# Patient Record
Sex: Female | Born: 1970 | Race: White | Hispanic: No | Marital: Married | Smoking: Never smoker
Health system: Southern US, Community
[De-identification: ages and names within clinical notes are randomized; demographics above are authoritative.]

## PROBLEM LIST (undated history)

## (undated) DIAGNOSIS — I471 Supraventricular tachycardia: Secondary | ICD-10-CM

## (undated) DIAGNOSIS — G4730 Sleep apnea, unspecified: Secondary | ICD-10-CM

## (undated) DIAGNOSIS — M1990 Unspecified osteoarthritis, unspecified site: Secondary | ICD-10-CM

## (undated) DIAGNOSIS — I10 Essential (primary) hypertension: Secondary | ICD-10-CM

## (undated) DIAGNOSIS — I4719 Other supraventricular tachycardia: Secondary | ICD-10-CM

## (undated) DIAGNOSIS — I447 Left bundle-branch block, unspecified: Secondary | ICD-10-CM

## (undated) DIAGNOSIS — E669 Obesity, unspecified: Secondary | ICD-10-CM

## (undated) DIAGNOSIS — I5032 Chronic diastolic (congestive) heart failure: Secondary | ICD-10-CM

## (undated) DIAGNOSIS — K219 Gastro-esophageal reflux disease without esophagitis: Secondary | ICD-10-CM

## (undated) DIAGNOSIS — K37 Unspecified appendicitis: Secondary | ICD-10-CM

## (undated) HISTORY — PX: ANTERIOR CRUCIATE LIGAMENT REPAIR: SHX115

## (undated) HISTORY — DX: Other supraventricular tachycardia: I47.19

## (undated) HISTORY — DX: Chronic diastolic (congestive) heart failure: I50.32

## (undated) HISTORY — DX: Sleep apnea, unspecified: G47.30

## (undated) HISTORY — PX: TONSILLECTOMY: SUR1361

## (undated) HISTORY — DX: Essential (primary) hypertension: I10

## (undated) HISTORY — DX: Left bundle-branch block, unspecified: I44.7

## (undated) HISTORY — DX: Supraventricular tachycardia: I47.1

## (undated) HISTORY — DX: Unspecified appendicitis: K37

## (undated) HISTORY — DX: Obesity, unspecified: E66.9

---

## 2000-06-12 ENCOUNTER — Encounter (INDEPENDENT_AMBULATORY_CARE_PROVIDER_SITE_OTHER): Payer: Self-pay | Admitting: Specialist

## 2000-06-12 ENCOUNTER — Ambulatory Visit (HOSPITAL_BASED_OUTPATIENT_CLINIC_OR_DEPARTMENT_OTHER): Admission: RE | Admit: 2000-06-12 | Discharge: 2000-06-12 | Payer: Self-pay | Admitting: *Deleted

## 2007-02-16 ENCOUNTER — Other Ambulatory Visit: Payer: Self-pay

## 2007-02-16 ENCOUNTER — Emergency Department: Payer: Self-pay | Admitting: Emergency Medicine

## 2007-02-18 ENCOUNTER — Ambulatory Visit: Payer: Self-pay | Admitting: Internal Medicine

## 2007-04-24 HISTORY — PX: ABLATION OF DYSRHYTHMIC FOCUS: SHX254

## 2010-09-05 NOTE — Progress Notes (Signed)
HEALTHCARE                  [facility]' OFFICE NOTE   NAME:[name] [name]                         MRN:          [id number]  DATE:02/18/2007                            DOB:          1970/07/20    [name] is referred today by Dr. Orson Gear from the [facility] office and Dr. Angelina Sheriff.  The patient is referred  today for evaluation of SVT.   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  Typically, these episodes last as long as 6-8  hours in duration, although sometimes they last only for a couple of  hours.  The patient was awakened by heart racing several days ago and  after the spells lasted for over five hours, she presented to the  emergency department, where she was found to be in SVT at a rate of 132  beats per minute.  She was initially treated with Adenosine, and  although I do not have all of the reports, it sounded like it was not  effective in terminating her tachycardia.  Ultimately, her heart racing  resolved, and she was discharged home.  The patient, because of high  blood pressure, in the past, and because of palpitations, has been on  Corazide.  She has not noted any particular improvement but has had  increased cough on this medication.  She also notes a remote history of  cough on ACE inhibitors, which were given to her secondary to  hypertension in the past.  She has never had syncope with this.  When  she goes into SVT, she feels some chest pressure and shortness of  breath, otherwise she has been stable.   The patient's past medical history is notable for remote tonsillectomy  and anterior cruciate ligament replacement on the  right back in 2005.   MEDICATIONS:  Corazide 20 mg daily.   FAMILY HISTORY:  Notable for a mother with hypertension, diabetes,  dyslipidemia, and now has multiple myeloma.  She has a father with an  aneurysm, which is being observed and followed.   SOCIAL HISTORY:  The patient is divorced.  She has one child.  She works  as a Radiation protection practitioner in [employer]  She denies tobacco use, recreational  drug use, or alcoholic beverages.   REVIEW OF SYSTEMS:  Really negative except for this chronic cough.  She  has longstanding obesity and has been overweight for many, many years.  Specifically, she denies loud snoring or difficulty with sleeping or  early morning fatigue.  The rest of her review of systems were negative.   PHYSICAL EXAMINATION:  She is a pleasant, obese 40 year old woman in no  acute distress.  Blood pressure today was 132/86.  The pulse was 72 and regular.  Respirations were 16.  The weight was 301 pounds.  HEENT:  Normocephalic and atraumatic.  Pupils are  equal and round.  Oropharynx is moist.  Sclerae are anicteric.  NECK:  No jugular venous distention.  There is no thyromegaly.  Trachea  is midline.  Carotids are 2+ and symmetric.  LUNGS:  Clear bilaterally to auscultation.  There are no wheezes, rales  or rhonchi present.  There is no increased work of breathing.  CARDIOVASCULAR:  Regular rate and rhythm with a normal S1 and S2.  The  PMI was not enlarged, nor was it laterally displaced.  ABDOMEN:  Soft, nontender, nondistended.  It was obese.  There was no  obvious organomegaly.  EXTREMITIES:  No clubbing, cyanosis or edema.  Pulses were 2+ and  symmetric.  NEUROLOGIC:  Alert and oriented x3.  Cranial nerves are intact.  Strength is 5/5 and symmetric.   EKG demonstrates sinus rhythm with normal axis and intervals.  Prior ECG  demonstrates atrial tachycardia with positive P wave morphology in lead  I, lead V1, and lead II.  Lead F was iso-electric.  Lead aVL was   positive.   IMPRESSION:  1. Recurrent episodes of supraventricular tachycardia.  2. Hypertension.  3. Obesity.   DISCUSSION:  I suspect that the patient's tachycardia is coming from the  high atrium or maybe the mid lateral right atrium.  She has not been on  much in the way of treatment for tachycardic palpitations today.  I have  asked that she switch her medications from the Corzide to Toprol XL 50  mg 1/2 tablet for one week, then a whole tablet daily.  Additional anti-  arrhythmic drugs would certainly be a consideration to this patient, as  would be catheter ablation.  I spent a fair amount of time today talking  about the risks, benefits, goals, and expectations of EP study and  catheter ablation.  For now, would recommend medical therapy alone to  see how she does.  I have also asked that she maintain a low sodium  diet.  I will plan to see the patient back in the office in three  months, sooner should she have worsening symptoms.     Doylene Canning. Ladona Ridgel, MD  Electronically Signed    GWT/MedQ  DD: 02/18/2007  DT: 02/18/2007  Job #: [id number]   cc:   Elease Hashimoto A. Benedetto Goad, M.D.  Rollene Rotunda, MD, [facility]

## 2010-09-05 NOTE — Letter (Signed)
February 18, 2007    Elease Hashimoto A. Benedetto Goad, M.D.  [address]  [place]  [place] [address]   RE:  [name], [name]  MRN:  [id number]  /  DOB:  07-25-1970   Dr. 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  The  patient has had palpitations off and on very infrequently and remotely  for the last several years but in the last several months has had  increasingly frequent episodes of tachy palpitations and was recently  documented to have SVT (atrial tachycardia) at rates of 130-140 beats  per minute.  Today we saw her in the office.  She has been on low-dose  beta blocker in conjunction with diuretics for this.  She continues to  have occasions symptoms.  Her exam was otherwise unremarkable except for  obesity and her EKG demonstrates intermittent episodes of atrial  tachycardia.   I discussed treatment options with the patient.  I have recommended for  now that we continue her on her beta blocker though we also discussed  the possibility of cath ablation if her  symptoms worsen of if she does not respond to medical therapy alone.  I  plan to see her back in several months, sooner should her symptoms  worsen.  I will keep you apprised as to how we deal with the patient.    Sincerely,      Doylene Canning. Ladona Ridgel, MD  Electronically Signed    GWT/MedQ  DD: 02/18/2007  DT: 02/18/2007  Job #: [id number]   CC:    Rollene Rotunda, MD, [facility]

## 2010-09-08 NOTE — Op Note (Signed)
[facility]  Patient:    [name] [name], [name] [name]                         MRN: [id number] Proc. Date: 06/12/00 Adm. Date:  [id number] Attending:  Aundria Mems                           Operative Report  PREOPERATIVE DIAGNOSIS:  Chronic obstructive hyperplastic adenotonsillitis.  POSTOPERATIVE DIAGNOSIS:  Chronic obstructive hyperplastic adenotonsillitis.  PROCEDURE:  Adenotonsillectomy.  DESCRIPTION OF PROCEDURE:  With the patient under general orotracheal anesthesia, the Crowe-Davis mouth gag was inserted and the patient put in the Haines position.  Inspection of the oral cavity revealed 4+ enlarged tonsils, nearly kissing in the midline with the mouth gag in place.  The soft palate was normal configuration.  The hard palate was intact to palpation.  Tonsils were nonpulsatile to palpation.  Mirror visualization of the nasopharynx revealed moderate adenoid tissue present, but decision was made to ablate the adenoid tissue with electrocautery rather than removal by curettage.  The left tonsil was grasped at the superior pole and removed from the fossa by electrical dissection, obtaining complete hemostasis with electrocautery.  The right tonsil was removed in similar fashion.  Red rubber catheter was passed through the left nasal chamber and used to elevate the soft palate.  Under mirror visualization with suction cautery, moderate adenoid tissue was completely ablated with suction cauterization and maintaining naturally complete hemostasis.  At completion of the case, the patient had prominent posterior pillars attaching to the base of the uvula bilaterally.  Given her body habitus and snoring and probable strong tendency toward sleep apnea, the triangular section of the upper posterior pillar muscle was excised, widening the free margin of the soft palate and widening the pharyngeal inlet between the palatopharyngeal pillars.  Blood loss for the procedure is  less than 10 cc.  The patient tolerated the procedure well, was taken to the recovery room in stable general condition. DD:  06/12/00 TD:  06/12/00 Job: [id number]

## 2011-12-17 ENCOUNTER — Encounter: Payer: Self-pay | Admitting: Cardiovascular Disease

## 2011-12-17 ENCOUNTER — Ambulatory Visit (INDEPENDENT_AMBULATORY_CARE_PROVIDER_SITE_OTHER): Payer: PRIVATE HEALTH INSURANCE | Admitting: Cardiovascular Disease

## 2011-12-17 VITALS — BP 160/108 | HR 87 | Ht 63.0 in | Wt 344.5 lb

## 2011-12-17 DIAGNOSIS — I471 Supraventricular tachycardia: Secondary | ICD-10-CM

## 2011-12-17 DIAGNOSIS — R002 Palpitations: Secondary | ICD-10-CM

## 2011-12-17 DIAGNOSIS — I4719 Other supraventricular tachycardia: Secondary | ICD-10-CM

## 2011-12-17 DIAGNOSIS — R0602 Shortness of breath: Secondary | ICD-10-CM

## 2011-12-17 DIAGNOSIS — I447 Left bundle-branch block, unspecified: Secondary | ICD-10-CM

## 2011-12-17 NOTE — Patient Instructions (Addendum)
Your physician has requested that you have an echocardiogram. Echocardiography is a painless test that uses sound waves to create images of your heart. It provides your doctor with information about the size and shape of your heart and how well your heart's chambers and valves are working. This procedure takes approximately one hour. There are no restrictions for this procedure.  Your physician has recommended that you wear an event monitor. Event monitors are medical devices that record the heart's electrical activity. Doctors most often us these monitors to diagnose arrhythmias. Arrhythmias are problems with the speed or rhythm of the heartbeat. The monitor is a small, portable device. You can wear one while you do your normal daily activities. This is usually used to diagnose what is causing palpitations/syncope (passing out).  Follow up after tests.  

## 2011-12-18 ENCOUNTER — Other Ambulatory Visit: Payer: Self-pay

## 2011-12-18 ENCOUNTER — Other Ambulatory Visit (INDEPENDENT_AMBULATORY_CARE_PROVIDER_SITE_OTHER): Payer: PRIVATE HEALTH INSURANCE

## 2011-12-18 DIAGNOSIS — R609 Edema, unspecified: Secondary | ICD-10-CM

## 2011-12-18 DIAGNOSIS — R0602 Shortness of breath: Secondary | ICD-10-CM

## 2011-12-19 ENCOUNTER — Encounter: Payer: Self-pay | Admitting: Cardiovascular Disease

## 2011-12-19 DIAGNOSIS — I471 Supraventricular tachycardia: Secondary | ICD-10-CM | POA: Insufficient documentation

## 2011-12-19 NOTE — Assessment & Plan Note (Signed)
The patient has a left bundle branch block which is concerning at such a young age. She had significant dyspnea and thus I will request an echocardiogram for evaluation.

## 2011-12-19 NOTE — Progress Notes (Signed)
HPI  This is a 41 year old female who is here today for evaluation of palpitations and an abnormal ECG. She lives at [facility] but works here with [employer]. She reports no history of tachycardia. In 2009 she was diagnosed with atrial tachycardia and underwent ablation by Dr. Chales Abrahams in [place]. According to the patient, the procedure failed and she was placed on flecainide, lisinopril and Toprol. 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 At that time, all her medications were stopped. She was told at that time that her echo and nuclear stress test were okay. She now reports frequent palpitations and fast heartbeats associated with significant dyspnea but no chest pain. One time while she was working, she corrected herself to a monitor while she was having palpitations. This was reviewed today and it appears that she was in sinus rhythm with left bundle branch block. She is morbidly obese with sleep apnea. She had recent labs performed which showed normal basic metabolic profile and electrolytes. Lipid profile showed a total cholesterol of 157 with an LDL of 86. Full thyroid panel was normal. Troponin was normal.  Allergies  Allergen Reactions  . Aspirin     swelling     Current Outpatient Prescriptions on File Prior to Visit  Medication Sig Dispense Refill  . omeprazole (PRILOSEC) 20 MG capsule Take 20 mg by mouth daily.         Past Medical History  Diagnosis Date  . Atrial tachycardia, paroxysmal     s/p ablation in 2009 by Dr. Chales Abrahams in [facility] (falied)  . LBBB (left bundle branch block)   . Obesity   . Sleep apnea      Past Surgical History  Procedure Date  . Tonsillectomy   . Anterior cruciate ligament repair   . Ablation of dysrhythmic focus 2009     Family History  Problem Relation Age of Onset  . Hyperlipidemia Mother   .  Hypertension Mother   . Hypertension Father      History   Social History  . Marital Status: Married    Spouse Name: N/A    Number of Children: N/A  . Years of Education: N/A   Occupational History  . Not on file.   Social History Main Topics  . Smoking status: Never Smoker   . Smokeless tobacco: Not on file  . Alcohol Use: No  . Drug Use: No  . Sexually Active:    Other Topics Concern  . Not on file   Social History Narrative  . No narrative on file     ROS Constitutional: Negative for fever, chills, diaphoresis, activity change, appetite change .  HENT: Negative for hearing loss, nosebleeds, congestion, sore throat, facial swelling, drooling, trouble swallowing, neck pain, voice change, sinus pressure and tinnitus.  Eyes: Negative for photophobia, pain, discharge and visual disturbance.  Respiratory: Negative for apnea, cough, chest tightness and wheezing.  Cardiovascular: Negative for chest pain  and leg swelling.  Gastrointestinal: Negative for nausea, vomiting, abdominal pain, diarrhea, constipation, blood in stool and abdominal distention.  Genitourinary: Negative for dysuria, urgency, frequency, hematuria and decreased urine volume.  Musculoskeletal: Negative for myalgias, back pain, joint swelling, arthralgias and gait problem.  Skin: Negative for color change, pallor, rash and wound.  Neurological: Negative for dizziness, tremors, seizures, syncope, speech difficulty, weakness, light-headedness, numbness and headaches.  Psychiatric/Behavioral: Negative  for suicidal ideas, hallucinations, behavioral problems and agitation. The patient is not nervous/anxious.     PHYSICAL EXAM   BP 160/108  Pulse 87  Ht 5\' 3"  (1.6 m)  Wt 344 lb 8 oz (156.264 kg)  BMI 61.03 kg/m2 Constitutional: She is oriented to person, place, and time. She appears well-developed and well-nourished. No distress.  HENT: No nasal discharge.  Head: Normocephalic and atraumatic.  Eyes:  Pupils are equal and round. Right eye exhibits no discharge. Left eye exhibits no discharge.  Neck: Normal range of motion. Neck supple. No JVD present. No thyromegaly present.  Cardiovascular: Normal rate, regular rhythm, normal heart sounds. Exam reveals no gallop and no friction rub. No murmur heard.  Pulmonary/Chest: Effort normal and breath sounds normal. No stridor. No respiratory distress. She has no wheezes. She has no rales. She exhibits no tenderness.  Abdominal: Soft. Bowel sounds are normal. She exhibits no distension. There is no tenderness. There is no rebound and no guarding.  Musculoskeletal: Normal range of motion. She exhibits no edema and no tenderness.  Neurological: She is alert and oriented to person, place, and time. Coordination normal.  Skin: Skin is warm and dry. No rash noted. She is not diaphoretic. No erythema. No pallor.  Psychiatric: She has a normal mood and affect. Her behavior is normal. Judgment and thought content normal.     EKG: Normal sinus rhythm with left bundle branch block.   ASSESSMENT AND PLAN

## 2011-12-19 NOTE — Assessment & Plan Note (Signed)
The patient has known history of atrial tachycardia with failed ablation in the past. She is currently having recurrent palpitations and tachycardia. These episodes are not happening on a daily basis. Thus, a Holter monitor most likely will be insufficient. Thus, I recommend a 14 day outpatient telemetry. The patient will likely require treatment with a beta blocker.

## 2011-12-21 ENCOUNTER — Telehealth: Payer: Self-pay

## 2011-12-21 NOTE — Telephone Encounter (Signed)
I rec'd t/c from [facility] re: abnormal tracing. They say pt shows SR with bigeminy x 34 beats. They have not contacted pt. [facility] will fax me tracings and I will call pt.

## 2011-12-21 NOTE — Telephone Encounter (Signed)
[facility] strips rev'd with Dr. Kirke Corin. No new orders rec'd. Bigeminy noted. No need to contact pt

## 2012-01-07 ENCOUNTER — Encounter: Payer: Self-pay | Admitting: Cardiovascular Disease

## 2012-01-07 ENCOUNTER — Ambulatory Visit (INDEPENDENT_AMBULATORY_CARE_PROVIDER_SITE_OTHER): Payer: PRIVATE HEALTH INSURANCE | Admitting: Cardiovascular Disease

## 2012-01-07 VITALS — BP 158/98 | HR 72 | Ht 63.0 in | Wt 342.8 lb

## 2012-01-07 DIAGNOSIS — I471 Supraventricular tachycardia, unspecified: Secondary | ICD-10-CM

## 2012-01-07 DIAGNOSIS — I10 Essential (primary) hypertension: Secondary | ICD-10-CM

## 2012-01-07 DIAGNOSIS — R002 Palpitations: Secondary | ICD-10-CM

## 2012-01-07 MED ORDER — CARVEDILOL 6.25 MG PO TABS: 6.2500 mg | ORAL_TABLET | Freq: Two times a day (BID) | ORAL | Status: DC

## 2012-01-07 NOTE — Progress Notes (Signed)
HPI  This is a 41 year old female who is here today for a followup visit regarding palpitations and an abnormal ECG. She lives at [facility] but works here with [employer]. She reports known history of tachycardia. In 2009 she was diagnosed with atrial tachycardia and underwent ablation by Dr. Chales Abrahams in [place]. According to the patient, the procedure failed and she was placed on flecainide, lisinopril and Toprol. 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 At that time, all her medications were stopped. She was told at that time that her echo and nuclear stress test were okay. She reported recent frequent palpitations and fast heartbeats associated with significant dyspnea but no chest pain. She had recent labs performed which showed normal basic metabolic profile and electrolytes. Lipid profile showed a total cholesterol of 157 with an LDL of 86. Full thyroid panel was normal. Troponin was normal. She had an echocardiogram done which showed normal LV systolic function with mild left ventricular hypertrophy. She had a 2 week outpatient telemetry which showed episodes of sinus tachycardia with occasional PACs and PVCs. No other significant arrhythmia was noted.  Allergies  Allergen Reactions  . Aspirin     swelling     Current Outpatient Prescriptions on File Prior to Visit  Medication Sig Dispense Refill  . omeprazole (PRILOSEC) 20 MG capsule Take 20 mg by mouth daily.      . carvedilol (COREG) 6.25 MG tablet Take 1 tablet (6.25 mg total) by mouth 2 (two) times daily.  60 tablet  6     Past Medical History  Diagnosis Date  . Atrial tachycardia, paroxysmal     s/p ablation in 2009 by Dr. Chales Abrahams in [facility] ([place])  . LBBB (left bundle branch block)   . Obesity   . Sleep apnea      Past Surgical History  Procedure Date  . Tonsillectomy   .  Anterior cruciate ligament repair   . Ablation of dysrhythmic focus 2009     Family History  Problem Relation Age of Onset  . Hyperlipidemia Mother   . Hypertension Mother   . Hypertension Father      History   Social History  . Marital Status: Married    Spouse Name: N/A    Number of Children: N/A  . Years of Education: N/A   Occupational History  . Not on file.   Social History Main Topics  . Smoking status: Never Smoker   . Smokeless tobacco: Not on file  . Alcohol Use: No  . Drug Use: No  . Sexually Active:    Other Topics Concern  . Not on file   Social History Narrative  . No narrative on file     PHYSICAL EXAM   BP 158/98  Pulse 72  Ht 5\' 3"  (1.6 m)  Wt 342 lb 12 oz (155.47 kg)  BMI 60.72 kg/m2  Constitutional: She is oriented to person, place, and time. She appears well-developed and well-nourished. No distress.  HENT: No nasal discharge.  Head: Normocephalic and atraumatic.  Eyes: Pupils are equal and round. Right eye exhibits no discharge. Left eye exhibits no discharge.  Neck: Normal range of motion. Neck supple. No JVD present. No thyromegaly present.  Cardiovascular: Normal rate, regular rhythm, normal heart sounds. Exam reveals no gallop and no friction rub. No murmur heard.  Pulmonary/Chest: Effort normal and  breath sounds normal. No stridor. No respiratory distress. She has no wheezes. She has no rales. She exhibits no tenderness.  Abdominal: Soft. Bowel sounds are normal. She exhibits no distension. There is no tenderness. There is no rebound and no guarding.  Musculoskeletal: Normal range of motion. She exhibits no edema and no tenderness.  Neurological: She is alert and oriented to person, place, and time. Coordination normal.  Skin: Skin is warm and dry. No rash noted. She is not diaphoretic. No erythema. No pallor.  Psychiatric: She has a normal mood and affect. Her behavior is normal. Judgment and thought content normal.    EKG:  Sinus  Rhythm  -Left bundle branch block.   ABNORMAL    ASSESSMENT AND PLAN

## 2012-01-07 NOTE — Assessment & Plan Note (Signed)
No evidence of atrial tachycardia on outpatient telemetry. She did have mild sinus tachycardia and occasional PACs and PVCs. Given that she appears to have hypertension, I will start treatment with carvedilol.

## 2012-01-07 NOTE — Assessment & Plan Note (Signed)
She appears to have essential hypertension. Her home blood pressure readings have been elevated as well. Echocardiogram showed normal LV systolic function with mild left ventricular hypertrophy. I am starting her on carvedilol 6.25 mg twice daily.

## 2012-01-07 NOTE — Patient Instructions (Addendum)
   Start Carvedilol 6.25 mg twice daily.   Follow up in 6 months.

## 2012-01-08 ENCOUNTER — Other Ambulatory Visit: Payer: Self-pay | Admitting: Cardiovascular Disease

## 2012-01-08 DIAGNOSIS — R002 Palpitations: Secondary | ICD-10-CM

## 2012-01-08 DIAGNOSIS — R0602 Shortness of breath: Secondary | ICD-10-CM

## 2012-01-21 ENCOUNTER — Other Ambulatory Visit: Payer: Self-pay

## 2012-01-21 ENCOUNTER — Telehealth: Payer: Self-pay | Admitting: Cardiovascular Disease

## 2012-01-21 DIAGNOSIS — R609 Edema, unspecified: Secondary | ICD-10-CM

## 2012-01-21 MED ORDER — FUROSEMIDE 20 MG PO TABS: 20.0000 mg | ORAL_TABLET | Freq: Every day | ORAL | Status: DC

## 2012-01-21 NOTE — Telephone Encounter (Signed)
She mentions being on triam/HCTZ in the past for fluid retention.  I told her I would discuss with Dr. Kirke Corin and call her back. Understanding verb.

## 2012-01-21 NOTE — Telephone Encounter (Signed)
Please see last 2 notes below and advise thanks

## 2012-01-21 NOTE — Telephone Encounter (Signed)
Pt calling with edema in her legs from her coreg. Pt also c/o SOB with excertion. BP however is running better.

## 2012-01-21 NOTE — Telephone Encounter (Signed)
Pt c/o increased edema since last week. Worked 24 hours last Thursday, edema got worse.  She stayed off her feet over w/e and edema improved only slightly. She says this is pitting.   She is also c/o DOE, noticed with the edema Only change is addition of coreg x 2 weeks ago Denies chest discomfort, but admits to some palpitations. 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

## 2012-01-21 NOTE — Telephone Encounter (Signed)
Pt informed Understanding verb New RX sent to pharm she asks if ok to have local lab do this at work on Monday 01/28/12. I advised ok. She will bring results to [place] next week. She will call us should her symptoms go unchanged/get worse

## 2012-01-21 NOTE — Telephone Encounter (Signed)
Start Lasix 20 mg once daily. Check BMP in 1 week.

## 2012-01-29 ENCOUNTER — Other Ambulatory Visit: Payer: Self-pay | Admitting: Cardiovascular Disease

## 2012-01-29 ENCOUNTER — Telehealth: Payer: Self-pay | Admitting: Cardiovascular Disease

## 2012-01-29 DIAGNOSIS — R609 Edema, unspecified: Secondary | ICD-10-CM

## 2012-01-29 NOTE — Telephone Encounter (Signed)
Pt worked every 4th day, 24 hour shifts. She asks if she can hold the lasix on those days she works. I advised this would be ok. She tells me lasix has helped edema and DOE. I gave her prelim. Lab results. She dropped these off today and they have been scanned in. Kidney fxn normal. Understanding verb.

## 2012-01-29 NOTE — Telephone Encounter (Signed)
Pt wants to know if she can cut back a [name] [name] on her lasix. 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

## 2012-06-07 ENCOUNTER — Other Ambulatory Visit: Payer: Self-pay

## 2012-07-17 ENCOUNTER — Ambulatory Visit (INDEPENDENT_AMBULATORY_CARE_PROVIDER_SITE_OTHER): Payer: PRIVATE HEALTH INSURANCE | Admitting: Cardiovascular Disease

## 2012-07-17 ENCOUNTER — Encounter: Payer: Self-pay | Admitting: Cardiovascular Disease

## 2012-07-17 VITALS — BP 120/86 | HR 73 | Ht 63.0 in | Wt 353.5 lb

## 2012-07-17 DIAGNOSIS — I471 Supraventricular tachycardia, unspecified: Secondary | ICD-10-CM

## 2012-07-17 DIAGNOSIS — I447 Left bundle-branch block, unspecified: Secondary | ICD-10-CM

## 2012-07-17 DIAGNOSIS — E669 Obesity, unspecified: Secondary | ICD-10-CM

## 2012-07-17 DIAGNOSIS — I10 Essential (primary) hypertension: Secondary | ICD-10-CM

## 2012-07-17 NOTE — Assessment & Plan Note (Signed)
Her blood pressure is now well controlled on current dose of carvedilol.

## 2012-07-17 NOTE — Progress Notes (Signed)
HPI  This is a 42 year old female who is here today for a followup visit regarding paroxysmal atrial tachycardia, hypertension and left bundle branch block. In 2009 she was diagnosed with atrial tachycardia and underwent ablation by Dr. Chales Abrahams in [place]. According to the patient, the procedure failed and she was placed on flecainide, lisinopril and Toprol. 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 At that time, all her medications were stopped. She was told at that time that her echo and nuclear stress test were okay.  She was seen last year for frequent palpitations associated with significant dyspnea but no chest pain. She had an echocardiogram done which showed normal LV systolic function with mild left ventricular hypertrophy. She had a 2 week outpatient telemetry which showed episodes of sinus tachycardia with occasional PACs and PVCs. No other significant arrhythmia was noted. I started her on treatment with carvedilol 6.25 mg twice daily. She had increased lower extremity edema the following weeks which responded to small dose furosemide 20 mg. She uses is now only as needed and has not required it in a while. She has been doing reasonably well. She denies any chest pain or dyspnea. She had one episode of tachycardia which lasted for about 10-15 minutes. She checked her heart rate with a pulse ox machine units was 150 beats per minute. The episode terminated spontaneously. No other episodes of tachycardia.  Allergies  Allergen Reactions  . Aspirin     swelling     Current Outpatient Prescriptions on File Prior to Visit  Medication Sig Dispense Refill  . carvedilol (COREG) 6.25 MG tablet Take 1 tablet (6.25 mg total) by mouth 2 (two) times daily.  60 tablet  6  . omeprazole (PRILOSEC) 20 MG capsule Take 20 mg by mouth daily.       No current facility-administered  medications on file prior to visit.     Past Medical History  Diagnosis Date  . Atrial tachycardia, paroxysmal     s/p ablation in 2009 by Dr. Chales Abrahams in [facility] (falied)  . LBBB (left bundle branch block)   . Obesity   . Sleep apnea   . Hypertension      Past Surgical History  Procedure Laterality Date  . Tonsillectomy    . Anterior cruciate ligament repair    . Ablation of dysrhythmic focus  2009     Family History  Problem Relation Age of Onset  . Hyperlipidemia Mother   . Hypertension Mother   . Hypertension Father      History   Social History  . Marital Status: Married    Spouse Name: N/A    Number of Children: N/A  . Years of Education: N/A   Occupational History  . Not on file.   Social History Main Topics  . Smoking status: Never Smoker   . Smokeless tobacco: Not on file  . Alcohol Use: No  . Drug Use: No  . Sexually Active:    Other Topics Concern  . Not on file   Social History Narrative  . No narrative on file     PHYSICAL EXAM   BP 120/86  Pulse 73  Ht 5\' 3"  (1.6 m)  Wt 353 lb 8 oz (160.347 kg)  BMI 62.64 kg/m2  Constitutional: She is oriented to person, place, and time. She appears well-developed and well-nourished. No distress.  HENT: No nasal discharge.  Head: Normocephalic and atraumatic.  Eyes: Pupils are equal and round. Right eye exhibits no discharge. Left eye exhibits no discharge.  Neck: Normal range of motion. Neck supple. No JVD present. No thyromegaly present.  Cardiovascular: Normal rate, regular rhythm, normal heart sounds. Exam reveals no gallop and no friction rub. No murmur heard.  Pulmonary/Chest: Effort normal and breath sounds normal. No stridor. No respiratory distress. She has no wheezes. She has no rales. She exhibits no tenderness.  Abdominal: Soft. Bowel sounds are normal. She exhibits no distension. There is no tenderness. There is no rebound and no guarding.  Musculoskeletal: Normal range of motion.  She exhibits no edema and no tenderness.  Neurological: She is alert and oriented to person, place, and time. Coordination normal.  Skin: Skin is warm and dry. No rash noted. She is not diaphoretic. No erythema. No pallor.  Psychiatric: She has a normal mood and affect. Her behavior is normal. Judgment and thought content normal.    EKG: Sinus  Rhythm  -Left bundle branch block.   ABNORMAL    ASSESSMENT AND PLAN

## 2012-07-17 NOTE — Assessment & Plan Note (Signed)
She only had one episode of tachycardia since her last visit. Continue treatment with carvedilol. If episodes of tachycardia become more frequent, I will consider an antiarrhythmic medication or referral to electrophysiology for possible ablation.

## 2012-07-17 NOTE — Patient Instructions (Addendum)
Continue same medications  Follow up in 1 year

## 2012-07-17 NOTE — Assessment & Plan Note (Signed)
Echocardiogram showed normal LV systolic function. I will consider repeat echocardiogram in one year.

## 2012-07-17 NOTE — Assessment & Plan Note (Signed)
I discussed with her the importance of weight loss. She is considering new diet. If that doesn't work, she might benefit from surgical weight loss.

## 2012-08-02 ENCOUNTER — Other Ambulatory Visit: Payer: Self-pay | Admitting: Cardiovascular Disease

## 2013-02-26 ENCOUNTER — Other Ambulatory Visit: Payer: Self-pay

## 2013-03-03 ENCOUNTER — Other Ambulatory Visit: Payer: Self-pay | Admitting: Cardiovascular Disease

## 2013-03-03 ENCOUNTER — Other Ambulatory Visit: Payer: Self-pay | Admitting: *Deleted

## 2013-03-03 MED ORDER — CARVEDILOL 6.25 MG PO TABS: ORAL_TABLET | ORAL | Status: DC

## 2013-03-03 NOTE — Telephone Encounter (Signed)
Requested Prescriptions   Signed Prescriptions Disp Refills  . carvedilol (COREG) 6.25 MG tablet 60 tablet 6    Sig: take 1 tablet by mouth twice a day    Authorizing Provider: Lorine Bears A    Ordering User: Kendrick Fries

## 2013-03-10 ENCOUNTER — Other Ambulatory Visit: Payer: Self-pay | Admitting: *Deleted

## 2013-03-10 ENCOUNTER — Other Ambulatory Visit: Payer: Self-pay | Admitting: Cardiovascular Disease

## 2013-03-10 MED ORDER — CARVEDILOL 6.25 MG PO TABS: ORAL_TABLET | ORAL | Status: DC

## 2013-03-10 NOTE — Telephone Encounter (Signed)
Requested Prescriptions   Signed Prescriptions Disp Refills  . carvedilol (COREG) 6.25 MG tablet 60 tablet 3    Sig: take 1 tablet by mouth twice a day    Authorizing Provider: Lorine Bears A    Ordering User: Kendrick Fries

## 2013-08-24 ENCOUNTER — Encounter: Payer: Self-pay | Admitting: Cardiovascular Disease

## 2013-08-24 ENCOUNTER — Ambulatory Visit (INDEPENDENT_AMBULATORY_CARE_PROVIDER_SITE_OTHER): Payer: PRIVATE HEALTH INSURANCE | Admitting: Cardiovascular Disease

## 2013-08-24 VITALS — BP 142/100 | HR 73 | Ht 63.0 in | Wt 360.2 lb

## 2013-08-24 DIAGNOSIS — I10 Essential (primary) hypertension: Secondary | ICD-10-CM

## 2013-08-24 DIAGNOSIS — I4719 Other supraventricular tachycardia: Secondary | ICD-10-CM

## 2013-08-24 DIAGNOSIS — I471 Supraventricular tachycardia: Secondary | ICD-10-CM

## 2013-08-24 DIAGNOSIS — I447 Left bundle-branch block, unspecified: Secondary | ICD-10-CM

## 2013-08-24 NOTE — Progress Notes (Signed)
HPI  This is a 43 year old female who is here today for a followup visit regarding paroxysmal atrial tachycardia, hypertension and left bundle branch block. In 2009 she was diagnosed with atrial tachycardia and underwent ablation by Dr. Chales Abrahamsyson in [place]. According to the patient, the procedure failed and she was placed on flecainide, lisinopril and Toprol. 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 At that time, all her medications were stopped.  She was seen in 2013 for frequent palpitations associated with significant dyspnea but no chest pain. She had an echocardiogram done which showed normal LV systolic function with mild left ventricular hypertrophy. She had a 2 week outpatient telemetry which showed episodes of sinus tachycardia with occasional PACs and PVCs. No other significant arrhythmia was noted. I started her on treatment with carvedilol for both hypertension and palpitations. She had increased lower extremity edema the following weeks which responded to small dose furosemide 20 mg. She uses is now only as needed and has not required it in a while.  She has been doing very well and denies any chest pain, worsening dyspnea or significant palpitations. She continues to struggle with obesity.   Allergies  Allergen Reactions  . Aspirin     swelling     Current Outpatient Prescriptions on File Prior to Visit  Medication Sig Dispense Refill  . carvedilol (COREG) 6.25 MG tablet take 1 tablet by mouth twice a day  60 tablet  3  . furosemide (LASIX) 20 MG tablet Take 20 mg by mouth as needed.      Marland Kitchen. omeprazole (PRILOSEC) 20 MG capsule Take 20 mg by mouth daily.       No current facility-administered medications on file prior to visit.     Past Medical History  Diagnosis Date  . Atrial tachycardia, paroxysmal     s/p ablation in 2009 [facility]  . LBBB (left bundle branch block)   . Obesity   . Sleep apnea   . Hypertension      Past Surgical History  Procedure Laterality Date  . Tonsillectomy    . Anterior cruciate ligament repair    . Ablation of dysrhythmic focus  2009     Family History  Problem Relation Age of Onset  . Hyperlipidemia Mother   . Hypertension Mother   . Hypertension Father      History   Social History  . Marital Status: Married    Spouse Name: N/A    Number of Children: N/A  . Years of Education: N/A   Occupational History  . Not on file.   Social History Main Topics  . Smoking status: Never Smoker   . Smokeless tobacco: Not on file  . Alcohol Use: No  . Drug Use: No  . Sexual Activity:    Other Topics Concern  . Not on file   Social History Narrative  . No narrative on file     PHYSICAL EXAM   BP 142/100  Pulse 73  Ht 5\' 3"  (1.6 m)  Wt 360 lb 4 oz (163.408 kg)  BMI 63.83 kg/m2  Constitutional: She is oriented to person, place, and time. She appears well-developed and well-nourished. No distress.  HENT: No nasal discharge.  Head: Normocephalic and atraumatic.  Eyes: Pupils are equal and round. Right eye exhibits no discharge. Left eye exhibits no discharge.  Neck: Normal range of motion. Neck supple. No JVD present. No thyromegaly present.  Cardiovascular: Normal rate, regular rhythm, normal heart sounds. Exam reveals no gallop and no friction rub. No murmur heard.  Pulmonary/Chest: Effort normal and breath sounds normal. No stridor. No respiratory distress. She has no wheezes. She has no rales. She exhibits no tenderness.  Abdominal: Soft. Bowel sounds are normal. She exhibits no distension. There is no tenderness. There is no rebound and no guarding.  Musculoskeletal: Normal range of motion. She exhibits no edema and no tenderness.  Neurological: She is alert and oriented to person, place, and time. Coordination normal.  Skin: Skin is warm and dry. No rash  noted. She is not diaphoretic. No erythema. No pallor.  Psychiatric: She has a normal mood and affect. Her behavior is normal. Judgment and thought content normal.    EKG: Sinus  Rhythm  -Left bundle branch block.   ABNORMAL    ASSESSMENT AND PLAN

## 2013-08-24 NOTE — Patient Instructions (Signed)
Continue same medications.   Your physician wants you to follow-up in: 12 months.  You will receive a reminder letter in the mail two months in advance. If you don't receive a letter, please call our office to schedule the follow-up appointment.  

## 2013-08-25 NOTE — Assessment & Plan Note (Signed)
She is doing well with no evidence of recurrent arrhythmia. Continue treatment with carvedilol.

## 2013-08-25 NOTE — Assessment & Plan Note (Signed)
This improved after the addition of carvedilol. Blood pressure is mildly elevated today. I asked her to monitor her blood pressure at home and notify us with high readings.

## 2013-08-25 NOTE — Assessment & Plan Note (Signed)
There has been no evidence of significant structural heart disease on echo. She has no symptoms of heart failure. Continue to monitor.

## 2013-11-16 ENCOUNTER — Telehealth: Payer: Self-pay

## 2013-11-16 ENCOUNTER — Emergency Department: Payer: Self-pay | Admitting: Emergency Medicine

## 2013-11-16 NOTE — Telephone Encounter (Signed)
Spoke w/ pt.  [facility] and was seen in Urgent Care there for sun poisoning.  She reports that she noticed a tender spot near her shin this am that is worsening.  Reports heat, pain and redness to almost purple in color.  Spoke w/ Dr. Kirke CorinArida who recommends that pt go to ED for evaluation.  She is agreeable and states that she is on her way to [facility] ED.

## 2013-11-16 NOTE — Telephone Encounter (Signed)
Pt called, states she thinks she has a blood clot in her right leg, states it is swollen, is hot, is turning purple. Please call.

## 2014-01-12 ENCOUNTER — Ambulatory Visit (INDEPENDENT_AMBULATORY_CARE_PROVIDER_SITE_OTHER): Payer: PRIVATE HEALTH INSURANCE | Admitting: Family Medicine

## 2014-01-12 ENCOUNTER — Encounter: Payer: Self-pay | Admitting: Family Medicine

## 2014-01-12 ENCOUNTER — Other Ambulatory Visit (INDEPENDENT_AMBULATORY_CARE_PROVIDER_SITE_OTHER): Payer: PRIVATE HEALTH INSURANCE

## 2014-01-12 VITALS — BP 134/82 | HR 79 | Ht 64.0 in | Wt 372.0 lb

## 2014-01-12 DIAGNOSIS — M79604 Pain in right leg: Secondary | ICD-10-CM

## 2014-01-12 DIAGNOSIS — M79609 Pain in unspecified limb: Secondary | ICD-10-CM

## 2014-01-12 DIAGNOSIS — M25569 Pain in unspecified knee: Secondary | ICD-10-CM | POA: Insufficient documentation

## 2014-01-12 DIAGNOSIS — M25561 Pain in right knee: Secondary | ICD-10-CM

## 2014-01-12 NOTE — Patient Instructions (Signed)
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  Consider stockings and do lasix days you are off.  Continue to montior, probably will not be pain frIee for another 3 months. Eucerin 3 times daily to area See me when you need me or if it gets worse.

## 2014-01-12 NOTE — Assessment & Plan Note (Addendum)
I believe patient's leg pain is secondary to a postinflammatory scar tissue. I do believe that there was a potential bike because patient had the cellulitis of the lower extremity. We discussed lotion and keeping significant friction from her lower [place]. We discussed the importance of taking her Lasix a regular basis as well as discuss compression stockings that will be beneficial. We discussed signs and symptoms of a recurrent infection that may be necessary to monitor. If patient has any worsening symptoms she will come back again. There is a chance that we could do a steroid injection into the scar tissue to help with healing but I do think with time and then continued to improve. No further imaging necessary today.

## 2014-01-12 NOTE — Progress Notes (Signed)
[facility] [address] [place], [place] [address] Phone: [phone] Subjective:    I'm seeing this patient by the request  of:    CC:   [id number] [name] [name] is a 43 y.o. female coming in with complaint of right ankle discomfort. Patient states multiple months ago patient unfortunately had a cellulitis. Patient was put on antibiotics it seemed to clear up after one week. Patient unfortunately and then had recurrent episodes it did not respond a first line treatment of antibiotics and needed to use proper coverage. Patient states that this occurred within a 2 hour period from nothing too severe redness. He did do tests and to rule out any type of deep venous thrombosis. Patient has just finished her antibiotics approximately 3 weeks ago. Patient has been doing relatively well but continues to have anterior ankle pain. Patient states there is an area that is very tender to palpation. Denies any redness or any other signs of infection at this time. Denies any radiation of pain or any numbness. Still able to do daily activities but is wondering what this could be and if there is anything that can be done. Rates the severity of 3/10. Patient is still able to do daily activities and denies any nighttime awakening.   t   Past medical history, social, surgical and family history all reviewed in electronic medical record.   Review of Systems: No headache, visual changes, nausea, vomiting, diarrhea, constipation, dizziness, abdominal pain, skin rash, fevers, chills, night sweats, weight loss, swollen lymph nodes, body aches, joint swelling, muscle aches, chest pain, shortness of breath, mood changes.   Objective Blood pressure 134/82, pulse 79, height  (1.626 m), weight 372 lb (168.738 kg), SpO2 97.00%.  General: No apparent distress alert and oriented x3 mood and affect normal, dressed appropriately. Obese.  HEENT: Pupils equal, extraocular movements intact    Respiratory: Patient's speak in full sentences and does not appear short of breath  Cardiovascular: +2 pitting edema of the lower extremity is up to the knees edema, non tender, no erythema  Skin: Warm dry intact with no signs of infection or rash on extremities or on axial skeleton.  Abdomen: Soft nontender  Neuro: Cranial nerves II through XII are intact, neurovascularly intact in all extremities with 2+ DTRs and 2+ pulses.  Lymph: No lymphadenopathy of posterior or anterior cervical chain or axillae bilaterally.  Gait normal with good balance and coordination.  MSK:  Non tender with full range of motion and good stability and symmetric strength and tone of shoulders, elbows, wrist, hip, kneebilaterally.  Patient's anterior ankle does not have any gross deformity. Patient does have an area that look like there is some mild skin breakdown compared to the rest the patient's skin. There is no signs of infection. In palpating this area and one can feel that there is some mild change in consistency of the subdermal is compared to the surrounding skin tissue. This is about quarter size in diameter. Patient is minimally tender to palpation. Full range of motion of ankle and neurovascularly intact distally.  Limited musculoskeletal ultrasound was performed and interpreted by me. Limited ultrasound of the area in question shows the patient does have some mild increasing Doppler flow. It appears the patient does have scar tissue surrounding a very small circular cystlike formation. No foreign body noted.  Impression: Postinflammatory scar tissue.    Impression and Recommendations:     This case required medical decision making of moderate  complexity.

## 2014-04-03 ENCOUNTER — Other Ambulatory Visit: Payer: Self-pay | Admitting: Cardiovascular Disease

## 2014-07-29 ENCOUNTER — Other Ambulatory Visit: Payer: Self-pay

## 2014-07-29 MED ORDER — CARVEDILOL 6.25 MG PO TABS: 6.2500 mg | ORAL_TABLET | Freq: Two times a day (BID) | ORAL | Status: DC

## 2014-07-29 NOTE — Telephone Encounter (Signed)
Refill sent for carvedilol 6.25 mg one tablet twice a day.

## 2014-08-26 ENCOUNTER — Ambulatory Visit (INDEPENDENT_AMBULATORY_CARE_PROVIDER_SITE_OTHER): Payer: No Typology Code available for payment source | Admitting: Cardiovascular Disease

## 2014-08-26 ENCOUNTER — Encounter: Payer: Self-pay | Admitting: Cardiovascular Disease

## 2014-08-26 VITALS — BP 148/90 | HR 79 | Ht 63.0 in | Wt 359.2 lb

## 2014-08-26 DIAGNOSIS — I4719 Other supraventricular tachycardia: Secondary | ICD-10-CM

## 2014-08-26 DIAGNOSIS — I447 Left bundle-branch block, unspecified: Secondary | ICD-10-CM

## 2014-08-26 DIAGNOSIS — I10 Essential (primary) hypertension: Secondary | ICD-10-CM | POA: Diagnosis not present

## 2014-08-26 DIAGNOSIS — I471 Supraventricular tachycardia: Secondary | ICD-10-CM | POA: Diagnosis not present

## 2014-08-26 DIAGNOSIS — E669 Obesity, unspecified: Secondary | ICD-10-CM | POA: Diagnosis not present

## 2014-08-26 MED ORDER — CARVEDILOL 12.5 MG PO TABS: 12.5000 mg | ORAL_TABLET | Freq: Two times a day (BID) | ORAL | Status: DC

## 2014-08-26 NOTE — Assessment & Plan Note (Signed)
She is going to try a new diet and increase exercise. I discussed with her the option of surgical weight loss. However, she is concerned that her insurance does not cover this.

## 2014-08-26 NOTE — Progress Notes (Signed)
HPI  This is a 44 year old female who is here today for a followup visit regarding paroxysmal atrial tachycardia, hypertension and left bundle branch block. In 2009 she was diagnosed with atrial tachycardia and underwent ablation by Dr. Chales Abrahamsyson in [place]. According to the patient, the procedure failed and she was placed on flecainide, lisinopril and Toprol. 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 At that time, all her medications were stopped.  She was seen in 2013 for frequent palpitations associated with significant dyspnea but no chest pain. She had an echocardiogram done which showed normal LV systolic function with mild left ventricular hypertrophy. She had a 2 week outpatient telemetry which showed episodes of sinus tachycardia with occasional PACs and PVCs. No other significant arrhythmia was noted. I started her on treatment with carvedilol for both hypertension and palpitations. She had increased lower extremity edema the following weeks which responded to small dose furosemide 20 mg. She uses is now only as needed and has not required it in a while.  She has been doing very well and denies any chest pain, worsening dyspnea or significant palpitations. She continues to struggle with obesity. Her weight is 360 pounds. Blood pressure was elevated during last visit and also today.   Allergies  Allergen Reactions  . Aspirin     swelling     Current Outpatient Prescriptions on File Prior to Visit  Medication Sig Dispense Refill  . carvedilol (COREG) 6.25 MG tablet Take 1 tablet (6.25 mg total) by mouth 2 (two) times daily. 60 tablet 3  . furosemide (LASIX) 20 MG tablet Take 20 mg by mouth as needed.    . meloxicam (MOBIC) 15 MG tablet Take 15 mg by mouth daily.    Marland Kitchen. omeprazole (PRILOSEC) 20 MG capsule Take 20 mg by mouth daily.     No current facility-administered  medications on file prior to visit.     Past Medical History  Diagnosis Date  . Atrial tachycardia, paroxysmal     s/p ablation in 2009 [place] [facility] ([place]  . LBBB (left bundle branch block)   . Obesity   . Sleep apnea   . Hypertension      Past Surgical History  Procedure Laterality Date  . Tonsillectomy    . Anterior cruciate ligament repair    . Ablation of dysrhythmic focus  2009     Family History  Problem Relation Age of Onset  . Hyperlipidemia Mother   . Hypertension Mother   . Hypertension Father      History   Social History  . Marital Status: Married    Spouse Name: N/A  . Number of Children: N/A  . Years of Education: N/A   Occupational History  . Not on file.   Social History Main Topics  . Smoking status: Never Smoker   . Smokeless tobacco: Not on file  . Alcohol Use: No  . Drug Use: No  . Sexual Activity: Not on file   Other Topics Concern  . Not on file   Social History Narrative     PHYSICAL EXAM   BP 148/90 mmHg  Pulse 79  Ht 5\' 3"  (1.6 m)  Wt 359 lb 4 oz (162.955 kg)  BMI 63.65 kg/m2  Constitutional: She is oriented to person, place, and time. She appears well-developed and well-nourished. No distress.  HENT: No  nasal discharge.  Head: Normocephalic and atraumatic.  Eyes: Pupils are equal and round. Right eye exhibits no discharge. Left eye exhibits no discharge.  Neck: Normal range of motion. Neck supple. No JVD present. No thyromegaly present.  Cardiovascular: Normal rate, regular rhythm, normal heart sounds. Exam reveals no gallop and no friction rub. No murmur heard.  Pulmonary/Chest: Effort normal and breath sounds normal. No stridor. No respiratory distress. She has no wheezes. She has no rales. She exhibits no tenderness.  Abdominal: Soft. Bowel sounds are normal. She exhibits no distension. There is no tenderness. There is no rebound and no guarding.  Musculoskeletal: Normal range of motion. She  exhibits no edema and no tenderness.  Neurological: She is alert and oriented to person, place, and time. Coordination normal.  Skin: Skin is warm and dry. No rash noted. She is not diaphoretic. No erythema. No pallor.  Psychiatric: She has a normal mood and affect. Her behavior is normal. Judgment and thought content normal.    EKG: Sinus  Rhythm  -Left bundle branch block.   ABNORMAL    ASSESSMENT AND PLAN

## 2014-08-26 NOTE — Patient Instructions (Signed)
Increase Carvedilol to 12.5 mg twice daily.   Your physician wants you to follow-up in: 1 year. You will receive a reminder letter in the mail two months in advance. If you don't receive a letter, please call our office to schedule the follow-up appointment.

## 2014-08-26 NOTE — Assessment & Plan Note (Signed)
She is doing well overall with no symptoms of arrhythmia. Continue treatment with carvedilol.

## 2014-08-26 NOTE — Assessment & Plan Note (Signed)
Blood pressure has been elevated. Thus, I increased the dose of carvedilol to 12.5 mg twice daily.

## 2015-02-07 ENCOUNTER — Ambulatory Visit: Payer: Self-pay | Admitting: Physician Assistant

## 2015-02-07 VITALS — BP 130/90 | HR 79 | Temp 98.8°F

## 2015-02-07 DIAGNOSIS — Z299 Encounter for prophylactic measures, unspecified: Secondary | ICD-10-CM

## 2015-02-07 MED ORDER — MELOXICAM 15 MG PO TABS: 15.0000 mg | ORAL_TABLET | Freq: Every day | ORAL | Status: DC

## 2015-02-07 NOTE — Progress Notes (Deleted)
   Subjective:    Patient ID: [name] PhoenixKristi [name], female    DOB: [birth year], 44 y.o.   MRN: [id number]  HPI Medication refill    Review of Systems     Objective:   Physical Exam        Assessment & Plan:

## 2015-02-07 NOTE — Progress Notes (Signed)
Medication refill  Subjective:    Patient ID: Earmon PhoenixKristi Mitchell, female    DOB: 18-Mar-1971, 44 y.o.   MRN: [id number]  HPI Patient request refill of Mobic for knee pain.    Review of Systems Obesity, Hypertension, and joint pain.     Objective:   Physical Exam Patient in no acute distress.  Morbid obesity. No knee deformity, marked crepitus with palpation.       Assessment & Plan:  Arthralgia bilaterl knee. Lab today. Reill mobic 15 mg Daily.

## 2015-02-08 LAB — CMP12+LP+TP+TSH+6AC+CBC/D/PLT
ALK PHOS: 100 IU/L (ref 39–117)
ALT: 22 IU/L (ref 0–32)
AST: 13 IU/L (ref 0–40)
Albumin/Globulin Ratio: 1.3 (ref 1.1–2.5)
Albumin: 3.8 g/dL (ref 3.5–5.5)
BASOS: 0 %
BUN/Creatinine Ratio: 19 (ref 9–23)
BUN: 13 mg/dL (ref 6–24)
Basophils Absolute: 0 10*3/uL (ref 0.0–0.2)
Bilirubin Total: 0.7 mg/dL (ref 0.0–1.2)
CHLORIDE: 100 mmol/L (ref 97–106)
CHOL/HDL RATIO: 2.7 ratio (ref 0.0–4.4)
Calcium: 8.9 mg/dL (ref 8.7–10.2)
Cholesterol, Total: 165 mg/dL (ref 100–199)
Creatinine, Ser: 0.7 mg/dL (ref 0.57–1.00)
EOS (ABSOLUTE): 0.1 10*3/uL (ref 0.0–0.4)
EOS: 1 %
Estimated CHD Risk: <0.5 times avg. (ref 0.0–1.0)
Free Thyroxine Index: 2.8 (ref 1.2–4.9)
GFR calc Af Amer: 120 mL/min/{1.73_m2} (ref >59)
GFR calc non Af Amer: 104 mL/min/{1.73_m2} (ref >59)
GGT: 20 IU/L (ref 0–60)
GLOBULIN, TOTAL: 3 g/dL (ref 1.5–4.5)
Glucose: 95 mg/dL (ref 65–99)
HDL: 62 mg/dL (ref >39)
HEMATOCRIT: 38.5 % (ref 34.0–46.6)
HEMOGLOBIN: 12.3 g/dL (ref 11.1–15.9)
Immature Grans (Abs): 0 10*3/uL (ref 0.0–0.1)
Immature Granulocytes: 0 %
Iron: 52 ug/dL (ref 27–159)
LDH: 179 IU/L (ref 119–226)
LDL CALC: 84 mg/dL (ref 0–99)
LYMPHS: 28 %
Lymphocytes Absolute: 2 10*3/uL (ref 0.7–3.1)
MCH: 25.5 pg — ABNORMAL LOW (ref 26.6–33.0)
MCHC: 31.9 g/dL (ref 31.5–35.7)
MCV: 80 fL (ref 79–97)
Monocytes Absolute: 0.5 10*3/uL (ref 0.1–0.9)
Monocytes: 7 %
NEUTROS ABS: 4.4 10*3/uL (ref 1.4–7.0)
Neutrophils: 64 %
POTASSIUM: 4.6 mmol/L (ref 3.5–5.2)
Phosphorus: 3.9 mg/dL (ref 2.5–4.5)
Platelets: 307 10*3/uL (ref 150–379)
RBC: 4.82 x10E6/uL (ref 3.77–5.28)
RDW: 15.6 % — ABNORMAL HIGH (ref 12.3–15.4)
SODIUM: 137 mmol/L (ref 136–144)
T3 Uptake Ratio: 29 % (ref 24–39)
T4, Total: 9.8 ug/dL (ref 4.5–12.0)
TRIGLYCERIDES: 96 mg/dL (ref 0–149)
TSH: 1.68 u[IU]/mL (ref 0.450–4.500)
Total Protein: 6.8 g/dL (ref 6.0–8.5)
Uric Acid: 5.1 mg/dL (ref 2.5–7.1)
VLDL CHOLESTEROL CAL: 19 mg/dL (ref 5–40)
WBC: 7 10*3/uL (ref 3.4–10.8)

## 2015-02-08 LAB — VITAMIN D 25 HYDROXY (VIT D DEFICIENCY, FRACTURES): Vit D, 25-Hydroxy: 24.3 ng/mL — ABNORMAL LOW (ref 30.0–100.0)

## 2015-02-10 NOTE — Progress Notes (Signed)
Labs normal, vit d just a little low take otc vit d, return to clinic as needed

## 2015-02-28 ENCOUNTER — Other Ambulatory Visit: Payer: Self-pay | Admitting: Physician Assistant

## 2015-03-07 ENCOUNTER — Other Ambulatory Visit: Payer: Self-pay | Admitting: Physician Assistant

## 2015-03-07 DIAGNOSIS — M2550 Pain in unspecified joint: Secondary | ICD-10-CM

## 2015-03-07 DIAGNOSIS — K219 Gastro-esophageal reflux disease without esophagitis: Secondary | ICD-10-CM

## 2015-03-07 MED ORDER — MELOXICAM 15 MG PO TABS: 15.0000 mg | ORAL_TABLET | Freq: Every day | ORAL | Status: DC

## 2015-03-07 MED ORDER — OMEPRAZOLE 20 MG PO CPDR: 20.0000 mg | DELAYED_RELEASE_CAPSULE | Freq: Every day | ORAL | Status: DC

## 2015-07-13 DIAGNOSIS — I10 Essential (primary) hypertension: Secondary | ICD-10-CM | POA: Insufficient documentation

## 2015-07-13 DIAGNOSIS — I447 Left bundle-branch block, unspecified: Secondary | ICD-10-CM | POA: Insufficient documentation

## 2015-08-02 ENCOUNTER — Telehealth: Payer: Self-pay | Admitting: Cardiovascular Disease

## 2015-08-02 NOTE — Telephone Encounter (Signed)
Patient says Dr. Margo CommonKrisinski started her on Neurontin and wants her to make sure this is ok with Dr. Kirke CorinArida.  Please call.

## 2015-08-03 NOTE — Telephone Encounter (Signed)
That is fine 

## 2015-08-03 NOTE — Telephone Encounter (Signed)
Left message on pt's vm w/ Dr. Jari SportsmanArida's recommendation.  Asked her to call back w/ any questions or concerns.

## 2015-08-19 ENCOUNTER — Ambulatory Visit: Payer: Self-pay | Admitting: Physician Assistant

## 2015-08-19 ENCOUNTER — Encounter: Payer: Self-pay | Admitting: Physician Assistant

## 2015-08-19 VITALS — BP 125/80 | HR 82 | Temp 98.4°F

## 2015-08-19 DIAGNOSIS — J0120 Acute ethmoidal sinusitis, unspecified: Secondary | ICD-10-CM

## 2015-08-19 NOTE — Progress Notes (Signed)
   Subjective: Cough/contestion    Patient ID: [name] [name], female    DOB: 08/13/1970, 45 y.o.   MRN: [id number]  HPI Patient c/o 4 days nasal congestion and greenish productive cough.  Currently using Flonase.Denies fever/chill, or N/V/D.    Review of Systems Obesity, LBBB, HTN Atrial tachycardia.    Objective:   Physical Exam NAD, bilateral nasal turbinates, post nasal drainage, and productive cough.  Neck supple. Lungs with biateral upper lobe Rales. Heart RRR.       Assessment & Plan:Sinusitis/ Bronchitis  Bactrim DS and Phenergan DM.  Follow 3 days if no improvement.

## 2015-08-26 ENCOUNTER — Other Ambulatory Visit: Payer: Self-pay | Admitting: Cardiovascular Disease

## 2015-08-30 ENCOUNTER — Ambulatory Visit (INDEPENDENT_AMBULATORY_CARE_PROVIDER_SITE_OTHER): Payer: Managed Care, Other (non HMO) | Admitting: Cardiovascular Disease

## 2015-08-30 ENCOUNTER — Encounter: Payer: Self-pay | Admitting: Cardiovascular Disease

## 2015-08-30 VITALS — BP 128/82 | HR 86 | Ht 63.0 in | Wt 376.5 lb

## 2015-08-30 DIAGNOSIS — I447 Left bundle-branch block, unspecified: Secondary | ICD-10-CM | POA: Diagnosis not present

## 2015-08-30 DIAGNOSIS — I471 Supraventricular tachycardia: Secondary | ICD-10-CM

## 2015-08-30 DIAGNOSIS — I10 Essential (primary) hypertension: Secondary | ICD-10-CM | POA: Diagnosis not present

## 2015-08-30 NOTE — Patient Instructions (Signed)
Medication Instructions: Continue same medications.   Labwork: None.   Procedures/Testing: None.   Follow-Up: 1 year with Dr. Arida.   Any Additional Special Instructions Will Be Listed Below (If Applicable).     If you need a refill on your cardiac medications before your next appointment, please call your pharmacy.   

## 2015-08-30 NOTE — Progress Notes (Signed)
Cardiology Office Note   Date:  08/30/2015   ID:  [name] [name], DOB 29-Aug-1970, MRN [id number]  PCP:  Dina Rich, MD  Cardiologist:   Lorine Bears, MD   Chief Complaint  Patient presents with  . other    12 month follow up. "doing well."       History of Present Illness: [name] [name] is a 45 y.o. female who presents for a followup visit regarding paroxysmal atrial tachycardia, hypertension and left bundle branch block. In 2009 she was diagnosed with atrial tachycardia and underwent ablation by Dr. Chales Abrahams in [place]. According to the patient, the procedure failed and she was placed on flecainide, lisinopril and Toprol. 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 At that time, all her medications were stopped.  She was seen in 2013 for frequent palpitations associated with significant dyspnea but no chest pain. She had an echocardiogram done which showed normal LV systolic function with mild left ventricular hypertrophy. She had a 2 week outpatient telemetry which showed episodes of sinus tachycardia with occasional PACs and PVCs. No other significant arrhythmia was noted. I started her on treatment with carvedilol for both hypertension and palpitations. She had increased lower extremity edema the following weeks which responded to small dose furosemide 20 mg to be used as needed. She has known history of sleep apnea on CPAP. She continues to have problems with weight gain and morbid obesity. Her weight today is 376 pounds. Unfortunately, her insurance does not cover weight loss surgery.  Past Medical History  Diagnosis Date  . Atrial tachycardia, paroxysmal [facility])     s/p ablation in 2009 by Dr. Chales Abrahams in [facility] (falied)  . LBBB (left bundle branch block)   . Obesity   . Sleep apnea   . Hypertension     Past Surgical History  Procedure Laterality Date  .  Tonsillectomy    . Anterior cruciate ligament repair    . Ablation of dysrhythmic focus  2009     Current Outpatient Prescriptions  Medication Sig Dispense Refill  . carvedilol (COREG) 12.5 MG tablet take 1 tablet by mouth twice a day 60 tablet 3  . furosemide (LASIX) 20 MG tablet Take 20 mg by mouth as needed.    . gabapentin (NEURONTIN) 300 MG capsule Reported on 08/30/2015  0  . meloxicam (MOBIC) 15 MG tablet Take 1 tablet (15 mg total) by mouth daily. 30 tablet 0  . omeprazole (PRILOSEC) 20 MG capsule Take 1 capsule (20 mg total) by mouth daily. 30 capsule 12   No current facility-administered medications for this visit.    Allergies:   Aspirin    Social History:  The patient  reports that she has never smoked. She does not have any smokeless tobacco history on file. She reports that she does not drink alcohol or use illicit drugs.   Family History:  The patient's family history includes Hyperlipidemia in her mother; Hypertension in her father and mother.    ROS:  Please see the history of present illness.   Otherwise, review of systems are positive for none.   All other systems are reviewed and negative.    PHYSICAL EXAM: VS:  BP 128/82 mmHg  Pulse 86  Ht  (1.6 m)  Wt 376 lb 8 oz (170.779 kg)  BMI 66.71 kg/m2  SpO2 98% , BMI Body mass index is  66.71 kg/(m^2). GEN: Well nourished, well developed, in no acute distress HEENT: normal Neck: no JVD, carotid bruits, or masses Cardiac: RRR; no murmurs, rubs, or gallops,no edema  Respiratory:  clear to auscultation bilaterally, normal work of breathing GI: soft, nontender, nondistended, + BS MS: no deformity or atrophy Skin: warm and dry, no rash Neuro:  Strength and sensation are intact Psych: euthymic mood, full affect   EKG:  EKG is ordered today. The ekg ordered today demonstrates normal sinus rhythm with left bundle branch block.   Recent Labs: 02/07/2015: ALT 22; BUN 13; Creatinine, Ser 0.70; Platelets 307;  Potassium 4.6; Sodium 137; TSH 1.680    Lipid Panel    Component Value Date/Time   CHOL 165 02/07/2015 1012   TRIG 96 02/07/2015 1012   HDL 62 02/07/2015 1012   CHOLHDL 2.7 02/07/2015 1012   LDLCALC 84 02/07/2015 1012      Wt Readings from Last 3 Encounters:  08/30/15 376 lb 8 oz (170.779 kg)  08/26/14 359 lb 4 oz (162.955 kg)  01/12/14 372 lb (168.738 kg)       ASSESSMENT AND PLAN:  1.  Paroxysmal atrial tachycardia: No evidence of recurrent arrhythmia. Continue current dose of carvedilol.  2. Essential hypertension: Blood pressure is well controlled since she has been on the current dose of carvedilol.  3. Morbid obesity: We again discussed different means of weight loss.  4. Obstructive sleep apnea: Continue treatment with CPAP.    Disposition:   FU with me in 1 year  Signed,  Lorine BearsMuhammad Avah Bashor, MD  08/30/2015 2:48 PM    [facility]

## 2015-09-14 ENCOUNTER — Ambulatory Visit: Payer: Self-pay | Admitting: Physician Assistant

## 2015-09-14 ENCOUNTER — Encounter: Payer: Self-pay | Admitting: Physician Assistant

## 2015-09-14 VITALS — BP 130/80 | HR 71 | Temp 98.8°F

## 2015-09-14 DIAGNOSIS — J069 Acute upper respiratory infection, unspecified: Secondary | ICD-10-CM

## 2015-09-14 MED ORDER — AMOXICILLIN 875 MG PO TABS: 875.0000 mg | ORAL_TABLET | Freq: Two times a day (BID) | ORAL | Status: DC

## 2015-09-14 MED ORDER — PREDNISONE 10 MG PO TABS: 30.0000 mg | ORAL_TABLET | Freq: Every day | ORAL | Status: DC

## 2015-09-14 NOTE — Progress Notes (Signed)
S: C/o runny nose and congestion for 3 days, cough for 3 weeks, was seen and given bactrim, helped a little but never cleared up completely, no fever, chills, cp/sob, v/d; mucus is green and thick, cough is sporadic, c/o of facial and dental pain. Some wheezing, using inhaler and nasonex  Using otc meds:   O: PE: vitals wnl, nad, perrl eomi, normocephalic, tms dull, nasal mucosa red and swollen, a little boggy anteriorly, throat injected, neck supple no lymph, lungs c t a, cv rrr, neuro intact  A:  Acute uri/sinusitis   P: amoxil 875mg  bid x 10d, prednisone 30mg  qd x 3d, drink fluids, continue regular meds , use otc meds of choice, return if not improving in 5 days, return earlier if worsening

## 2015-12-18 ENCOUNTER — Other Ambulatory Visit: Payer: Self-pay | Admitting: Cardiovascular Disease

## 2016-02-15 ENCOUNTER — Ambulatory Visit: Payer: Self-pay | Admitting: Physician Assistant

## 2016-02-15 ENCOUNTER — Encounter: Payer: Self-pay | Admitting: Physician Assistant

## 2016-02-15 VITALS — BP 148/100 | HR 76 | Temp 98.2°F

## 2016-02-15 DIAGNOSIS — S91301A Unspecified open wound, right foot, initial encounter: Secondary | ICD-10-CM

## 2016-02-15 MED ORDER — TETANUS-DIPHTH-ACELL PERTUSSIS 5-2.5-18.5 LF-MCG/0.5 IM SUSP
0.5000 mL | Freq: Once | INTRAMUSCULAR | Status: AC
  Administered 2016-02-15: 0.5 mL via INTRAMUSCULAR
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
## 2016-02-17 ENCOUNTER — Encounter: Payer: Self-pay | Admitting: Physician Assistant

## 2016-02-17 ENCOUNTER — Ambulatory Visit: Payer: Self-pay | Admitting: Physician Assistant

## 2016-02-17 VITALS — BP 140/79 | HR 70 | Temp 98.4°F

## 2016-02-17 DIAGNOSIS — Z5189 Encounter for other specified aftercare: Secondary | ICD-10-CM

## 2016-02-17 NOTE — Progress Notes (Signed)
S: here for wound recheck, states is feeling a little better, is oozing a little, no fever/chills  O: vitals wnl, nad, afebrile, r foot with improved wound, some skin layer in place at this time where there was none 2 days ago, mild serous driainage, n/v intact, no redness pus or swelling noted  A: wound recheck   P: dressing applied with vaseline gauze and regular gauze, f/u mid week of next week for eval, still should not work as cannot put on a boot or put pressure on foot for extended shift

## 2016-02-22 ENCOUNTER — Ambulatory Visit: Payer: Self-pay | Admitting: Physician Assistant

## 2016-02-22 ENCOUNTER — Encounter: Payer: Self-pay | Admitting: Physician Assistant

## 2016-02-22 VITALS — BP 150/90 | HR 82 | Temp 98.9°F

## 2016-02-22 DIAGNOSIS — S91301D Unspecified open wound, right foot, subsequent encounter: Secondary | ICD-10-CM

## 2016-02-22 NOTE — Progress Notes (Signed)
S: here for recheck of foot, states its better but still sore, was able to stand on it for a few minutes yesterday, painful to walk, no drainage  O: vitals wnl, nad, area on foot improving, no drainage, more skin layer noted, n/v intact  A: wound recheck  P: leave area open to air, will continue to stay out of work this week as unable to wear a boot and work 24 hour shift, will recheck on Monday if not better

## 2016-03-23 ENCOUNTER — Other Ambulatory Visit: Payer: Self-pay | Admitting: Physician Assistant

## 2016-03-23 DIAGNOSIS — K219 Gastro-esophageal reflux disease without esophagitis: Secondary | ICD-10-CM

## 2016-03-23 NOTE — Telephone Encounter (Signed)
Med refill approved, pt needs yearly labs prior to additional refills

## 2016-03-30 ENCOUNTER — Ambulatory Visit: Payer: Self-pay | Admitting: Physician Assistant

## 2016-03-30 ENCOUNTER — Encounter: Payer: Self-pay | Admitting: Physician Assistant

## 2016-03-30 VITALS — BP 150/90 | HR 77 | Temp 98.7°F

## 2016-03-30 DIAGNOSIS — R519 Headache, unspecified: Secondary | ICD-10-CM

## 2016-03-30 DIAGNOSIS — R51 Headache: Secondary | ICD-10-CM

## 2016-03-30 DIAGNOSIS — J0120 Acute ethmoidal sinusitis, unspecified: Secondary | ICD-10-CM

## 2016-03-30 NOTE — Progress Notes (Signed)
   Subjective:sinus congestion    Patient ID: [name] [name], female    DOB: 1970/10/13, 45 y.o.   MRN: [id number]  HPI Patient c/o one wek of sinus congestion and frontal headache. Also c.o sore throat 2nd to post nasal drainage. Denies fever/chill, or N/V/D.   Review of Systems HTN    Objective:   Physical Exam HEENT for bilateral maxillary and frontal sinus guarding. Edematous nasal turbinates. Copious post nasal drainage. Neck supple, Lungs CTA, and Heart RRR.        Assessment & Plan:Sinusitis and Sinus Headace.  Amoxil and Esgic. Follow up PRN

## 2016-04-20 ENCOUNTER — Other Ambulatory Visit: Payer: Self-pay | Admitting: Cardiovascular Disease

## 2016-05-28 ENCOUNTER — Encounter: Payer: Self-pay | Admitting: Physician Assistant

## 2016-05-28 ENCOUNTER — Ambulatory Visit: Payer: Self-pay | Admitting: Physician Assistant

## 2016-05-28 VITALS — BP 139/85 | HR 66 | Temp 98.9°F

## 2016-05-28 DIAGNOSIS — J0100 Acute maxillary sinusitis, unspecified: Secondary | ICD-10-CM

## 2016-05-28 MED ORDER — AMOXICILLIN 875 MG PO TABS: 875.0000 mg | ORAL_TABLET | Freq: Two times a day (BID) | ORAL | 0 refills | Status: DC

## 2016-05-28 MED ORDER — FLUTICASONE PROPIONATE 50 MCG/ACT NA SUSP: 2.0000 | Freq: Every day | NASAL | 6 refills | Status: DC

## 2016-05-28 NOTE — Progress Notes (Signed)
S: C/o runny nose and congestion for 3 days, no fever, chills, cp/sob, v/d; mucus is green and thick, cough is sporadic, c/o of facial and dental pain. Doesn't feel like its the flu, just a sinus infection  Using otc meds: flonase  O: PE: vitals wnl, nad, perrl eomi, normocephalic, tms dull, nasal mucosa red and swollen, throat injected, neck supple no lymph, lungs c t a, cv rrr, neuro intact  A:  Acute sinusitis   P: drink fluids, continue regular meds , use otc meds of choice, return if not improving in 5 days, return earlier if worsening , amoxil 875mg  bid, flonase

## 2016-06-08 ENCOUNTER — Ambulatory Visit: Payer: Self-pay | Admitting: Physician Assistant

## 2016-06-08 VITALS — BP 139/80 | HR 69 | Temp 98.5°F

## 2016-06-08 DIAGNOSIS — H6983 Other specified disorders of Eustachian tube, bilateral: Secondary | ICD-10-CM

## 2016-06-08 NOTE — Progress Notes (Signed)
S:  Just finished 10days of amoxil and felt better until yesterday at work her throat felt "funny".  No problems breathing or talking.  Occasional sneeze, rare cough and no known fever.   O: TM's dull with fluid but no injection.  Ears will not pop.  Nose pale and swollen bilateral.  Throat with post drainage.         Neck supple without aden.  Lungs clear bilat.   A: Eustachian tube dysfunction P:  Pt had dc'd her flonase but has it at home.  Will start back on this and if not improved by Monday will call in Prednisone for her.

## 2016-06-11 NOTE — Progress Notes (Signed)
Patient called and expressed that she wasn't getting any better.  She wanted to know if we could call in a steroid pack. I informed Darl PikesSusan and she authorized me to call in Medrol Dose pack to [facility] [place], [place].  Patient has been notified.

## 2016-08-16 ENCOUNTER — Encounter: Payer: Self-pay | Admitting: Emergency Medicine

## 2016-08-16 ENCOUNTER — Encounter: Payer: Self-pay | Admitting: Physician Assistant

## 2016-08-16 ENCOUNTER — Observation Stay
Admission: EM | Admit: 2016-08-16 | Discharge: 2016-08-17 | Disposition: A | Discharge status: Home / Self Care | Payer: Managed Care, Other (non HMO) | Attending: Surgery | Admitting: Surgery

## 2016-08-16 ENCOUNTER — Ambulatory Visit: Payer: Self-pay | Admitting: Physician Assistant

## 2016-08-16 ENCOUNTER — Observation Stay: Payer: Managed Care, Other (non HMO) | Admitting: Certified Registered Nurse Anesthetist

## 2016-08-16 ENCOUNTER — Encounter
Admission: EM | Disposition: A | Discharge status: Home / Self Care | Payer: Self-pay | Source: Home / Self Care | Attending: Emergency Medicine

## 2016-08-16 ENCOUNTER — Emergency Department: Payer: Managed Care, Other (non HMO)

## 2016-08-16 VITALS — BP 140/80 | HR 83 | Temp 98.7°F

## 2016-08-16 DIAGNOSIS — I471 Supraventricular tachycardia: Secondary | ICD-10-CM | POA: Diagnosis not present

## 2016-08-16 DIAGNOSIS — Z79899 Other long term (current) drug therapy: Secondary | ICD-10-CM | POA: Insufficient documentation

## 2016-08-16 DIAGNOSIS — Z791 Long term (current) use of non-steroidal anti-inflammatories (NSAID): Secondary | ICD-10-CM | POA: Insufficient documentation

## 2016-08-16 DIAGNOSIS — Z7951 Long term (current) use of inhaled steroids: Secondary | ICD-10-CM | POA: Insufficient documentation

## 2016-08-16 DIAGNOSIS — I10 Essential (primary) hypertension: Secondary | ICD-10-CM | POA: Diagnosis not present

## 2016-08-16 DIAGNOSIS — G4730 Sleep apnea, unspecified: Secondary | ICD-10-CM | POA: Insufficient documentation

## 2016-08-16 DIAGNOSIS — K3580 Unspecified acute appendicitis: Secondary | ICD-10-CM | POA: Diagnosis not present

## 2016-08-16 DIAGNOSIS — K37 Unspecified appendicitis: Secondary | ICD-10-CM

## 2016-08-16 DIAGNOSIS — K353 Acute appendicitis with localized peritonitis, without perforation or gangrene: Secondary | ICD-10-CM

## 2016-08-16 DIAGNOSIS — Z6841 Body Mass Index (BMI) 40.0 and over, adult: Secondary | ICD-10-CM | POA: Diagnosis not present

## 2016-08-16 DIAGNOSIS — R1031 Right lower quadrant pain: Secondary | ICD-10-CM

## 2016-08-16 HISTORY — DX: Unspecified appendicitis: K37

## 2016-08-16 HISTORY — PX: LAPAROSCOPIC APPENDECTOMY: SHX408

## 2016-08-16 LAB — URINALYSIS, COMPLETE (UACMP) WITH MICROSCOPIC
BILIRUBIN URINE: NEGATIVE
Bacteria, UA: NONE SEEN
Glucose, UA: NEGATIVE mg/dL
Hgb urine dipstick: NEGATIVE
Ketones, ur: NEGATIVE mg/dL
LEUKOCYTES UA: NEGATIVE
NITRITE: NEGATIVE
PH: 5 (ref 5.0–8.0)
Protein, ur: NEGATIVE mg/dL
RBC / HPF: NONE SEEN RBC/hpf (ref 0–5)
SPECIFIC GRAVITY, URINE: 1.024 (ref 1.005–1.030)

## 2016-08-16 LAB — POCT URINALYSIS DIPSTICK
Bilirubin, UA: NEGATIVE
Blood, UA: NEGATIVE
GLUCOSE UA: NEGATIVE
Ketones, UA: NEGATIVE
LEUKOCYTES UA: NEGATIVE
Nitrite, UA: NEGATIVE
Protein, UA: NEGATIVE
SPEC GRAV UA: 1.01 (ref 1.010–1.025)
UROBILINOGEN UA: 1 U/dL
pH, UA: 5.5 (ref 5.0–8.0)

## 2016-08-16 LAB — PREGNANCY, URINE: Preg Test, Ur: NEGATIVE

## 2016-08-16 LAB — CBC
HCT: 38.7 % (ref 35.0–47.0)
Hemoglobin: 12.8 g/dL (ref 12.0–16.0)
MCH: 27.2 pg (ref 26.0–34.0)
MCHC: 33 g/dL (ref 32.0–36.0)
MCV: 82.3 fL (ref 80.0–100.0)
PLATELETS: 254 10*3/uL (ref 150–440)
RBC: 4.71 MIL/uL (ref 3.80–5.20)
RDW: 15 % — AB (ref 11.5–14.5)
WBC: 13.9 10*3/uL — AB (ref 3.6–11.0)

## 2016-08-16 LAB — COMPREHENSIVE METABOLIC PANEL
ALT: 21 U/L (ref 14–54)
AST: 19 U/L (ref 15–41)
Albumin: 3.6 g/dL (ref 3.5–5.0)
Alkaline Phosphatase: 70 U/L (ref 38–126)
Anion gap: 8 (ref 5–15)
BILIRUBIN TOTAL: 1 mg/dL (ref 0.3–1.2)
BUN: 12 mg/dL (ref 6–20)
CO2: 27 mmol/L (ref 22–32)
CREATININE: 0.54 mg/dL (ref 0.44–1.00)
Calcium: 8.9 mg/dL (ref 8.9–10.3)
Chloride: 101 mmol/L (ref 101–111)
GFR calc Af Amer: >60 mL/min (ref >60)
Glucose, Bld: 110 mg/dL — ABNORMAL HIGH (ref 65–99)
Potassium: 4 mmol/L (ref 3.5–5.1)
Sodium: 136 mmol/L (ref 135–145)
TOTAL PROTEIN: 7.1 g/dL (ref 6.5–8.1)

## 2016-08-16 LAB — SURGICAL PCR SCREEN
MRSA, PCR: NEGATIVE
Staphylococcus aureus: NEGATIVE

## 2016-08-16 LAB — LIPASE, BLOOD: Lipase: 23 U/L (ref 11–51)

## 2016-08-16 SURGERY — APPENDECTOMY, LAPAROSCOPIC
Anesthesia: General

## 2016-08-16 MED ORDER — PIPERACILLIN-TAZOBACTAM 3.375 G IVPB
3.3750 g | Freq: Three times a day (TID) | INTRAVENOUS | Status: DC
Start: 2016-08-16 — End: 2016-08-16

## 2016-08-16 MED ORDER — PIPERACILLIN-TAZOBACTAM 3.375 G IVPB
3.3750 g | Freq: Three times a day (TID) | INTRAVENOUS | Status: DC
  Administered 2016-08-16 – 2016-08-17 (×3): 3.375 g via INTRAVENOUS
  Filled 2016-08-16 (×7): qty 50

## 2016-08-16 MED ORDER — IOPAMIDOL (ISOVUE-300) INJECTION 61%
30.0000 mL | Freq: Once | INTRAVENOUS | Status: AC
  Administered 2016-08-16: 30 mL via ORAL
  Filled 2016-08-16: qty 30

## 2016-08-16 MED ORDER — BUPIVACAINE-EPINEPHRINE (PF) 0.25% -1:200000 IJ SOLN
INTRAMUSCULAR | Status: DC | PRN
  Administered 2016-08-16: 30 mL

## 2016-08-16 MED ORDER — FENTANYL CITRATE (PF) 100 MCG/2ML IJ SOLN
25.0000 ug | INTRAMUSCULAR | Status: DC | PRN
  Administered 2016-08-16 (×2): 25 ug via INTRAVENOUS

## 2016-08-16 MED ORDER — ONDANSETRON HCL 4 MG/2ML IJ SOLN
INTRAMUSCULAR | Status: DC | PRN
  Administered 2016-08-16: 4 mg via INTRAVENOUS

## 2016-08-16 MED ORDER — ROCURONIUM BROMIDE 100 MG/10ML IV SOLN
INTRAVENOUS | Status: DC | PRN
  Administered 2016-08-16: 10 mg via INTRAVENOUS
  Administered 2016-08-16: 40 mg via INTRAVENOUS

## 2016-08-16 MED ORDER — PROPOFOL 10 MG/ML IV BOLUS
INTRAVENOUS | Status: AC
  Filled 2016-08-16: qty 20

## 2016-08-16 MED ORDER — FLUTICASONE PROPIONATE 50 MCG/ACT NA SUSP
2.0000 | Freq: Every day | NASAL | Status: DC
  Administered 2016-08-17: 2 via NASAL
  Filled 2016-08-16: qty 16

## 2016-08-16 MED ORDER — SUGAMMADEX SODIUM 500 MG/5ML IV SOLN
INTRAVENOUS | Status: DC | PRN
  Administered 2016-08-16: 317.6 mg via INTRAVENOUS

## 2016-08-16 MED ORDER — ONDANSETRON HCL 4 MG/2ML IJ SOLN: 4.0000 mg | Freq: Four times a day (QID) | INTRAMUSCULAR | Status: DC | PRN

## 2016-08-16 MED ORDER — LIDOCAINE HCL (CARDIAC) 20 MG/ML IV SOLN
INTRAVENOUS | Status: DC | PRN
  Administered 2016-08-16: 100 mg via INTRAVENOUS

## 2016-08-16 MED ORDER — LACTATED RINGERS IV SOLN
INTRAVENOUS | Status: DC | PRN
  Administered 2016-08-16: 21:00:00 via INTRAVENOUS

## 2016-08-16 MED ORDER — OXYCODONE-ACETAMINOPHEN 5-325 MG PO TABS
1.0000 | ORAL_TABLET | ORAL | Status: DC | PRN
  Administered 2016-08-16 – 2016-08-17 (×4): 1 via ORAL
  Filled 2016-08-16 (×4): qty 1

## 2016-08-16 MED ORDER — PANTOPRAZOLE SODIUM 40 MG PO TBEC
40.0000 mg | DELAYED_RELEASE_TABLET | Freq: Every day | ORAL | Status: DC
  Administered 2016-08-17: 40 mg via ORAL
  Filled 2016-08-16: qty 1

## 2016-08-16 MED ORDER — ONDANSETRON 4 MG PO TBDP: 4.0000 mg | ORAL_TABLET | Freq: Four times a day (QID) | ORAL | Status: DC | PRN

## 2016-08-16 MED ORDER — ONDANSETRON HCL 4 MG/2ML IJ SOLN: 4.0000 mg | Freq: Once | INTRAMUSCULAR | Status: DC | PRN

## 2016-08-16 MED ORDER — FENTANYL CITRATE (PF) 100 MCG/2ML IJ SOLN
INTRAMUSCULAR | Status: AC
  Filled 2016-08-16: qty 2

## 2016-08-16 MED ORDER — PANTOPRAZOLE SODIUM 40 MG IV SOLR
40.0000 mg | Freq: Every day | INTRAVENOUS | Status: DC
  Administered 2016-08-16: 40 mg via INTRAVENOUS
  Filled 2016-08-16: qty 40

## 2016-08-16 MED ORDER — MORPHINE SULFATE (PF) 4 MG/ML IV SOLN: 4.0000 mg | INTRAVENOUS | Status: DC | PRN

## 2016-08-16 MED ORDER — HEPARIN SODIUM (PORCINE) 5000 UNIT/ML IJ SOLN
5000.0000 [IU] | Freq: Three times a day (TID) | INTRAMUSCULAR | Status: DC
  Administered 2016-08-16 – 2016-08-17 (×3): 5000 [IU] via SUBCUTANEOUS
  Filled 2016-08-16: qty 1
  Filled 2016-08-16: qty 2
  Filled 2016-08-16: qty 1

## 2016-08-16 MED ORDER — SODIUM CHLORIDE 0.9 % IV BOLUS (SEPSIS)
1000.0000 mL | Freq: Once | INTRAVENOUS | Status: AC
  Administered 2016-08-16: 1000 mL via INTRAVENOUS

## 2016-08-16 MED ORDER — PHENYLEPHRINE HCL 10 MG/ML IJ SOLN
INTRAMUSCULAR | Status: DC | PRN
  Administered 2016-08-16: 100 ug via INTRAVENOUS

## 2016-08-16 MED ORDER — PROPOFOL 10 MG/ML IV BOLUS
INTRAVENOUS | Status: DC | PRN
  Administered 2016-08-16: 250 mg via INTRAVENOUS

## 2016-08-16 MED ORDER — LACTATED RINGERS IV SOLN
INTRAVENOUS | Status: DC
  Administered 2016-08-16 – 2016-08-17 (×3): via INTRAVENOUS

## 2016-08-16 MED ORDER — SUCCINYLCHOLINE CHLORIDE 20 MG/ML IJ SOLN
INTRAMUSCULAR | Status: DC | PRN
  Administered 2016-08-16: 120 mg via INTRAVENOUS

## 2016-08-16 MED ORDER — CARVEDILOL 12.5 MG PO TABS
12.5000 mg | ORAL_TABLET | Freq: Two times a day (BID) | ORAL | Status: DC
  Administered 2016-08-16 – 2016-08-17 (×2): 12.5 mg via ORAL
  Filled 2016-08-16 (×2): qty 1

## 2016-08-16 MED ORDER — ACETAMINOPHEN 10 MG/ML IV SOLN
INTRAVENOUS | Status: DC | PRN
  Administered 2016-08-16: 1000 mg via INTRAVENOUS

## 2016-08-16 MED ORDER — IOPAMIDOL (ISOVUE-300) INJECTION 61%
125.0000 mL | Freq: Once | INTRAVENOUS | Status: AC | PRN
  Administered 2016-08-16: 125 mL via INTRAVENOUS
  Filled 2016-08-16: qty 150

## 2016-08-16 MED ORDER — DEXAMETHASONE SODIUM PHOSPHATE 10 MG/ML IJ SOLN
INTRAMUSCULAR | Status: DC | PRN
  Administered 2016-08-16: 10 mg via INTRAVENOUS

## 2016-08-16 MED ORDER — FENTANYL CITRATE (PF) 100 MCG/2ML IJ SOLN
INTRAMUSCULAR | Status: DC | PRN
Start: 2016-08-16 — End: 2016-08-16
  Administered 2016-08-16 (×2): 100 ug via INTRAVENOUS

## 2016-08-16 SURGICAL SUPPLY — 41 items
ADHESIVE MASTISOL STRL (MISCELLANEOUS) ×3 IMPLANT
APPLIER CLIP ROT 10 11.4 M/L (STAPLE) ×3
BLADE SURG SZ11 CARB STEEL (BLADE) ×3 IMPLANT
CANISTER SUCT 3000ML PPV (MISCELLANEOUS) ×3 IMPLANT
CATH TRAY 16F METER LATEX (MISCELLANEOUS) ×3 IMPLANT
CHLORAPREP W/TINT 26ML (MISCELLANEOUS) ×3 IMPLANT
CLIP APPLIE ROT 10 11.4 M/L (STAPLE) ×1 IMPLANT
CLOSURE WOUND 1/2 X4 (GAUZE/BANDAGES/DRESSINGS) ×1
CUTTER FLEX LINEAR 45M (STAPLE) ×3 IMPLANT
DEVICE TROCAR PUNCTURE CLOSURE (ENDOMECHANICALS) ×3 IMPLANT
ELECT REM PT RETURN 9FT ADLT (ELECTROSURGICAL) ×3
ELECTRODE REM PT RTRN 9FT ADLT (ELECTROSURGICAL) ×1 IMPLANT
ENDOPOUCH RETRIEVER 10 (MISCELLANEOUS) ×3 IMPLANT
GAUZE SPONGE NON-WVN 2X2 STRL (MISCELLANEOUS) ×3 IMPLANT
GLOVE BIO SURGEON STRL SZ8 (GLOVE) ×3 IMPLANT
GOWN STRL REUS W/ TWL LRG LVL3 (GOWN DISPOSABLE) ×2 IMPLANT
GOWN STRL REUS W/TWL LRG LVL3 (GOWN DISPOSABLE) ×4
IRRIGATION STRYKERFLOW (MISCELLANEOUS) ×1 IMPLANT
IRRIGATOR STRYKERFLOW (MISCELLANEOUS) ×3
KIT RM TURNOVER STRD PROC AR (KITS) ×3 IMPLANT
LABEL OR SOLS (LABEL) ×3 IMPLANT
NDL SAFETY 22GX1.5 (NEEDLE) ×3 IMPLANT
NEEDLE VERESS 14GA 120MM (NEEDLE) ×3 IMPLANT
NS IRRIG 500ML POUR BTL (IV SOLUTION) ×3 IMPLANT
PACK LAP CHOLECYSTECTOMY (MISCELLANEOUS) ×3 IMPLANT
RELOAD 45 VASCULAR/THIN (ENDOMECHANICALS) ×3 IMPLANT
RELOAD STAPLE TA45 3.5 REG BLU (ENDOMECHANICALS) ×3 IMPLANT
SCISSORS METZENBAUM CVD 33 (INSTRUMENTS) IMPLANT
SLEEVE ENDOPATH XCEL 5M (ENDOMECHANICALS) ×3 IMPLANT
SOL .9 NS 3000ML IRR  AL (IV SOLUTION) ×2
SOL .9 NS 3000ML IRR UROMATIC (IV SOLUTION) ×1 IMPLANT
SPONGE LAP 18X18 5 PK (GAUZE/BANDAGES/DRESSINGS) ×3 IMPLANT
SPONGE VERSALON 2X2 STRL (MISCELLANEOUS) ×6
STRIP CLOSURE SKIN 1/2X4 (GAUZE/BANDAGES/DRESSINGS) ×2 IMPLANT
SUT MNCRL 4-0 (SUTURE) ×2
SUT MNCRL 4-0 27XMFL (SUTURE) ×1
SUT VICRYL 0 TIES 12 18 (SUTURE) ×3 IMPLANT
SUTURE MNCRL 4-0 27XMF (SUTURE) ×1 IMPLANT
TROCAR XCEL 12X100 BLDLESS (ENDOMECHANICALS) ×3 IMPLANT
TROCAR XCEL NON-BLD 5MMX100MML (ENDOMECHANICALS) ×3 IMPLANT
TUBING INSUFFLATOR HI FLOW (MISCELLANEOUS) ×3 IMPLANT

## 2016-08-16 NOTE — ED Triage Notes (Signed)
Pt to ED from home c/o right mid abd pain starting today 1.5hr PTA.  Denies n/v/d.  Seen at [facility] concerning for appendicitis.  Pain was sudden and stabbing, worse with movement.

## 2016-08-16 NOTE — H&P (Signed)
Patient ID: [name] [name], female   DOB: [birth year], 46 y.o.   MRN: [id number]  HPI Nanetta Wiegman is a 46 y.o. female with acute onset of right lower quadrant pain that is started this morning. She reports that the pain is sharp and intermittent and mild to moderate in intensity. And she reports that her pain is worsening when she moves and when she bends over. No fevers no chills. Decreased appetite. No emesis. She does have a history of left bundle bladder branch block after she had an ablation for atrial tachyarrhythmia. Last echocardiogram was normal about 3 years ago and she sees cardiology and a regular basis. She is not on any anticoagulation at this time. She is morbidly obese but very functional and she is able to perform more than 4 Mets without shortness of breath or chest pain. She does have sleep apnea. CT scan personal review showing evidence of dilated appendix with appendiceal fat inflammatory response consistent with appendicitis. There is no abscess there is no free air. There is no evidence of perforation. Her white count is elevated and her creatinine is normal. EKG  personally reviewed and shows an unchanged left bundle branch block with no evidence of acute ischemic changes  HPI  Past Medical History:  Diagnosis Date  . Atrial tachycardia, paroxysmal [facility])    s/p ablation in 2009 by Dr. Chales Abrahams in [facility] (falied)  . Hypertension   . LBBB (left bundle branch block)   . Obesity   . Sleep apnea     Past Surgical History:  Procedure Laterality Date  . ABLATION OF DYSRHYTHMIC FOCUS  2009  . ANTERIOR CRUCIATE LIGAMENT REPAIR    . TONSILLECTOMY      Family History  Problem Relation Age of Onset  . Hyperlipidemia Mother   . Hypertension Mother   . Hypertension Father     Social History Social History  Substance Use Topics  . Smoking status: Never Smoker  . Smokeless tobacco: Never Used  . Alcohol use No    Allergies  Allergen Reactions  . Aspirin    swelling    Current Facility-Administered Medications  Medication Dose Route Frequency Provider Last Rate Last Dose  . piperacillin-tazobactam (ZOSYN) IVPB 3.375 g  3.375 g Intravenous Q8H Diego F Pabon, MD 12.5 mL/hr at 08/16/16 1704 3.375 g at 08/16/16 1704  . sodium chloride 0.9 % bolus 1,000 mL  1,000 mL Intravenous Once Leafy Ro, MD 1,000 mL/hr at 08/16/16 1703 1,000 mL at 08/16/16 1703   Current Outpatient Prescriptions  Medication Sig Dispense Refill  . carvedilol (COREG) 12.5 MG tablet take 1 tablet by mouth twice a day 60 tablet 3  . fluticasone (FLONASE) 50 MCG/ACT nasal spray Place 2 sprays into both nostrils daily. 16 g 6  . gabapentin (NEURONTIN) 300 MG capsule Reported on 08/30/2015  0  . meloxicam (MOBIC) 15 MG tablet Take 1 tablet (15 mg total) by mouth daily. 30 tablet 0  . omeprazole (PRILOSEC) 20 MG capsule take 1 capsule by mouth once daily 30 capsule 12     Review of Systems Full ROS  was asked and was negative except for the information on the HPI  Physical Exam Blood pressure (!) 152/94, pulse 82, temperature 98.6 F (37 C), temperature source Oral, resp. rate 20, height  (1.6 m), weight (!) 158.8 kg (350 lb), last menstrual period 08/02/2016, SpO2 96 %. CONSTITUTIONAL: Morbidly obese female in NAD EYES: Pupils are equal, round, and reactive to light, Sclera are  non-icteric. EARS, NOSE, MOUTH AND THROAT: The oropharynx is clear. The oral mucosa is pink and moist. Hearing is intact to voice. LYMPH NODES:  Lymph nodes in the neck are normal. RESPIRATORY:  Lungs are clear. There is normal respiratory effort, with equal breath sounds bilaterally, and without pathologic use of accessory muscles. CARDIOVASCULAR: Heart is regular without murmurs, gallops, or rubs. GI: The abdomen is soft,Large panus. TTP Mcburney's point. No rebound. GU: Rectal deferred.   MUSCULOSKELETAL: Normal muscle strength and tone. No cyanosis or edema.   SKIN: Turgor is good and there  are no pathologic skin lesions or ulcers. NEUROLOGIC: Motor and sensation is grossly normal. Cranial nerves are grossly intact. PSYCH:  Oriented to person, place and time. Affect is normal.  Data Reviewed  I have personally reviewed the patient's imaging, laboratory findings and medical records.    Assessment/Plan 46 morbidly obese female with signs and sxs of appendicitis confirmed by CT scan. Discussed with the patient in detail about her disease process and I do recommend laparoscopic appendectomy. The risks, benefits, complications, treatment options, and expected outcomes were discussed with the patient. The treatment of antibiotics alone was discussed giving a 20% chance that this could fail and surgery would be necessary.  Also discussed continuing to the operating room for Laparoscopic Appendectomy.  The possibilities of  bleeding, recurrent infection, perforation of viscus, finding a normal appendix, the need for additional procedures, failure to diagnose a condition, conversion to open procedure and creating a complication requiring transfusion or further operations were discussed. The patient was given the opportunity to ask questions and have them answered.  Patient would like to proceed with Laparoscopic Appendectomy and consent was obtained. I have talked to the OR and de-aired is available time will be after 7 PM therefore my partner Dr. Excell Seltzer will be performing this. I have also discussed with the anesthesiology team about his morbid obesity and increased risk for airway complications. And they are willing to put her to sleep and an patient is also aware about potential airway complications   Sterling Big, MD FACS General Surgeon 08/16/2016, 5:19 PM

## 2016-08-16 NOTE — Anesthesia Post-op Follow-up Note (Cosign Needed)
Anesthesia QCDR form completed.        

## 2016-08-16 NOTE — ED Provider Notes (Signed)
ED ECG REPORT I, Willy Eddy, the attending physician, personally viewed and interpreted this ECG.   Date: 08/16/2016  EKG Time: opre-op  Rate: 70  Rhythm: sinus  Axis: normal  Intervals:normal intervals,  ST&T Change: LBBB unchanged, no sgarbossa criteria    Willy Eddy, MD 08/16/16 [phone]

## 2016-08-16 NOTE — Anesthesia Procedure Notes (Signed)
Procedure Name: Intubation Performed by: Hasheem Voland Pre-anesthesia Checklist: Patient identified, Patient being monitored, Timeout performed, Emergency Drugs available and Suction available Patient Re-evaluated:Patient Re-evaluated prior to inductionOxygen Delivery Method: Circle system utilized Preoxygenation: Pre-oxygenation with 100% oxygen Intubation Type: IV induction Ventilation: Mask ventilation without difficulty Laryngoscope Size: Mac and 3 Grade View: Grade I Tube type: Oral Tube size: 7.0 mm Number of attempts: 1 Airway Equipment and Method: Stylet Placement Confirmation: ETT inserted through vocal cords under direct vision,  positive ETCO2 and breath sounds checked- equal and bilateral Secured at: 21 cm Tube secured with: Tape Dental Injury: Teeth and Oropharynx as per pre-operative assessment        

## 2016-08-16 NOTE — Addendum Note (Signed)
Addended by: Catha Brow T on: 08/16/2016 12:58 PM   Modules accepted: Orders

## 2016-08-16 NOTE — Transfer of Care (Signed)
Immediate Anesthesia Transfer of Care Note  Patient: [name] [name]  Procedure(s) Performed: Procedure(s): APPENDECTOMY LAPAROSCOPIC (N/A)  Patient Location: PACU  Anesthesia Type:General  Level of Consciousness: awake, alert  and oriented  Airway & Oxygen Therapy: Patient Spontanous Breathing and Patient connected to face mask oxygen  Post-op Assessment: Report given to RN and Post -op Vital signs reviewed and stable  Post vital signs: Reviewed and stable  Last Vitals:  Vitals:   08/16/16 1827 08/16/16 1851  BP: (!) 155/76 138/60  Pulse: 70 72  Resp: 18 18  Temp: 37.2 C 36.7 C    Last Pain:  Vitals:   08/16/16 1851  TempSrc: Oral  PainSc: 1          Complications: No apparent anesthesia complications

## 2016-08-16 NOTE — ED Notes (Signed)
See triage note  States she was getting ready to go shopping with family and developed some pain to right lower abd  ..describes pain as sharp  Non radiating but increases with movement  No fever or n/v/d or urinary sx's

## 2016-08-16 NOTE — Anesthesia Preprocedure Evaluation (Addendum)
Anesthesia Evaluation  Patient identified by MRN, date of birth, ID band Patient awake    Reviewed: Allergy & Precautions, NPO status , Patient's Chart, lab work & pertinent test results  History of Anesthesia Complications (+) PONV  Airway Mallampati: I  TM Distance: >3 FB     Dental   Pulmonary sleep apnea and Continuous Positive Airway Pressure Ventilation ,           Cardiovascular hypertension, Pt. on medications and Pt. on home beta blockers + dysrhythmias (atrial tachycardia)      Neuro/Psych negative neurological ROS     GI/Hepatic Neg liver ROS, GERD  Medicated and Controlled,  Endo/Other  Morbid obesity  Renal/GU negative Renal ROS     Musculoskeletal negative musculoskeletal ROS (+)   Abdominal   Peds  Hematology negative hematology ROS (+)   Anesthesia Other Findings   Reproductive/Obstetrics                            Anesthesia Physical Anesthesia Plan  ASA: III and emergent  Anesthesia Plan: General   Post-op Pain Management:    Induction: Intravenous  Airway Management Planned: Oral ETT  Additional Equipment:   Intra-op Plan:   Post-operative Plan:   Informed Consent: I have reviewed the patients History and Physical, chart, labs and discussed the procedure including the risks, benefits and alternatives for the proposed anesthesia with the patient or authorized representative who has indicated his/her understanding and acceptance.     Plan Discussed with:   Anesthesia Plan Comments:         Anesthesia Quick Evaluation

## 2016-08-16 NOTE — Progress Notes (Signed)
Preoperative Review   Patient is met in the preoperative holding area. The history is reviewed in the chart and with the patient. I personally reviewed the options and rationale as well as the risks of this procedure that have been previously discussed with the patient. All questions asked by the patient and/or family were answered to their satisfaction.  Patient agrees to proceed with this procedure at this time.  Keziah Avis E Bijon Mineer M.D. FACS  

## 2016-08-16 NOTE — Progress Notes (Signed)
S: c/o rlq pain, started this am, no fever chills, normal bm last 2 days, is midcycle with menses, pain was increased by going over railroad tracks, does state that over the last few months she has had intermittent bms that were yellow in color, states she still has her gallbladder and appendix  O: vitals wnl, nad, abd is obese, tender in ruq, rebounds to rlq, rlq tender, llq a little tender but rebounds to rlq, bs normal, n/v intact  A: rlq pain  P: sent pt to ER for eval

## 2016-08-16 NOTE — Op Note (Signed)
laparascopic appendectomy   Buna Cuppett Date of operation:  08/16/2016  Indications: The patient presented with a history of  abdominal pain. Workup has revealed findings consistent with acute appendicitis.  Pre-operative Diagnosis: Acute appendicitis  Post-operative Diagnosis: Acute suppurative appendicitis, nonruptured  Surgeon: Adah Salvage. Excell Seltzer, MD, FACS  Anesthesia: General with endotracheal tube  Procedure Details  The patient was seen again in the preop area. The options of surgery versus observation were reviewed with the patient and/or family. The risks of bleeding, infection, recurrence of symptoms, negative laparoscopy, potential for an open procedure, bowel injury, abscess or infection, were all reviewed as well. The patient was taken to Operating Room, identified as Lakin Rhine and the procedure verified as laparoscopic appendectomy. A Time Out was held and the above information confirmed.  The patient was placed in the supine position and general anesthesia was induced.  Antibiotic prophylaxis was administered and VT E prophylaxis was in place. A Foley catheter was placed by the nursing staff.   The abdomen was prepped and draped in a sterile fashion. An infraumbilical incision was made. An extra long Veress needle was placed and pneumoperitoneum was obtained. A 5 mm Visiport trocar port was placed without difficulty utilizing direct camera vision and the abdominal cavity was explored. A small uterine fibroid was noted measuring sub-centimeter in size. Photos were taken. Under direct vision a 5 mm suprapubic port was placed and a 13 mm left lateral port was placed all under direct vision.  The appendix was identified and found to be acutely inflamed suppurative but nonruptured . The appendix was carefully dissected. The base of the appendix was dissected out and divided with a standard load Endo GIA. The mesoappendix was divided with a vascular load Endo GIA. Clips were utilized  to control minor hemorrhage along the staple line. The appendix was passed out through the left lateral port site with the aid of an Endo Catch bag. The right lower quadrant and pelvis was then irrigated with copious amounts of normal saline which was aspirated. Inspection  failed to identify any additional bleeding and there were no signs of bowel injury. Therefore the left lateral port site was closed under direct vision utilizing an Endo Close technique with 0 Vicryl interrupted sutures, all under direct vision.   Again the right lower quadrant was inspected there was no sign of bleeding or bowel injury therefore pneumoperitoneum was released, all ports were removed and the skin incisions were approximated with subcuticular 4-0 Monocryl. Steri-Strips and Mastisol and sterile dressings were placed.  The patient tolerated the procedure well, there were no complications. The sponge lap and needle count were correct at the end of the procedure.  The patient was taken to the recovery room in stable condition to be admitted for continued care.  Findings: Acute appendicitis suppurative nonruptured; small uterine fibroid  Estimated Blood Loss: Minimal                  Specimens: appendix         Complications:  None                  Nakhia Levitan E. Excell Seltzer MD, FACS

## 2016-08-16 NOTE — ED Provider Notes (Signed)
[facility] Emergency Department Provider Note   ____________________________________________   First MD Initiated Contact with Patient 08/16/16 1415     (approximate)  I have reviewed the triage vital signs and the nursing notes.   HISTORY  Chief Complaint Abdominal Pain    HPI [name] [name] is a 46 y.o. female is brought to the emergency room by her husband with complaint of right lower quadrant pain. Patient states that this is been intermittent for several weeks however this morning it became more severe and sharp. She was seen at the employee health clinic and sent to the emergency room for further evaluation. She states that when she "crossed the railroad track it caused pain". She denies any nausea, vomiting, fever or chills. She last ate this morning when she had one biscuit. Her last bowel movement was last evening and was considered to be normal. Currently she denies pain unless she is moving which increases her pain to a "sharp sensation". She denies any vaginal discharge, urinary symptoms such as frequency or urgency. Currently lying still she rates her pain as a 1 out of 10.   Past Medical History:  Diagnosis Date  . Atrial tachycardia, paroxysmal [facility])    s/p ablation in 2009 by Dr. Chales Abrahams in [facility] (falied)  . Hypertension   . LBBB (left bundle branch block)   . Obesity   . Sleep apnea     Patient Active Problem List   Diagnosis Date Noted  . Pain in joint, lower leg 01/12/2014  . Obesity   . Hypertension   . Atrial tachycardia, paroxysmal (HCC)   . LBBB (left bundle branch block)     Past Surgical History:  Procedure Laterality Date  . ABLATION OF DYSRHYTHMIC FOCUS  2009  . ANTERIOR CRUCIATE LIGAMENT REPAIR    . TONSILLECTOMY      Prior to Admission medications   Medication Sig Start Date End Date Taking? Authorizing Provider  carvedilol (COREG) 12.5 MG tablet take 1 tablet by mouth twice a day 04/20/16   Iran Ouch, MD  fluticasone (FLONASE) 50 MCG/ACT nasal spray Place 2 sprays into both nostrils daily. 05/28/16   Faythe Ghee, PA-C  gabapentin (NEURONTIN) 300 MG capsule Reported on 08/30/2015 08/02/15   Historical Provider, MD  meloxicam (MOBIC) 15 MG tablet Take 1 tablet (15 mg total) by mouth daily. 02/07/15   Joni Reining, PA-C  omeprazole (PRILOSEC) 20 MG capsule take 1 capsule by mouth once daily 03/23/16   Faythe Ghee, PA-C    Allergies Aspirin  Family History  Problem Relation Age of Onset  . Hyperlipidemia Mother   . Hypertension Mother   . Hypertension Father     Social History Social History  Substance Use Topics  . Smoking status: Never Smoker  . Smokeless tobacco: Never Used  . Alcohol use No    Review of Systems Constitutional: No fever/chills Eyes: No visual changes. ENT: No sore throat. Cardiovascular: Denies chest pain. Respiratory: Denies shortness of breath. Gastrointestinal: Positive right lower quadrant pain.  No nausea, no vomiting.  No diarrhea.  No constipation. Genitourinary: Negative for dysuria. Musculoskeletal: Negative for back pain. Skin: Negative for rash. Neurological: Negative for headaches Allergic/Immunilogical: Allergic to aspirin.  ____________________________________________   PHYSICAL EXAM:  VITAL SIGNS: ED Triage Vitals  Enc Vitals Group     BP 08/16/16 1250 (!) 152/94     Pulse Rate 08/16/16 1250 82     Resp 08/16/16 1250 20  Temp 08/16/16 1250 98.6 F (37 C)     Temp Source 08/16/16 1250 Oral     SpO2 08/16/16 1250 96 %     Weight 08/16/16 1249 (!) 350 lb (158.8 kg)     Height 08/16/16 1249  (1.6 m)     Head Circumference --      Peak Flow --      Pain Score 08/16/16 1255 1     Pain Loc --      Pain Edu? --      Excl. in GC? --     Constitutional: Alert and oriented. Well appearing and in no acute distress. Eyes: Conjunctivae are normal. PERRL. EOMI. Head: Atraumatic. Nose: No  congestion/rhinnorhea. Neck: No stridor.   Cardiovascular: Normal rate, regular rhythm. Grossly normal heart sounds.  Good peripheral circulation. Respiratory: Normal respiratory effort.  No retractions. Lungs CTAB. Gastrointestinal: Soft and nontender. Obese. Diffuse right lower quadrant tenderness with no appreciated rebound tenderness. No definite McBurney's point tenderness is appreciated. There is referred pain to the right lower quadrant with palpation. Bowel sounds are hypoactive but present in all 4 quadrants. Musculoskeletal: Moves upper and lower extremities without any difficulty. Normal gait was noted. Neurologic:  Normal speech and language. No gross focal neurologic deficits are appreciated. No gait instability. Skin:  Skin is warm, dry and intact. No rash noted. Psychiatric: Mood and affect are normal. Speech and behavior are normal.  ____________________________________________   LABS (all labs ordered are listed, but only abnormal results are displayed)  Labs Reviewed  COMPREHENSIVE METABOLIC PANEL - Abnormal; Notable for the following:       Result Value   Glucose, Bld 110 (*)    All other components within normal limits  CBC - Abnormal; Notable for the following:    WBC 13.9 (*)    RDW 15.0 (*)    All other components within normal limits  URINALYSIS, COMPLETE (UACMP) WITH MICROSCOPIC - Abnormal; Notable for the following:    Color, Urine YELLOW (*)    APPearance CLEAR (*)    Squamous Epithelial / LPF 0-5 (*)    All other components within normal limits  LIPASE, BLOOD  PREGNANCY, URINE   ____________________________________________  EKG  Read by Dr. Roxan Hockey ____________________________________________  RADIOLOGY CT abdomen and pelvis with contrast per radiologist: IMPRESSION:  Appendicitis - no evidence of complicating features.       ____________________________________________   PROCEDURES  Procedure(s) performed:  None  Procedures  Critical Care performed: No  ____________________________________________   INITIAL IMPRESSION / ASSESSMENT AND PLAN / ED COURSE  Pertinent labs & imaging results that were available during my care of the patient were reviewed by me and considered in my medical decision making (see chart for details).  ----------------------------------------- 4:36 PM on 08/16/2016 ----------------------------------------- Discussed with Dr. Everlene Farrier who is the surgeon on call.  He will see the patient in the ED. At this time plans are being made to admit the patient.     ____________________________________________   FINAL CLINICAL IMPRESSION(S) / ED DIAGNOSES  Final diagnoses:  Acute appendicitis with localized peritonitis      NEW MEDICATIONS STARTED DURING THIS VISIT:  Current Discharge Medication List       Note:  This document was prepared using Dragon voice recognition software and may include unintentional dictation errors.    Tommi Rumps, PA-C 08/16/16 1902    Myrna Blazer, MD 08/19/16 [phone]

## 2016-08-17 ENCOUNTER — Encounter: Payer: Self-pay | Admitting: Surgery

## 2016-08-17 LAB — HIV ANTIBODY (ROUTINE TESTING W REFLEX): HIV Screen 4th Generation wRfx: NONREACTIVE

## 2016-08-17 MED ORDER — AMOXICILLIN-POT CLAVULANATE 875-125 MG PO TABS: 1.0000 | ORAL_TABLET | Freq: Two times a day (BID) | ORAL | 0 refills | Status: AC

## 2016-08-17 MED ORDER — OXYCODONE HCL 5 MG PO TABS: 5.0000 mg | ORAL_TABLET | Freq: Four times a day (QID) | ORAL | 0 refills | Status: DC | PRN

## 2016-08-17 NOTE — Progress Notes (Signed)
Pt has home unit CPAP with nasal mask. O2 SAT 96% Hr 88.No distress noted.

## 2016-08-17 NOTE — Progress Notes (Signed)
Patient discharge teaching given, including activity, diet, follow-up appoints, and medications. Patient verbalized understanding of all discharge instructions. IV access was d/c'd. Vitals are stable. Skin is intact except as charted in most recent assessments. Pt to be escorted out by NT, to be driven home by family.  Perina Salvaggio  

## 2016-08-17 NOTE — Anesthesia Postprocedure Evaluation (Signed)
Anesthesia Post Note  Patient: [name] [name]  Procedure(s) Performed: Procedure(s) (LRB): APPENDECTOMY LAPAROSCOPIC (N/A)  Patient location during evaluation: PACU Anesthesia Type: General Level of consciousness: awake and alert Pain management: pain level controlled Vital Signs Assessment: post-procedure vital signs reviewed and stable Respiratory status: spontaneous breathing and respiratory function stable Cardiovascular status: stable Anesthetic complications: no     Last Vitals:  Vitals:   08/16/16 2235 08/17/16 0016  BP: 125/62 (!) 121/55  Pulse: 63 64  Resp: 18 18  Temp: 36.9 C 36.9 C    Last Pain:  Vitals:   08/17/16 0016  TempSrc: Oral  PainSc:                  Godson Pollan K

## 2016-08-17 NOTE — Discharge Summary (Addendum)
Patient ID: [name] [name] MRN: [id number] DOB/AGE: 48-13-1972 48 y.o.  Admit date: 08/16/2016 Discharge date: 08/17/2016   Discharge Diagnoses:  Active Problems:   Appendicitis, acute   Procedures:  Laparoscopic appendectomy  Hospital Course:  Patient was admitted on 4/26 with acute appendicitis and underwent an uneventful laparoscopic appendectomy.  Her diet was slowly advanced.  Her pain medications were converted to oral.  She was voiding, ambulating, tolerating a regular diet, and her pain was well controlled.  She was deemed ready for discharge.  On exam, she was in no acute distress, with stable normal vital signs.  Her abdomen was soft, nondistended, appropriately tender to palpation.  Incisions were clean with dressing in place without evidence of infection.  Consults: None  Disposition:  Home, self-care  Discharge Instructions    Call MD for:  difficulty breathing, headache or visual disturbances    Complete by:  As directed    Call MD for:  persistant nausea and vomiting    Complete by:  As directed    Call MD for:  redness, tenderness, or signs of infection (pain, swelling, redness, odor or green/yellow discharge around incision site)    Complete by:  As directed    Call MD for:  severe uncontrolled pain    Complete by:  As directed    Call MD for:  temperature >100.4    Complete by:  As directed    Diet - low sodium heart healthy    Complete by:  As directed    Discharge instructions    Complete by:  As directed    Patient may shower but do not scrub wounds heavily and dab dry only.  Do not submerge the wounds in pool/tub.   Driving Restrictions    Complete by:  As directed    Do not drive while taking narcotics for pain control.   Increase activity slowly    Complete by:  As directed    Lifting restrictions    Complete by:  As directed    No heavy lifting or pushing of more than 10-15 lbs for 4 weeks.   Remove dressing in 24 hours    Complete by:  As  directed      Allergies as of 08/17/2016      Reactions   Aspirin    swelling      Medication List    STOP taking these medications   amoxicillin 875 MG tablet Commonly known as:  AMOXIL     TAKE these medications   amoxicillin-clavulanate 875-125 MG tablet Commonly known as:  AUGMENTIN Take 1 tablet by mouth 2 (two) times daily.   carvedilol 12.5 MG tablet Commonly known as:  COREG take 1 tablet by mouth twice a day   fluticasone 50 MCG/ACT nasal spray Commonly known as:  FLONASE Place 2 sprays into both nostrils daily.   meloxicam 15 MG tablet Commonly known as:  MOBIC Take 1 tablet (15 mg total) by mouth daily.   omeprazole 20 MG capsule Commonly known as:  PRILOSEC take 1 capsule by mouth once daily   oxyCODONE 5 MG immediate release tablet Commonly known as:  Oxy IR/ROXICODONE Take 1-2 tablets (5-10 mg total) by mouth every 6 (six) hours as needed for severe pain.      Follow-up Information    Dionne Milo, MD Follow up in 2 week(s).   Specialty:  Surgery Contact information: [address] [place] [address] [phone]

## 2016-08-18 ENCOUNTER — Other Ambulatory Visit: Payer: Self-pay | Admitting: Cardiovascular Disease

## 2016-08-20 LAB — SURGICAL PATHOLOGY

## 2016-08-27 ENCOUNTER — Ambulatory Visit (INDEPENDENT_AMBULATORY_CARE_PROVIDER_SITE_OTHER): Payer: Managed Care, Other (non HMO) | Admitting: Cardiovascular Disease

## 2016-08-27 ENCOUNTER — Encounter: Payer: Self-pay | Admitting: Cardiovascular Disease

## 2016-08-27 VITALS — BP 126/88 | HR 79 | Ht 63.0 in | Wt 373.5 lb

## 2016-08-27 DIAGNOSIS — I10 Essential (primary) hypertension: Secondary | ICD-10-CM

## 2016-08-27 DIAGNOSIS — I447 Left bundle-branch block, unspecified: Secondary | ICD-10-CM

## 2016-08-27 DIAGNOSIS — I471 Supraventricular tachycardia: Secondary | ICD-10-CM | POA: Diagnosis not present

## 2016-08-27 NOTE — Progress Notes (Signed)
Cardiology Office Note   Date:  08/27/2016   ID:  [name] FlakeKristi [name], DOB 03/26/1971, MRN [id number]  PCP:  Olive Bassough, Robert L, MD  Cardiologist:   Lorine BearsMuhammad Trenee Igoe, MD   Chief Complaint  Patient presents with  . other    44mo f/u. Pt states she is doing well- had recent appendectomy.  Reviewed meds with pt verbally.      History of Present Illness: [name] PhoenixKristi [name] is a 46 y.o. female who presents for a followup visit regarding paroxysmal atrial tachycardia, hypertension and left bundle branch block. In 2009 she was diagnosed with atrial tachycardia and underwent unsuccessful ablation by Dr. Chales Abrahamsyson in [place]. She developed left bundle branch block after that. She has been treated with carvedilol for both palpitations and essential hypertension. She has known history of sleep apnea on CPAP.  She had recent appendectomy for appendicitis without complications. She has been doing reasonably well from a cardiac standpoint with no chest pain or worsening dyspnea. She continues to struggle with morbid obesity. She reports rare episodes of brief palpitations.   Past Medical History:  Diagnosis Date  . Atrial tachycardia, paroxysmal [name] [name](HCC)    s/p ablation in 2009 by Dr. [facility]ales [facility]yson [facility] (fal[place])  . Hypertension   . LBBB (left bundle branch block)   . Obesity   . Sleep apnea     Past Surgical History:  Procedure Laterality Date  . ABLATION OF DYSRHYTHMIC FOCUS  2009  . ANTERIOR CRUCIATE LIGAMENT REPAIR    . LAPAROSCOPIC APPENDECTOMY N/A 08/16/2016   Procedure: APPENDECTOMY LAPAROSCOPIC;  Surgeon: Lattie Hawichard E Cooper, MD;  Location: [facility] ORS;  Service: General;  Laterality: N/A;  . TONSILLECTOMY       Current Outpatient Prescriptions  Medication Sig Dispense Refill  . carvedilol (COREG) 12.5 MG tablet take 1 tablet by mouth twice a day 60 tablet 1  . fluticasone (FLONASE) 50 MCG/ACT nasal spray Place 2 sprays into both nostrils daily. 16 g 6  . meloxicam (MOBIC) 15 MG tablet Take 1 tablet  (15 mg total) by mouth daily. 30 tablet 0  . omeprazole (PRILOSEC) 20 MG capsule take 1 capsule by mouth once daily 30 capsule 12  . oxyCODONE (OXY IR/ROXICODONE) 5 MG immediate release tablet Take 1-2 tablets (5-10 mg total) by mouth every 6 (six) hours as needed for severe pain. 30 tablet 0   No current facility-administered medications for this visit.     Allergies:   Aspirin    Social History:  The patient  reports that she has never smoked. She has never used smokeless tobacco. She reports that she does not drink alcohol or use drugs.   Family History:  The patient's family history includes Hyperlipidemia in her mother; Hypertension in her father and mother.    ROS:  Please see the history of present illness.   Otherwise, review of systems are positive for none.   All other systems are reviewed and negative.    PHYSICAL EXAM: VS:  BP 126/88 (BP Location: Left Arm, Patient Position: Sitting, Cuff Size: Normal)   Pulse 79   Ht 5\' 3"  (1.6 m)   Wt (!) 373 lb 8 oz (169.4 kg)   LMP 08/02/2016   BMI 66.16 kg/m  , BMI Body mass index is 66.16 kg/m. GEN: Well nourished, well developed, in no acute distress  HEENT: normal  Neck: no JVD, carotid bruits, or masses Cardiac: RRR; norubs, or gallops, trace edema . One out of 6 systolic ejection murmur at  the base of the heart  Respiratory:  clear to auscultation bilaterally, normal work of breathing GI: soft, nontender, nondistended, + BS MS: no deformity or atrophy  Skin: warm and dry, no rash Neuro:  Strength and sensation are intact Psych: euthymic mood, full affect   EKG:  EKG is ordered today. The ekg ordered today demonstrates normal sinus rhythm with left bundle branch block.   Recent Labs: 08/16/2016: ALT 21; BUN 12; Creatinine, Ser 0.54; Hemoglobin 12.8; Platelets 254; Potassium 4.0; Sodium 136    Lipid Panel    Component Value Date/Time   CHOL 165 02/07/2015 1012   TRIG 96 02/07/2015 1012   HDL 62 02/07/2015 1012    CHOLHDL 2.7 02/07/2015 1012   LDLCALC 84 02/07/2015 1012      Wt Readings from Last 3 Encounters:  08/27/16 (!) 373 lb 8 oz (169.4 kg)  08/16/16 (!) 350 lb (158.8 kg)  08/30/15 (!) 376 lb 8 oz (170.8 kg)       ASSESSMENT AND PLAN:  1.  Paroxysmal atrial tachycardia: No evidence of recurrent arrhythmia. Continue current dose of carvedilol.  2. Essential hypertension: Blood pressure is well controlled  On carvedilol.  3. Morbid obesity:Unfortunately, her insurance doesn't cover weight loss surgery. I discussed the importance of diet and exercise including considering water aerobics.   4. Obstructive sleep apnea: Continue treatment with CPAP.    Disposition:   FU with me in 1 year  Signed,  Lorine Bears, MD  08/27/2016 2:54 PM    [facility]

## 2016-08-27 NOTE — Patient Instructions (Signed)
Medication Instructions: No change    Labwork: None.   Procedures/Testing: None.   Follow-Up: 1 year with Dr. Arida.   Any Additional Special Instructions Will Be Listed Below (If Applicable).     If you need a refill on your cardiac medications before your next appointment, please call your pharmacy.   

## 2016-08-28 ENCOUNTER — Telehealth: Payer: Self-pay

## 2016-08-28 NOTE — Telephone Encounter (Signed)
FMLA documentation have been received. Employer is wanting them completed if possible by 09/05/16.  FMLA Paperwork has been received and a $25.00 collection fee has been obtained.

## 2016-08-30 ENCOUNTER — Encounter: Payer: Self-pay | Admitting: Surgery

## 2016-08-30 ENCOUNTER — Ambulatory Visit (INDEPENDENT_AMBULATORY_CARE_PROVIDER_SITE_OTHER): Payer: Managed Care, Other (non HMO) | Admitting: Surgery

## 2016-08-30 VITALS — BP 179/80 | HR 94 | Temp 98.1°F | Ht 63.0 in | Wt 375.0 lb

## 2016-08-30 DIAGNOSIS — G4730 Sleep apnea, unspecified: Secondary | ICD-10-CM

## 2016-08-30 DIAGNOSIS — K353 Acute appendicitis with localized peritonitis, without perforation or gangrene: Secondary | ICD-10-CM

## 2016-08-30 NOTE — Patient Instructions (Signed)
Please call our office with any questions or concerns.  Please do not submerge in a tub, hot tub, or pool until incisions are completely sealed.  Use sun block to incision area over the next year if this area will be exposed to sun. This helps decrease scarring.  You may resume your normal activities on 09/27/16. At that time- Listen to your body when lifting, if you have pain when lifting, stop and then try again in a few days. Pain after doing exercises or activities of daily living is normal as you get back in to your normal routine.  If you develop redness, drainage, or pain at incision sites- call our office immediately and speak with a nurse.

## 2016-08-30 NOTE — Progress Notes (Signed)
Surgical Clinic Progress/Follow-up Note   HPI:  46 y.o. yo Female presents to clinic for post-op follow-up evaluation 2 weeks s/p uncomplicated laparoscopic appendectomy for acute suppurative non-perforated appendicitis. Patient reports near-complete resolution of minimal LLQ abdominal pain while tolerating regular diet with +flatus and +BM WNL without lifting >20 lbs, denies fever/chills, CP, or SOB.  Review of Systems:  Constitutional: denies any other weight loss, fever, chills, or sweats  Eyes: denies any other vision changes, history of eye injury  ENT: denies sore throat, hearing problems  Respiratory: denies shortness of breath, wheezing  Cardiovascular: denies chest pain, palpitations  Gastrointestinal: abdominal pain, N/V, and bowel function as per HPI Musculoskeletal: denies any other joint pains or cramps  Skin: Denies any other rashes or skin discolorations  Neurological: denies any other headache, dizziness, weakness  Psychiatric: denies any other depression, anxiety  All other review of systems: otherwise negative   Vital Signs:  BP (!) 179/80   Pulse 94   Temp 98.1 F (36.7 C) (Oral)   Ht 5\' 3"  (1.6 m)   Wt (!) 375 lb (170.1 kg)   LMP 08/18/2016   BMI 66.43 kg/m    Physical Exam:  Constitutional:  -- Obese body habitus  -- Awake, alert, and oriented x3  Eyes:  -- Pupils equally round and reactive to light  -- No scleral icterus  Ear, nose, throat:  -- No jugular venous distension  -- No nasal drainage, bleeding Pulmonary:  -- No crackles  -- No dullness to percussion  Cardiovascular:  -- S1, S2 present  -- No pericardial rubs  Gastrointestinal:  -- Soft, obese, nontender, nondistended, no guarding/rebound -- Post-laparoscopy surgical incisions well-approximated without erythema or drainage -- No abdominal masses appreciated, pulsatile or otherwise  Musculoskeletal / Integumentary:  -- Wounds or skin discoloration: None except as described above  (GI)  -- Extremities: B/L UE and LE FROM, hands and feet warm  Neurologic:  -- Motor function: intact and symmetric  -- Sensation: intact and symmetric   Assessment:  46 y.o. yo Female with a problem list including...  Patient Active Problem List   Diagnosis Date Noted  . Sleep apnea   . Appendicitis, acute 08/16/2016  . Pain in joint, lower leg 01/12/2014  . Obesity   . Hypertension   . Atrial tachycardia, paroxysmal (HCC)   . LBBB (left bundle branch block)     presents to clinic for post-op follow-up evaluation, doing well 2 weeks s/p uncomplicated laparoscopic appendectomy for acute suppurative non-perforated appendicitis.  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  -- Scherrie GerlachJason E. Earlene Plateravis, MD, RPVI [place]: [facility] - Partnering for exceptional care. Office: [phone]

## 2016-09-04 NOTE — Telephone Encounter (Signed)
I filled out patient's FMLA Forms and some of her Disability Forms since she needs to finish her part. Then her disability form could be faxed.  Please call patient and let her know that she needs to fill out

## 2016-09-05 NOTE — Telephone Encounter (Signed)
Patient came in and took the documentation's with her. I let her know that we will need a copy of the completed disability forms.

## 2016-09-05 NOTE — Telephone Encounter (Signed)
I have contacted the patient. She will be by the office today to pick up the disability paperwork that needs to be completed by her and her employer. She will also pick up a copy of her FMLA at that time.

## 2016-09-06 ENCOUNTER — Telehealth: Payer: Self-pay | Admitting: Cardiovascular Disease

## 2016-09-06 MED ORDER — FUROSEMIDE 20 MG PO TABS: 20.0000 mg | ORAL_TABLET | Freq: Every day | ORAL | 6 refills | Status: DC | PRN

## 2016-09-06 NOTE — Telephone Encounter (Signed)
Pt c/o swelling: STAT is pt has developed SOB within 24 hours  1. How long have you been experiencing swelling? Last 3-4 days  2. Where is the swelling located? Bilateral knees down into her feet  3.  Are you currently taking a "fluid pill"?no, needs refill on Lasix  4.  Are you currently SOB? No   5.  Have you traveled recently? no  Pt states she had her appendix out about 3 weeks ago, she is not sure if the swelling is related to this.

## 2016-09-06 NOTE — Telephone Encounter (Signed)
Spoke with patient. She started having swelling bilaterally from knees down to feet for the past 3-4 days. Denies chest pain, SOB, or dizziness. She's been elevating her legs and it helps a little but not much. In the past she took furosemide 20 mg daily as needed but hasn't needed it in a while and has run out. Patient last saw Dr Kirke CorinArida on 08/27/16 and recent appendectomy about 3 weeks ago. Patient would like to know if Dr Kirke CorinArida would order her furosemide again for this. Routing to Dr Kirke CorinArida for review.

## 2016-09-06 NOTE — Telephone Encounter (Signed)
Returned call to patient. Verified pharmacy and Rx sent for furosemide. She verbalized understanding.

## 2016-09-06 NOTE — Telephone Encounter (Signed)
This encounter was created in error - please disregard.

## 2016-09-06 NOTE — Telephone Encounter (Signed)
Can resume furosemide 20 mg daily as needed.

## 2016-09-19 DIAGNOSIS — M1712 Unilateral primary osteoarthritis, left knee: Secondary | ICD-10-CM | POA: Insufficient documentation

## 2016-09-19 DIAGNOSIS — M1710 Unilateral primary osteoarthritis, unspecified knee: Secondary | ICD-10-CM

## 2016-10-15 ENCOUNTER — Other Ambulatory Visit: Payer: Self-pay | Admitting: Cardiovascular Disease

## 2016-12-07 ENCOUNTER — Encounter: Payer: Self-pay | Admitting: Physician Assistant

## 2016-12-07 ENCOUNTER — Ambulatory Visit: Payer: Self-pay | Admitting: Physician Assistant

## 2016-12-07 VITALS — BP 139/90 | HR 74 | Temp 98.5°F | Resp 16

## 2016-12-07 DIAGNOSIS — J028 Acute pharyngitis due to other specified organisms: Secondary | ICD-10-CM

## 2016-12-07 DIAGNOSIS — B9789 Other viral agents as the cause of diseases classified elsewhere: Secondary | ICD-10-CM

## 2016-12-07 DIAGNOSIS — Z299 Encounter for prophylactic measures, unspecified: Secondary | ICD-10-CM

## 2016-12-07 NOTE — Progress Notes (Signed)
S: c/o sore throat on left side of throat, no fever/chills, no cough or congestion, no v/d; also concerned she might have diabetes, states she has swelling on the optic nerve and the eye specialist said it could indicate that she has diabetes  O: vitals wnl, nad, tms clear, throat red with ulcer on left, neck supple no lymph, lungs c t a, cv rrr  A: acute pharyngitis, viral  P: dukes, labs

## 2016-12-08 LAB — CMP12+LP+TP+TSH+6AC+CBC/D/PLT
ALK PHOS: 95 IU/L (ref 39–117)
ALT: 16 IU/L (ref 0–32)
AST: 12 IU/L (ref 0–40)
Albumin/Globulin Ratio: 1.4 (ref 1.2–2.2)
Albumin: 3.9 g/dL (ref 3.5–5.5)
BASOS: 0 %
BUN / CREAT RATIO: 11 (ref 9–23)
BUN: 9 mg/dL (ref 6–24)
Basophils Absolute: 0 10*3/uL (ref 0.0–0.2)
Bilirubin Total: 0.7 mg/dL (ref 0.0–1.2)
CHOL/HDL RATIO: 2.8 ratio (ref 0.0–4.4)
CREATININE: 0.81 mg/dL (ref 0.57–1.00)
Calcium: 8.8 mg/dL (ref 8.7–10.2)
Chloride: 99 mmol/L (ref 96–106)
Cholesterol, Total: 167 mg/dL (ref 100–199)
EOS (ABSOLUTE): 0.1 10*3/uL (ref 0.0–0.4)
EOS: 1 %
Estimated CHD Risk: <0.5 times avg. (ref 0.0–1.0)
Free Thyroxine Index: 1.9 (ref 1.2–4.9)
GFR calc non Af Amer: 86 mL/min/{1.73_m2} (ref >59)
GFR, EST AFRICAN AMERICAN: 99 mL/min/{1.73_m2} (ref >59)
GGT: 14 IU/L (ref 0–60)
GLOBULIN, TOTAL: 2.7 g/dL (ref 1.5–4.5)
GLUCOSE: 115 mg/dL — AB (ref 65–99)
HDL: 59 mg/dL (ref >39)
HEMATOCRIT: 38.1 % (ref 34.0–46.6)
HEMOGLOBIN: 12.4 g/dL (ref 11.1–15.9)
IMMATURE GRANS (ABS): 0 10*3/uL (ref 0.0–0.1)
Immature Granulocytes: 0 %
Iron: 62 ug/dL (ref 27–159)
LDH: 190 IU/L (ref 119–226)
LDL CALC: 77 mg/dL (ref 0–99)
LYMPHS: 21 %
Lymphocytes Absolute: 1.6 10*3/uL (ref 0.7–3.1)
MCH: 26.2 pg — ABNORMAL LOW (ref 26.6–33.0)
MCHC: 32.5 g/dL (ref 31.5–35.7)
MCV: 80 fL (ref 79–97)
MONOCYTES: 5 %
Monocytes Absolute: 0.4 10*3/uL (ref 0.1–0.9)
NEUTROS ABS: 5.3 10*3/uL (ref 1.4–7.0)
Neutrophils: 73 %
PHOSPHORUS: 3.2 mg/dL (ref 2.5–4.5)
POTASSIUM: 4.2 mmol/L (ref 3.5–5.2)
Platelets: 280 10*3/uL (ref 150–379)
RBC: 4.74 x10E6/uL (ref 3.77–5.28)
RDW: 15 % (ref 12.3–15.4)
SODIUM: 140 mmol/L (ref 134–144)
T3 Uptake Ratio: 22 % — ABNORMAL LOW (ref 24–39)
T4, Total: 8.5 ug/dL (ref 4.5–12.0)
TRIGLYCERIDES: 157 mg/dL — AB (ref 0–149)
TSH: 1.48 u[IU]/mL (ref 0.450–4.500)
Total Protein: 6.6 g/dL (ref 6.0–8.5)
URIC ACID: 4.5 mg/dL (ref 2.5–7.1)
VLDL Cholesterol Cal: 31 mg/dL (ref 5–40)
WBC: 7.3 10*3/uL (ref 3.4–10.8)

## 2016-12-08 LAB — HGB A1C W/O EAG: Hgb A1c MFr Bld: 5.6 % (ref 4.8–5.6)

## 2016-12-12 ENCOUNTER — Other Ambulatory Visit: Payer: Self-pay | Admitting: Cardiovascular Disease

## 2017-02-09 ENCOUNTER — Other Ambulatory Visit: Payer: Self-pay | Admitting: Cardiovascular Disease

## 2017-02-22 ENCOUNTER — Ambulatory Visit: Payer: Self-pay | Admitting: Physician Assistant

## 2017-02-22 DIAGNOSIS — Z299 Encounter for prophylactic measures, unspecified: Secondary | ICD-10-CM

## 2017-02-22 NOTE — Progress Notes (Signed)
Patient came in to have her flu vaccine.

## 2017-04-22 ENCOUNTER — Encounter: Payer: Self-pay | Admitting: Family

## 2017-04-22 ENCOUNTER — Ambulatory Visit: Payer: Self-pay | Admitting: Family

## 2017-04-22 VITALS — BP 110/80 | HR 82 | Temp 98.4°F | Resp 16

## 2017-04-22 DIAGNOSIS — J069 Acute upper respiratory infection, unspecified: Secondary | ICD-10-CM

## 2017-04-22 MED ORDER — AMOXICILLIN 875 MG PO TABS: 875.0000 mg | ORAL_TABLET | Freq: Two times a day (BID) | ORAL | 0 refills | Status: DC

## 2017-04-22 NOTE — Progress Notes (Signed)
Subjective: 4 day history of upper respiratory symptoms began as a sore throat, scratchy ,with malaise, did not respond to Flonase or Claritin, then chest congestion and productive cough, denies fever, chest pain or shortness of breath, negative history for asthma Objective: Vital signs stable alert pleasant NAD  ENT: TMs dull, nasal turbinates erythematous very swollen, nontender sinuses to palpation, pharynx unremarkable, neck supple without adenopathy, heart RSR, lungs are clear Assessment: Upper respiratory infection  Plan supportive measures discussed, Nasal saline products bid and prn Rx amoxicillin to have on hand to initiate after 7 days. Follow-up when necessary not improving

## 2017-06-15 ENCOUNTER — Other Ambulatory Visit: Payer: Self-pay

## 2017-06-15 ENCOUNTER — Encounter: Payer: Self-pay | Admitting: Emergency Medicine

## 2017-06-15 ENCOUNTER — Emergency Department: Payer: Worker's Compensation

## 2017-06-15 ENCOUNTER — Emergency Department
Admission: EM | Admit: 2017-06-15 | Discharge: 2017-06-15 | Disposition: A | Discharge status: Home / Self Care | Payer: Worker's Compensation | Attending: Emergency Medicine | Admitting: Emergency Medicine

## 2017-06-15 DIAGNOSIS — I10 Essential (primary) hypertension: Secondary | ICD-10-CM | POA: Diagnosis not present

## 2017-06-15 DIAGNOSIS — W500XXA Accidental hit or strike by another person, initial encounter: Secondary | ICD-10-CM | POA: Insufficient documentation

## 2017-06-15 DIAGNOSIS — Y929 Unspecified place or not applicable: Secondary | ICD-10-CM | POA: Diagnosis not present

## 2017-06-15 DIAGNOSIS — M25521 Pain in right elbow: Secondary | ICD-10-CM | POA: Diagnosis not present

## 2017-06-15 DIAGNOSIS — Y990 Civilian activity done for income or pay: Secondary | ICD-10-CM | POA: Diagnosis not present

## 2017-06-15 DIAGNOSIS — W19XXXA Unspecified fall, initial encounter: Secondary | ICD-10-CM

## 2017-06-15 DIAGNOSIS — S99911A Unspecified injury of right ankle, initial encounter: Secondary | ICD-10-CM | POA: Diagnosis present

## 2017-06-15 DIAGNOSIS — Y9301 Activity, walking, marching and hiking: Secondary | ICD-10-CM | POA: Insufficient documentation

## 2017-06-15 DIAGNOSIS — S93401A Sprain of unspecified ligament of right ankle, initial encounter: Secondary | ICD-10-CM | POA: Diagnosis not present
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
## 2017-06-15 NOTE — Discharge Instructions (Signed)
1.  Elevate affected area and apply ice over splint several times daily to reduce swelling. 2.  Use crutches as needed for comfort; you may bear weight as tolerated. 3.  Return to the ER for worsening symptoms, increased swelling, difficulty breathing or other concerns.

## 2017-06-15 NOTE — ED Notes (Signed)
Per supervisor, [place], no UDS required at this time. Supervisor present at this time and verbally answered this question.
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
## 2017-06-15 NOTE — ED Notes (Signed)

## 2017-06-16 ENCOUNTER — Other Ambulatory Visit: Payer: Self-pay | Admitting: Cardiovascular Disease

## 2017-06-20 DIAGNOSIS — G4730 Sleep apnea, unspecified: Secondary | ICD-10-CM | POA: Insufficient documentation

## 2017-06-21 ENCOUNTER — Ambulatory Visit: Payer: Self-pay | Admitting: Medical

## 2017-06-21 VITALS — BP 146/100 | HR 73 | Temp 98.7°F | Resp 16

## 2017-06-21 DIAGNOSIS — R05 Cough: Secondary | ICD-10-CM

## 2017-06-21 DIAGNOSIS — R059 Cough, unspecified: Secondary | ICD-10-CM

## 2017-06-21 DIAGNOSIS — J069 Acute upper respiratory infection, unspecified: Secondary | ICD-10-CM

## 2017-06-21 MED ORDER — DOXYCYCLINE HYCLATE 100 MG PO TABS: 100.0000 mg | ORAL_TABLET | Freq: Two times a day (BID) | ORAL | 0 refills | Status: DC

## 2017-06-21 MED ORDER — BENZONATATE 100 MG PO CAPS: 100.0000 mg | ORAL_CAPSULE | Freq: Three times a day (TID) | ORAL | 0 refills | Status: DC | PRN

## 2017-06-21 NOTE — Patient Instructions (Signed)
Upper Respiratory Infection, Adult Most upper respiratory infections (URIs) are caused by a virus. A URI affects the nose, throat, and upper air passages. The most common type of URI is often called "the common cold." Follow these instructions at home:  Take medicines only as told by your doctor.  Gargle warm saltwater or take cough drops to comfort your throat as told by your doctor.  Use a warm mist humidifier or inhale steam from a shower to increase air moisture. This may make it easier to breathe.  Drink enough fluid to keep your pee (urine) clear or pale yellow.  Eat soups and other clear broths.  Have a healthy diet.  Rest as needed.  Go back to work when your fever is gone or your doctor says it is okay. ? You may need to stay home longer to avoid giving your URI to others. ? You can also wear a face mask and wash your hands often to prevent spread of the virus.  Use your inhaler more if you have asthma.  Do not use any tobacco products, including cigarettes, chewing tobacco, or electronic cigarettes. If you need help quitting, ask your doctor. Contact a doctor if:  You are getting worse, not better.  Your symptoms are not helped by medicine.  You have chills.  You are getting more short of breath.  You have brown or red mucus.  You have yellow or brown discharge from your nose.  You have pain in your face, especially when you bend forward.  You have a fever.  You have puffy (swollen) neck glands.  You have pain while swallowing.  You have white areas in the back of your throat. Get help right away if:  You have very bad or constant: ? Headache. ? Ear pain. ? Pain in your forehead, behind your eyes, and over your cheekbones (sinus pain). ? Chest pain.  You have long-lasting (chronic) lung disease and any of the following: ? Wheezing. ? Long-lasting cough. ? Coughing up blood. ? A change in your usual mucus.  You have a stiff neck.  You have  changes in your: ? Vision. ? Hearing. ? Thinking. ? Mood. This information is not intended to replace advice given to you by your health care provider. Make sure you discuss any questions you have with your health care provider. Document Released: 09/26/2007 Document Revised: 12/11/2015 Document Reviewed: 07/15/2013 Elsevier Interactive Patient Education  2018 Elsevier Inc[employer] Cough, Adult A cough helps to clear your throat and lungs. A cough may last only 2-3 weeks (acute), or it may last longer than 8 weeks (chronic). Many different things can cause a cough. A cough may be a sign of an illness or another medical condition. Follow these instructions at home:  Pay attention to any changes in your cough.  Take medicines only as told by your doctor. ? If you were prescribed an antibiotic medicine, take it as told by your doctor. Do not stop taking it even if you start to feel better. ? Talk with your doctor before you try using a cough medicine.  Drink enough fluid to keep your pee (urine) clear or pale yellow.  If the air is dry, use a cold steam vaporizer or humidifier in your home.  Stay away from things that make you cough at work or at home.  If your cough is worse at night, try using extra pillows to raise your head up higher while you sleep.  Do not smoke, and try not   to be around smoke. If you need help quitting, ask your doctor.  Do not have caffeine.  Do not drink alcohol.  Rest as needed. Contact a doctor if:  You have new problems (symptoms).  You cough up yellow fluid (pus).  Your cough does not get better after 2-3 weeks, or your cough gets worse.  Medicine does not help your cough and you are not sleeping well.  You have pain that gets worse or pain that is not helped with medicine.  You have a fever.  You are losing weight and you do not know why.  You have night sweats. Get help right away if:  You cough up blood.  You have trouble breathing.  Your  heartbeat is very fast. This information is not intended to replace advice given to you by your health care provider. Make sure you discuss any questions you have with your health care provider. Document Released: 12/21/2010 Document Revised: 09/15/2015 Document Reviewed: 06/16/2014 Elsevier Interactive Patient Education  2018 Elsevier Inc.  
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
## 2017-07-03 DIAGNOSIS — N898 Other specified noninflammatory disorders of vagina: Secondary | ICD-10-CM | POA: Insufficient documentation

## 2017-07-03 DIAGNOSIS — N83201 Unspecified ovarian cyst, right side: Secondary | ICD-10-CM | POA: Insufficient documentation

## 2017-07-03 DIAGNOSIS — N938 Other specified abnormal uterine and vaginal bleeding: Secondary | ICD-10-CM | POA: Insufficient documentation

## 2017-07-19 ENCOUNTER — Other Ambulatory Visit: Payer: Self-pay | Admitting: Family Medicine

## 2017-07-19 ENCOUNTER — Telehealth: Payer: Self-pay

## 2017-07-19 DIAGNOSIS — K219 Gastro-esophageal reflux disease without esophagitis: Secondary | ICD-10-CM

## 2017-07-19 MED ORDER — OMEPRAZOLE 20 MG PO CPDR: 20.0000 mg | DELAYED_RELEASE_CAPSULE | Freq: Every day | ORAL | 1 refills | Status: DC

## 2017-07-19 NOTE — Telephone Encounter (Signed)
Pt. Called requesting refill on Omeprazole 20mg . Pt states she cannot come in for an appointment until Friday 07/26/17. Would like 30 day supply sent in until she can make an appointment, pt has no PCP.  Last OV- URI: 06/21/17 Last Lab- 12/07/16 Last dispensed- 03/23/16, #30, 12 refills

## 2017-07-19 NOTE — Progress Notes (Signed)
Patient called requesting a refill for omeprazole.  Provided patient with 2 months worth of this medications so that she has time to establish care with new primary care provider, who will be responsible for refilling this medication in the future.

## 2017-09-19 DIAGNOSIS — Z0189 Encounter for other specified special examinations: Secondary | ICD-10-CM

## 2017-09-19 DIAGNOSIS — K219 Gastro-esophageal reflux disease without esophagitis: Secondary | ICD-10-CM | POA: Insufficient documentation

## 2017-09-19 DIAGNOSIS — M549 Dorsalgia, unspecified: Secondary | ICD-10-CM | POA: Insufficient documentation

## 2017-09-19 DIAGNOSIS — Z008 Encounter for other general examination: Secondary | ICD-10-CM | POA: Insufficient documentation

## 2017-10-22 ENCOUNTER — Encounter: Payer: Self-pay | Admitting: Nurse Practitioner

## 2017-10-22 ENCOUNTER — Ambulatory Visit (INDEPENDENT_AMBULATORY_CARE_PROVIDER_SITE_OTHER): Payer: Managed Care, Other (non HMO) | Admitting: Nurse Practitioner

## 2017-10-22 VITALS — BP 150/80 | HR 66 | Ht 63.0 in | Wt 385.2 lb

## 2017-10-22 DIAGNOSIS — I10 Essential (primary) hypertension: Secondary | ICD-10-CM

## 2017-10-22 DIAGNOSIS — I471 Supraventricular tachycardia: Secondary | ICD-10-CM

## 2017-10-22 MED ORDER — CARVEDILOL 12.5 MG PO TABS: 12.5000 mg | ORAL_TABLET | Freq: Two times a day (BID) | ORAL | 3 refills | Status: DC

## 2017-10-22 NOTE — Progress Notes (Signed)
Office Visit    Patient Name: [name] [name] Date of Encounter: 10/22/2017  Primary Care Provider:  Olive Bass, MD Primary Cardiologist:  Lorine Bears, MD  Chief Complaint    47 year old female with a history of paroxysmal atrial tachycardia, hypertension, left bundle branch block, obesity, and sleep apnea, who presents for follow-up.  Past Medical History    Past Medical History:  Diagnosis Date  . Appendicitis 08/16/2016  . Atrial tachycardia, paroxysmal (HCC)    a. s/p ablation in 2009 by Dr. Chales Abrahams in [facility] (falied); b. Managed w/ beta blocker.  . Hypertension   . LBBB (left bundle branch block)   . Obesity   . Sleep apnea    Past Surgical History:  Procedure Laterality Date  . ABLATION OF DYSRHYTHMIC FOCUS  2009  . ANTERIOR CRUCIATE LIGAMENT REPAIR    . LAPAROSCOPIC APPENDECTOMY N/A 08/16/2016   Procedure: APPENDECTOMY LAPAROSCOPIC;  Surgeon: Lattie Haw, MD;  Location: [facility] ORS;  Service: General;  Laterality: N/A;  . TONSILLECTOMY      Allergies  Allergies  Allergen Reactions  . Aspirin Swelling    History of Present Illness    47 year old female with the above past medical history including hypertension, obesity, and sleep apnea.  In 2009, she was diagnosed with paroxysmal atrial tachycardia and underwent unsuccessful catheter ablation by Dr. Chales Abrahams in [facility].  Following ablation, she developed a left bundle branch block.  Atrial tachycardia and symptoms of palpitations have been successfully treated over the years with carvedilol therapy.  She was last seen in May 2018, and since then is done quite well.  She very rarely has an episode of palpitations lasting just a few seconds and resolving spontaneously.  Since she was last seen, she changed jobs within [employer] and is now working with heart failure patients in the community.  She is working closely with Dr. Mariah Milling on this effort.  She is excited about this change as it allows her have  more steady hours and in that setting, she has been actively trying to lose weight.  She is watching her diet much more closely and eliminating simple carbs, sugary drinks, and significantly cutting down the amount of fast food she consumes.  She denies chest pain, palpitations, dyspnea, PND, orthopnea, dizziness, syncope, or early satiety.  Occasional lower extremity swelling premenses.  Home Medications    Prior to Admission medications   Medication Sig Start Date End Date Taking? Authorizing Provider  carvedilol (COREG) 12.5 MG tablet take 1 tablet by mouth twice a day 06/17/17  Yes Iran Ouch, MD  fluticasone (FLONASE) 50 MCG/ACT nasal spray Place 2 sprays into both nostrils daily. 05/28/16  Yes Faythe Ghee, PA-C  gabapentin (NEURONTIN) 300 MG capsule  03/21/17  Yes [provider]  meloxicam (MOBIC) 15 MG tablet Take 1 tablet (15 mg total) by mouth daily. 02/07/15  Yes Joni Reining, PA-C  pantoprazole (PROTONIX) 40 MG tablet Take by mouth. 09/19/17  Yes [provider]    Review of Systems    Rare, brief palpitations.  Occasional lower extremity swelling, especially when she is premenstrual.  She denies chest pain, dyspnea, pnd, orthopnea, n, v, dizziness, syncope, weight gain, or early satiety.  All other systems reviewed and are otherwise negative except as noted above.  Physical Exam    VS:  BP (!) 150/80 (BP Location: Left Arm, Patient Position: Sitting, Cuff Size: Large)   Pulse 66   Ht 5\' 3"  (1.6  m)   Wt (!) 385 lb 4 oz (174.7 kg)   BMI 68.24 kg/m  , BMI Body mass index is 68.24 kg/m. GEN: Obese, in no acute distress.  HEENT: normal.  Neck: Supple, no carotid bruits, or masses. Cardiac: RRR, soft insignificant murmur @ RUSB, no rubs, or gallops. No clubbing, cyanosis, edema.  Radials/DP/PT 2+ and equal bilaterally.  Respiratory:  Respirations regular and unlabored, clear to auscultation bilaterally. GI: Soft, nontender, nondistended, BS + x  4. MS: no deformity or atrophy. Skin: warm and dry, no rash. Neuro:  Strength and sensation are intact. Psych: Normal affect.  Accessory Clinical Findings    ECG -regular sinus rhythm, 66, left bundle branch block, no acute changes.  Assessment & Plan    1.  Paroxysmal atrial tachycardia: Doing well over the past year on carvedilol therapy.  Relatively rare and very brief episodes of palpitations.  Continue current dose of beta-blocker.  2.  Essential hypertension: Blood pressure elevated today at 150/80.  She says that when she has had her blood pressure checked over the past year, it has been pretty variable anywhere between the 120s 140s.  I suspect she is trending higher than we would prefer.  We discussed possibly titrating her beta-blocker  to 18.75 mg twice daily versus adding a low-dose of a diuretic in the setting of occasional lower extremity swelling.  She is actively trying to lose weight and significantly changing her diet and cutting out salt.   She prefers to hold off on any change to medications to see if she can get her blood pressure under better control weight loss and has also agreed to check her blood pressure regularly and will contact us if she is trending greater than 130.  I think this is a reasonable plan.  3.  Morbid obesity: Patient is actively trying to lose weight as above.  I think she has a good plan and have encouraged her to stick with it.  4.  Obstructive sleep apnea: Uses CPAP.  5.  Disposition: Follow-up in 1 year or sooner if necessary.  Nicolasa Duckinghristopher Reegan Mctighe, NP 10/22/2017, 12:00 PM

## 2017-10-22 NOTE — Patient Instructions (Signed)
Medication Instructions:  Refill sent in for carvedilol   Follow-Up: Your physician wants you to follow-up in: 1 year with Dr. Kirke CorinArida. You will receive a reminder letter in the mail two months in advance. If you don't receive a letter, please call our office to schedule the follow-up appointment.  It was a pleasure seeing you today here in the office. Please do not hesitate to give us a call back if you have any further questions. [phone]  Felida CellarPamela A. RN, BSN

## 2017-11-20 DIAGNOSIS — S93409A Sprain of unspecified ligament of unspecified ankle, initial encounter: Secondary | ICD-10-CM | POA: Insufficient documentation

## 2018-06-16 ENCOUNTER — Ambulatory Visit: Payer: Self-pay | Admitting: Adult Health

## 2018-06-16 VITALS — BP 138/78 | HR 65 | Temp 99.0°F | Resp 16

## 2018-06-16 DIAGNOSIS — J020 Streptococcal pharyngitis: Secondary | ICD-10-CM

## 2018-06-16 DIAGNOSIS — J029 Acute pharyngitis, unspecified: Secondary | ICD-10-CM

## 2018-06-16 LAB — POCT RAPID STREP A (OFFICE): Rapid Strep A Screen: POSITIVE — AB

## 2018-06-16 MED ORDER — AMOXICILLIN 875 MG PO TABS: 875.0000 mg | ORAL_TABLET | Freq: Two times a day (BID) | ORAL | 0 refills | Status: DC

## 2018-06-16 NOTE — Patient Instructions (Signed)
Amoxicillin capsules or tablets What is this medicine? AMOXICILLIN (a mox i SIL in) is a penicillin antibiotic. It is used to treat certain kinds of bacterial infections. It will not work for colds, flu, or other viral infections. This medicine may be used for other purposes; ask your health care provider or pharmacist if you have questions. COMMON BRAND NAME(S): Amoxil, Moxilin, Sumox, Trimox What should I tell my health care provider before I take this medicine? They need to know if you have any of these conditions: -asthma -kidney disease -an unusual or allergic reaction to amoxicillin, other penicillins, cephalosporin antibiotics, other medicines, foods, dyes, or preservatives -pregnant or trying to get pregnant -breast-feeding How should I use this medicine? Take this medicine by mouth with a glass of water. Follow the directions on your prescription label. You may take this medicine with food or on an empty stomach. Take your medicine at regular intervals. Do not take your medicine more often than directed. Take all of your medicine as directed even if you think your are better. Do not skip doses or stop your medicine early. Talk to your pediatrician regarding the use of this medicine in children. While this drug may be prescribed for selected conditions, precautions do apply. Overdosage: If you think you have taken too much of this medicine contact a poison control center or emergency room at once. NOTE: This medicine is only for you. Do not share this medicine with others. What if I miss a dose? If you miss a dose, take it as soon as you can. If it is almost time for your next dose, take only that dose. Do not take double or extra doses. What may interact with this medicine? -amiloride -birth control pills -chloramphenicol -macrolides -probenecid -sulfonamides -tetracyclines This list may not describe all possible interactions. Give your health care provider a list of all the  medicines, herbs, non-prescription drugs, or dietary supplements you use. Also tell them if you smoke, drink alcohol, or use illegal drugs. Some items may interact with your medicine. What should I watch for while using this medicine? Tell your doctor or health care professional if your symptoms do not improve in 2 or 3 days. Take all of the doses of your medicine as directed. Do not skip doses or stop your medicine early. If you are diabetic, you may get a false positive result for sugar in your urine with certain brands of urine tests. Check with your doctor. Do not treat diarrhea with over-the-counter products. Contact your doctor if you have diarrhea that lasts more than 2 days or if the diarrhea is severe and watery. What side effects may I notice from receiving this medicine? Side effects that you should report to your doctor or health care professional as soon as possible: -allergic reactions like skin rash, itching or hives, swelling of the face, lips, or tongue -breathing problems -dark urine -redness, blistering, peeling or loosening of the skin, including inside the mouth -seizures -severe or watery diarrhea -trouble passing urine or change in the amount of urine -unusual bleeding or bruising -unusually weak or tired -yellowing of the eyes or skin Side effects that usually do not require medical attention (report to your doctor or health care professional if they continue or are bothersome): -dizziness -headache -stomach upset -trouble sleeping This list may not describe all possible side effects. Call your doctor for medical advice about side effects. You may report side effects to FDA at 1-800-FDA-1088. Where should I keep my medicine? Keep out   of the reach of children. Store between 68 and 77 degrees F (20 and 25 degrees C). Keep bottle closed tightly. Throw away any unused medicine after the expiration date. NOTE: This sheet is a summary. It may not cover all possible  information. If you have questions about this medicine, talk to your doctor, pharmacist, or health care provider.  2019 Elsevier/Gold Standard (2007-07-01 14:10:59) Pharyngitis  Pharyngitis is a sore throat (pharynx). This is when there is redness, pain, and swelling in your throat. Most of the time, this condition gets better on its own. In some cases, you may need medicine. Follow these instructions at home:  Take over-the-counter and prescription medicines only as told by your doctor. ? If you were prescribed an antibiotic medicine, take it as told by your doctor. Do not stop taking the antibiotic even if you start to feel better. ? Do not give children aspirin. Aspirin has been linked to Reye syndrome.  Drink enough water and fluids to keep your pee (urine) clear or pale yellow.  Get a lot of rest.  Rinse your mouth (gargle) with a salt-water mixture 3-4 times a day or as needed. To make a salt-water mixture, completely dissolve -1 tsp of salt in 1 cup of warm water.  If your doctor approves, you may use throat lozenges or sprays to soothe your throat. Contact a doctor if:  You have large, tender lumps in your neck.  You have a rash.  You cough up green, yellow-brown, or bloody spit. Get help right away if:  You have a stiff neck.  You drool or cannot swallow liquids.  You cannot drink or take medicines without throwing up.  You have very bad pain that does not go away with medicine.  You have problems breathing, and it is not from a stuffy nose.  You have new pain and swelling in your knees, ankles, wrists, or elbows. Summary  Pharyngitis is a sore throat (pharynx). This is when there is redness, pain, and swelling in your throat.  If you were prescribed an antibiotic medicine, take it as told by your doctor. Do not stop taking the antibiotic even if you start to feel better.  Most of the time, pharyngitis gets better on its own. Sometimes, you may need  medicine. This information is not intended to replace advice given to you by your health care provider. Make sure you discuss any questions you have with your health care provider. Document Released: 09/26/2007 Document Revised: 05/15/2016 Document Reviewed: 05/15/2016 Elsevier Interactive Patient Education  2019 [employer]. Otitis Media, Adult  Otitis media means that the middle ear is red and swollen (inflamed) and full of fluid. The condition usually goes away on its own. Follow these instructions at home:  Take over-the-counter and prescription medicines only as told by your doctor.  If you were prescribed an antibiotic medicine, take it as told by your doctor. Do not stop taking the antibiotic even if you start to feel better.  Keep all follow-up visits as told by your doctor. This is important. Contact a doctor if:  You have bleeding from your nose.  There is a lump on your neck.  You are not getting better in 5 days.  You feel worse instead of better. Get help right away if:  You have pain that is not helped with medicine.  You have swelling, redness, or pain around your ear.  You get a stiff neck.  You cannot move part of your face (paralyzed).  You notice that the bone behind your ear hurts when you touch it.  You get a very bad headache. Summary  Otitis media means that the middle ear is red, swollen, and full of fluid.  This condition usually goes away on its own. In some cases, treatment may be needed.  If you were prescribed an antibiotic medicine, take it as told by your doctor. This information is not intended to replace advice given to you by your health care provider. Make sure you discuss any questions you have with your health care provider. Document Released: 09/26/2007 Document Revised: 04/30/2016 Document Reviewed: 04/30/2016 Elsevier Interactive Patient Education  2019 [employer].

## 2018-06-16 NOTE — Progress Notes (Addendum)
[facility]  [facility]     Patient ID: [name] PhoenixKristi [name], female   DOB: 1971/04/23, 48 y.o.   MRN: [id number]  Blood pressure 138/78, pulse 65, temperature 99 F (37.2 C), temperature source Oral, resp. rate 16, last menstrual period 06/01/2018, SpO2 97 %.  Patient is a 48 year old female in no acute distress who comes to the clinic for complaints of sore throat.   LMP - currently.  Denies any chance of pregnancy, she reports this period is lasting 2 weeks and she has called her gynecologist today and is actually speaking to them regarding a plan as provider enters the room.  Olive Bassough, Robert L, MD primary care physician   Sore Throat   This is a new problem. The current episode started in the past 7 days. The problem has been gradually worsening. Neither side of throat is experiencing more pain than the other. The maximum temperature recorded prior to her arrival was 100.4 - 100.9 F. The fever has been present for less than 1 day. The pain is at a severity of 0/10. The patient is experiencing no pain. Associated symptoms include coughing (ocassional ), ear pain (left ) and headaches. Pertinent negatives include no abdominal pain, congestion, diarrhea, drooling, ear discharge, hoarse voice, plugged ear sensation, neck pain, shortness of breath, stridor, swollen glands, trouble swallowing or vomiting. She has had no exposure to strep or mono. Exposure to: sister posiblt strep recent [place]. She has tried oral narcotic analgesics for the symptoms. The treatment provided mild relief.   Exposures possible was [place] and eating buffets/ crowds over 2 weeks ago.  Allergies  Allergen Reactions  . Aspirin Swelling   Patient Active Problem List   Diagnosis Date Noted  . Acute left-sided back pain 09/19/2017  . Encounter for biometric screening 09/19/2017  . Gastroesophageal reflux disease without esophagitis 09/19/2017  . Cyst of right  ovary 07/03/2017  . DUB (dysfunctional uterine bleeding) 07/03/2017  . Vaginal mass 07/03/2017  . Osteoarthritis of knee 09/19/2016  . Sleep apnea   . Appendicitis, acute 08/16/2016  . Pain in joint, lower leg 01/12/2014  . Obesity   . Hypertension   . Atrial tachycardia, paroxysmal (HCC)   . LBBB (left bundle branch block)      Review of Systems  Constitutional: Negative for activity change, appetite change, chills, diaphoresis, fatigue, fever and unexpected weight change.  HENT: Positive for ear pain (left ) and sore throat. Negative for congestion, dental problem, drooling, ear discharge, facial swelling, hearing loss, hoarse voice, mouth sores, nosebleeds, postnasal drip, rhinorrhea, sinus pressure, sinus pain, sneezing, tinnitus, trouble swallowing and voice change.   Eyes: Negative for photophobia, pain, discharge, redness, itching and visual disturbance.  Respiratory: Positive for cough (ocassional ). Negative for apnea, choking, chest tightness, shortness of breath, wheezing and stridor.   Cardiovascular: Negative for chest pain, palpitations and leg swelling.  Gastrointestinal: Negative.  Negative for abdominal distention, abdominal pain, anal bleeding, blood in stool, constipation, diarrhea, nausea, rectal pain and vomiting.  Endocrine: Negative for polydipsia, polyphagia and polyuria.  Genitourinary: Positive for menstrual problem (seeing her gynecologist 06/16/18). Negative for decreased urine volume, difficulty urinating, dyspareunia, dysuria, enuresis, flank pain, frequency, genital sores, hematuria, pelvic pain, urgency, vaginal bleeding, vaginal discharge and vaginal pain.  Musculoskeletal: Negative.  Negative for neck pain.  Skin: Negative.  Negative for color change, pallor, rash and wound.  Allergic/Immunologic:        -- Aspirin -- Swelling  Neurological: Positive for headaches. Negative for dizziness, tremors, seizures, syncope, facial asymmetry, speech difficulty,  weakness, light-headedness and numbness.  Hematological: Negative.   Psychiatric/Behavioral: Negative.        Objective:   Physical Exam Vitals signs reviewed.  Constitutional:      General: She is not in acute distress.    Appearance: She is well-developed. She is obese. She is not ill-appearing, toxic-appearing or diaphoretic.     Comments: Patient is alert and oriented and responsive to questions Engages in eye contact with provider. Speaks in full sentences without any pauses without any shortness of breath or distress.    HENT:     Head: Normocephalic and atraumatic.     Right Ear: Tympanic membrane normal. No drainage, swelling or tenderness. No middle ear effusion. Tympanic membrane is not erythematous.     Left Ear: Ear canal normal. No drainage, swelling or tenderness. A middle ear effusion is present. Tympanic membrane is erythematous.     Nose: No congestion or rhinorrhea.     Mouth/Throat:     Mouth: Mucous membranes are moist. No oral lesions.     Pharynx: Uvula midline. Oropharyngeal exudate and posterior oropharyngeal erythema present. No pharyngeal swelling or uvula swelling.     Tonsils: Swelling: 0 on the right. 0 on the left.  Eyes:     Extraocular Movements:     Right eye: Normal extraocular motion.     Left eye: Normal extraocular motion.     Conjunctiva/sclera: Conjunctivae normal.     Pupils: Pupils are equal, round, and reactive to light.  Neck:     Musculoskeletal: Normal range of motion and neck supple.     Thyroid: No thyromegaly.  Cardiovascular:     Rate and Rhythm: Normal rate and regular rhythm.     Heart sounds: Normal heart sounds. No murmur. No friction rub. No gallop.   Pulmonary:     Effort: Pulmonary effort is normal. No respiratory distress.     Breath sounds: No stridor. No wheezing, rhonchi or rales.  Chest:     Chest wall: No tenderness.  Abdominal:     Palpations: Abdomen is soft.  Genitourinary:    Comments: No gynecology exams  done in this office currently and no equipment available. Patient is aware she will have to see gynecology if needed for any pelvic/vaginal exam and will follow up with gynecology/obgyn as needed. Yearly gynecology pelvic exam recommended. Patient verbalized understanding of instructions and denies any further questions at this time.   Lymphadenopathy:     Cervical: No cervical adenopathy.  Skin:    General: Skin is warm and dry.     Capillary Refill: Capillary refill takes less than 2 seconds.  Neurological:     General: No focal deficit present.     Mental Status: She is alert and oriented to person, place, and time.     Comments: Patient moves on and off of exam table and in room without difficulty. Gait is normal in hall and in room. Patient is oriented to person place time and situation. Patient answers questions appropriately and engages in conversation.   Psychiatric:        Mood and Affect: Mood normal.        Behavior: Behavior normal.        Assessment:    Acute streptococcal pharyngitis  Sore throat - Plan: POCT Rapid Strep A-Office (CPT [id number]), CANCELED: Strep A DNA probe   Results for orders placed or performed in visit  on 06/16/18 (from the past 24 hour(s))  POCT Rapid Strep A-Office (CPT [phone])     Status: Abnormal   Collection Time: 06/16/18 11:45 AM  Result Value Ref Range   Rapid Strep A Screen Positive (A) Negative       Plan:     Meds ordered this encounter  Medications  . amoxicillin (AMOXIL) 875 MG tablet    Sig: Take 1 tablet (875 mg total) by mouth 2 (two) times daily.    Dispense:  20 tablet    Refill:  0     Kameryn was seen today for sinusitis, sore throat and cough.  Diagnoses and all orders for this visit:  Acute streptococcal pharyngitis  Sore throat -     POCT Rapid Strep A-Office (CPT [phone]) -     Cancel: Strep A DNA probe  Other orders -     amoxicillin (AMOXIL) 875 MG tablet; Take 1 tablet (875 mg total) by mouth 2 (two) times  daily.    Advised patient call the office or your primary care doctor for an appointment if no improvement within 72 hours or if any symptoms change or worsen at any time  Advised ER or urgent Care if after hours or on weekend. Call 911 for emergency symptoms at any time.Patinet verbalized understanding of all instructions given/reviewed and treatment plan and has no further questions or concerns at this time.    Patient verbalized understanding of all instructions given and denies any further questions at this time.

## 2018-06-19 ENCOUNTER — Telehealth: Payer: Self-pay | Admitting: Adult Health

## 2018-06-19 ENCOUNTER — Ambulatory Visit
Admission: RE | Admit: 2018-06-19 | Discharge: 2018-06-19 | Disposition: A | Discharge status: Home / Self Care | Payer: Managed Care, Other (non HMO) | Source: Ambulatory Visit | Attending: Adult Health | Admitting: Adult Health

## 2018-06-19 ENCOUNTER — Other Ambulatory Visit: Payer: Self-pay

## 2018-06-19 ENCOUNTER — Ambulatory Visit: Payer: Self-pay | Admitting: Adult Health

## 2018-06-19 ENCOUNTER — Ambulatory Visit
Admission: RE | Admit: 2018-06-19 | Discharge: 2018-06-19 | Disposition: A | Discharge status: Home / Self Care | Payer: Managed Care, Other (non HMO) | Attending: Adult Health | Admitting: Adult Health

## 2018-06-19 VITALS — BP 142/84 | HR 72 | Temp 98.5°F | Resp 16

## 2018-06-19 DIAGNOSIS — J0100 Acute maxillary sinusitis, unspecified: Secondary | ICD-10-CM

## 2018-06-19 DIAGNOSIS — R05 Cough: Secondary | ICD-10-CM | POA: Insufficient documentation

## 2018-06-19 DIAGNOSIS — R059 Cough, unspecified: Secondary | ICD-10-CM

## 2018-06-19 DIAGNOSIS — R062 Wheezing: Secondary | ICD-10-CM

## 2018-06-19 DIAGNOSIS — J020 Streptococcal pharyngitis: Secondary | ICD-10-CM

## 2018-06-19 MED ORDER — ALBUTEROL SULFATE HFA 108 (90 BASE) MCG/ACT IN AERS: 1.0000 | INHALATION_SPRAY | Freq: Four times a day (QID) | RESPIRATORY_TRACT | 0 refills | Status: DC | PRN

## 2018-06-19 MED ORDER — PREDNISONE 10 MG (21) PO TBPK: ORAL_TABLET | ORAL | 0 refills | Status: DC

## 2018-06-19 MED ORDER — AMOXICILLIN-POT CLAVULANATE 875-125 MG PO TABS: 1.0000 | ORAL_TABLET | Freq: Two times a day (BID) | ORAL | 0 refills | Status: DC

## 2018-06-19 NOTE — Progress Notes (Signed)
[facility]  Subjective:     Patient ID: [name] PhoenixKristi [name], female   DOB: 1971/01/13, 48 y.o.   MRN: [id number]  Blood pressure (!) 142/84, pulse 72, temperature 98.5 F (36.9 C), temperature source Oral, resp. rate 16, last menstrual period 06/01/2018, SpO2 98 %.  Cough  This is a new problem. The current episode started 1 to 4 weeks ago. The problem has been gradually worsening. The problem occurs every few minutes. The cough is productive of sputum (yellow at times. ). Associated symptoms include a fever, headaches (mild frontal " feels like sinus drainage" ), rhinorrhea, a sore throat and wheezing. Pertinent negatives include no chest pain, chills, ear congestion, ear pain, heartburn, hemoptysis, myalgias, nasal congestion, postnasal drip, rash, shortness of breath, sweats or weight loss.     Patient is a 48 year old female in no acute distress the clinic for complaint of cough.   She had a history of a sore throat for 7 days when seen on 06/16/2018.   She had a positive strep pharyngitis on 06/16/2018 she was given amoxicillin 875 mg twice daily x10 days.  She reports that her sore throat is improved since starting Amoxicillin. She continues to have chest congestion, nasal congestion, facial pressure and post nasal drip, generalized teeth pain bilaterally.  She does feel that she had a low-grade fever last night.  She reports she took a cough medication that helped her last night - she is unsure of the name.  Blood pressure is elevated at visit, she has a history of atrial tachycardia, she avoids decongestants, she reports tachycardia is under control with medication. She is currently taking Provera x 2 days for her heavy menstrual cycle- prescribed by her OBGYN.   She follows up regularly with her primary care provider.  She denies any edema, leg pain, or recent travel. She is morbidly obese. Patient  denies any fever, body aches,chills, rash,  chest pain, shortness of breath, nausea, vomiting, or diarrhea.   Review of Systems  Constitutional: Positive for fatigue and fever. Negative for activity change, appetite change, chills, diaphoresis, unexpected weight change and weight loss.  HENT: Positive for congestion, dental problem (generalized upper and lower. denies jaw pain or neck pain. ), rhinorrhea, sinus pressure and sore throat. Negative for drooling, ear discharge, ear pain, facial swelling, hearing loss, mouth sores, nosebleeds, postnasal drip, sinus pain, sneezing, tinnitus, trouble swallowing and voice change.   Eyes: Negative.   Respiratory: Positive for cough and wheezing. Negative for apnea, hemoptysis, choking, chest tightness, shortness of breath and stridor.   Cardiovascular: Negative.  Negative for chest pain, palpitations and leg swelling.  Gastrointestinal: Negative.  Negative for heartburn.  Endocrine: Negative for polydipsia, polyphagia and polyuria.  Genitourinary: Positive for menstrual problem (under care of OBGYN - taking provera last two days hads a 10 day course. ). Negative for decreased urine volume, difficulty urinating, dyspareunia, dysuria, enuresis, flank pain, frequency, genital sores, hematuria, pelvic pain, urgency, vaginal bleeding, vaginal discharge and vaginal pain.  Musculoskeletal: Negative.  Negative for arthralgias, back pain, gait problem, joint swelling, myalgias, neck pain and neck stiffness.  Skin: Negative.  Negative for rash.  Neurological: Positive for headaches (mild frontal " feels like sinus drainage" ). Negative for dizziness, tremors, seizures, syncope, facial asymmetry, speech difficulty, weakness, light-headedness and numbness.  Hematological: Negative.   Psychiatric/Behavioral: Negative.       Exposures possible was [place] and eating buffets/ crowds over 2 weeks ago.  Objective:  Physical Exam Vitals signs reviewed.  Constitutional:      General: She is not in  acute distress.    Appearance: Normal appearance. She is well-groomed. She is morbidly obese. She is not ill-appearing, toxic-appearing or diaphoretic.     Comments: Patient is alert and oriented and responsive to questions Engages in eye contact with provider. Speaks in full sentences without any pauses without any shortness of breath or distress.    HENT:     Head: Normocephalic and atraumatic.     Salivary Glands: Right salivary gland is not diffusely enlarged or tender. Left salivary gland is not diffusely enlarged or tender.     Right Ear: Hearing and ear canal normal. A middle ear effusion is present. There is no impacted cerumen. No mastoid tenderness. Tympanic membrane is erythematous. Tympanic membrane is not perforated, retracted or bulging.     Left Ear: Hearing, ear canal and external ear normal. A middle ear effusion is present. There is no impacted cerumen. No mastoid tenderness. Tympanic membrane is erythematous. Tympanic membrane is not perforated, retracted or bulging.     Nose: Mucosal edema and rhinorrhea (yellow ) present. Rhinorrhea is purulent.     Right Sinus: Maxillary sinus tenderness present. No frontal sinus tenderness.     Left Sinus: Maxillary sinus tenderness present.     Mouth/Throat:     Lips: Pink.     Pharynx: Oropharynx is clear. Uvula midline. No posterior oropharyngeal erythema or uvula swelling.     Tonsils: No tonsillar exudate or tonsillar abscesses. Swelling: 0 on the right. 0 on the left.  Eyes:     General: Lids are normal.     Conjunctiva/sclera: Conjunctivae normal.  Neck:     Musculoskeletal: Full passive range of motion without pain, normal range of motion and neck supple. No neck rigidity or muscular tenderness.     Trachea: Trachea and phonation normal.  Cardiovascular:     Rate and Rhythm: Normal rate and regular rhythm.  Pulmonary:     Effort: Pulmonary effort is normal.     Breath sounds: Normal air entry. No stridor, decreased air  movement or transmitted upper airway sounds. Examination of the right-upper field reveals wheezing. Examination of the left-upper field reveals wheezing. Wheezing present. No decreased breath sounds, rhonchi or rales.     Comments: Mild expiratory wheeze bilateral upper lobes. Chest:     Chest wall: No tenderness.  Lymphadenopathy:     Cervical: No cervical adenopathy.     Right cervical: No superficial, deep or posterior cervical adenopathy.    Left cervical: No superficial, deep or posterior cervical adenopathy.  Neurological:     Mental Status: She is alert.     GCS: GCS eye subscore is 4. GCS verbal subscore is 5. GCS motor subscore is 6.     Cranial Nerves: Cranial nerves are intact.     Sensory: Sensation is intact.     Motor: Motor function is intact.     Coordination: Coordination is intact.     Gait: Gait is intact.     Comments: Patient moves on and off of exam table and in room without difficulty. Gait is normal in hall and in room. Patient is oriented to person place time and situation. Patient answers questions appropriately and engages in conversation.   Psychiatric:        Attention and Perception: Attention normal.        Mood and Affect: Mood normal.  Speech: Speech normal.        Behavior: Behavior normal. Behavior is cooperative.        Thought Content: Thought content normal.        Cognition and Memory: Cognition normal.        Judgment: Judgment normal.        Assessment:     Acute non-recurrent maxillary sinusitis  Cough - Plan: DG Chest 2 View  Wheezing  Acute streptococcal pharyngitis- resolving       Plan:     Lorenda was seen today for cough.  Diagnoses and all orders for this visit:  Acute non-recurrent maxillary sinusitis  Cough -     DG Chest 2 View; Future  Wheezing  Acute streptococcal pharyngitis- resolving   Other orders -     predniSONE (STERAPRED UNI-PAK 21 TAB) 10 MG (21) TBPK tablet; PO: Take 6 tablets on day 1:Take  5 tablets day 2:Take 4 tablets day 3: Take 3 tablets day 4:Take 2 tablets day five: 5 Take 1 tablet day 6 -     albuterol (PROVENTIL HFA;VENTOLIN HFA) 108 (90 Base) MCG/ACT inhaler; Inhale 1 puff into the lungs every 6 (six) hours as needed for wheezing or shortness of breath. -     amoxicillin-clavulanate (AUGMENTIN) 875-125 MG tablet; Take 1 tablet by mouth 2 (two) times daily.   Medications Discontinued During This Encounter  Medication Reason  . amoxicillin (AMOXIL) 875 MG tablet Completed Course      Will treat with Augmentin for sinusitis and still cover strep A positive given sinus pressure.  Patient is aware that amoxicillin will be discontinued.  Discussed risks versus benefits with Albuterol and Coreg. She will only use if needed. She reports she has had albuterol inhaler in the past without any issues.  Advised of overuse use and interactions that can occur.  She is aware that her blood pressure is elevated today during this visit, possible causes could be cold medication she was taking last night, Provera her OB/GYN prescribed, she should call the OB/GYN and follow-up given her blood pressure reading today in the office and as well follow-up with her primary care provider for blood pressure check. Red flag symptoms discussed and patient will proceed to the nearest emergency room or call 911 if any occur  Will call with chest x ray results.   She is instructed to follow-up with her primary care provider within 1 to 3 days for her blood pressure, and she will monitor at home keeping a log.  Advised patient call the office or your primary care doctor for an appointment if no improvement within 72 hours or if any symptoms change or worsen at any time  Advised ER or urgent Care if after hours or on weekend. Call 911 for emergency symptoms at any time.Patinet verbalized understanding of all instructions given/reviewed and treatment plan and has no further questions or concerns at this  time.    Patient verbalized understanding of all instructions given and denies any further questions at this time.

## 2018-06-19 NOTE — Telephone Encounter (Signed)
1:14 PM 06/19/2018 patient was called with chest x-ray results which were normal.  Patient will follow-up with her OB/GYN regarding Provera and her high blood pressure.  Patient will also monitor her blood pressure and report any new symptoms to her primary care provider.  Patient has no questions at this time and will follow office visit plan as discussed and seek emergency care for any red flag symptoms as discussed at office visit. CLINICAL DATA:  Cough, shortness of breath.  EXAM: CHEST - 2 VIEW  COMPARISON:  Radiograph of May 30, 2010.  FINDINGS: The heart size and mediastinal contours are within normal limits. Both lungs are clear. No pneumothorax or pleural effusion is noted. The visualized skeletal structures are unremarkable.  IMPRESSION: No active cardiopulmonary disease.   Electronically Signed   By: Lupita Raider, M.D.   On: 06/19/2018 11:54

## 2018-06-19 NOTE — Patient Instructions (Signed)
Sinusitis, Adult Sinusitis is soreness and swelling (inflammation) of your sinuses. Sinuses are hollow spaces in the bones around your face. They are located:  Around your eyes.  In the middle of your forehead.  Behind your nose.  In your cheekbones. Your sinuses and nasal passages are lined with a fluid called mucus. Mucus drains out of your sinuses. Swelling can trap mucus in your sinuses. This lets germs (bacteria, virus, or fungus) grow, which leads to infection. Most of the time, this condition is caused by a virus. What are the causes? This condition is caused by:  Allergies.  Asthma.  Germs.  Things that block your nose or sinuses.  Growths in the nose (nasal polyps).  Chemicals or irritants in the air.  Fungus (rare). What increases the risk? You are more likely to develop this condition if:  You have a weak body defense system (immune system).  You do a lot of swimming or diving.  You use nasal sprays too much.  You smoke. What are the signs or symptoms? The main symptoms of this condition are pain and a feeling of pressure around the sinuses. Other symptoms include:  Stuffy nose (congestion).  Runny nose (drainage).  Swelling and warmth in the sinuses.  Headache.  Toothache.  A cough that may get worse at night.  Mucus that collects in the throat or the back of the nose (postnasal drip).  Being unable to smell and taste.  Being very tired (fatigue).  A fever.  Sore throat.  Bad breath. How is this diagnosed? This condition is diagnosed based on:  Your symptoms.  Your medical history.  A physical exam.  Tests to find out if your condition is short-term (acute) or long-term (chronic). Your doctor may: ? Check your nose for growths (polyps). ? Check your sinuses using a tool that has a light (endoscope). ? Check for allergies or germs. ? Do imaging tests, such as an MRI or CT scan. How is this treated? Treatment for this condition  depends on the cause and whether it is short-term or long-term.  If caused by a virus, your symptoms should go away on their own within 10 days. You may be given medicines to relieve symptoms. They include: ? Medicines that shrink swollen tissue in the nose. ? Medicines that treat allergies (antihistamines). ? A spray that treats swelling of the nostrils. ? Rinses that help get rid of thick mucus in your nose (nasal saline washes).  If caused by bacteria, your doctor may wait to see if you will get better without treatment. You may be given antibiotic medicine if you have: ? A very bad infection. ? A weak body defense system.  If caused by growths in the nose, you may need to have surgery. Follow these instructions at home: Medicines  Take, use, or apply over-the-counter and prescription medicines only as told by your doctor. These may include nasal sprays.  If you were prescribed an antibiotic medicine, take it as told by your doctor. Do not stop taking the antibiotic even if you start to feel better. Hydrate and humidify   Drink enough water to keep your pee (urine) pale yellow.  Use a cool mist humidifier to keep the humidity level in your home above 50%.  Breathe in steam for 10-15 minutes, 3-4 times a day, or as told by your doctor. You can do this in the bathroom while a hot shower is running.  Try not to spend time in cool or dry air.   Rest  Rest as much as you can.  Sleep with your head raised (elevated).  Make sure you get enough sleep each night. General instructions   Put a warm, moist washcloth on your face 3-4 times a day, or as often as told by your doctor. This will help with discomfort.  Wash your hands often with soap and water. If there is no soap and water, use hand sanitizer.  Do not smoke. Avoid being around people who are smoking (secondhand smoke).  Keep all follow-up visits as told by your doctor. This is important. Contact a doctor if:  You  have a fever.  Your symptoms get worse.  Your symptoms do not get better within 10 days. Get help right away if:  You have a very bad headache.  You cannot stop throwing up (vomiting).  You have very bad pain or swelling around your face or eyes.  You have trouble seeing.  You feel confused.  Your neck is stiff.  You have trouble breathing. Summary  Sinusitis is swelling of your sinuses. Sinuses are hollow spaces in the bones around your face.  This condition is caused by tissues in your nose that become inflamed or swollen. This traps germs. These can lead to infection.  If you were prescribed an antibiotic medicine, take it as told by your doctor. Do not stop taking it even if you start to feel better.  Keep all follow-up visits as told by your doctor. This is important. This information is not intended to replace advice given to you by your health care provider. Make sure you discuss any questions you have with your health care provider. Document Released: 09/26/2007 Document Revised: 09/09/2017 Document Reviewed: 09/09/2017 Elsevier Interactive Patient Education  2019 [employer] Prednisolone tablets What is this medicine? PREDNISOLONE (pred NISS oh lone) is a corticosteroid. It is commonly used to treat inflammation of the skin, joints, lungs, and other organs. Common conditions treated include asthma, allergies, and arthritis. It is also used for other conditions, such as blood disorders and diseases of the adrenal glands. This medicine may be used for other purposes; ask your health care provider or pharmacist if you have questions. COMMON BRAND NAME(S): Millipred, Millipred DP, Millipred DP 12-Day, Millipred DP 6 Day, Prednoral What should I tell my health care provider before I take this medicine? They need to know if you have any of these conditions: -Cushing's syndrome -diabetes -glaucoma -heart problems or disease -high blood pressure -infection such as  herpes, measles, tuberculosis, or chickenpox -kidney disease -liver disease -mental problems -myasthenia gravis -osteoporosis -seizures -stomach ulcer or intestine disease including colitis and diverticulitis -thyroid problem -an unusual or allergic reaction to lactose, prednisolone, other medicines, foods, dyes, or preservatives -pregnant or trying to get pregnant -breast-feeding How should I use this medicine? Take this medicine by mouth with a glass of water. Follow the directions on the prescription label. Take it with food or milk to avoid stomach upset. If you are taking this medicine once a day, take it in the morning. Do not take more medicine than you are told to take. Do not suddenly stop taking your medicine because you may develop a severe reaction. Your doctor will tell you how much medicine to take. If your doctor wants you to stop the medicine, the dose may be slowly lowered over time to avoid any side effects. Talk to your pediatrician regarding the use of this medicine in children. Special care may be needed. Overdosage: If you think you  have taken too much of this medicine contact a poison control center or emergency room at once. NOTE: This medicine is only for you. Do not share this medicine with others. What if I miss a dose? If you miss a dose, take it as soon as you can. If it is almost time for your next dose, take only that dose. Do not take double or extra doses. What may interact with this medicine? Do not take this medicine with any of the following medications: -metyrapone -mifepristone This medicine may also interact with the following medications: -aminoglutethimide -amphotericin B -aspirin and aspirin-like medicines -barbiturates -certain medicines for diabetes, like glipizide or glyburide -cholestyramine -cholinesterase inhibitors -cyclosporine -digoxin -diuretics -ephedrine -female hormones, like estrogens and birth control  pills -isoniazid -ketoconazole -NSAIDS, medicines for pain and inflammation, like ibuprofen or naproxen -phenytoin -rifampin -toxoids -vaccines -warfarin This list may not describe all possible interactions. Give your health care provider a list of all the medicines, herbs, non-prescription drugs, or dietary supplements you use. Also tell them if you smoke, drink alcohol, or use illegal drugs. Some items may interact with your medicine. What should I watch for while using this medicine? Visit your doctor or health care professional for regular checks on your progress. If you are taking this medicine over a prolonged period, carry an identification card with your name and address, the type and dose of your medicine, and your doctor's name and address. This medicine may increase your risk of getting an infection. Tell your doctor or health care professional if you are around anyone with measles or chickenpox, or if you develop sores or blisters that do not heal properly. If you are going to have surgery, tell your doctor or health care professional that you have taken this medicine within the last twelve months. Ask your doctor or health care professional about your diet. You may need to lower the amount of salt you eat. This medicine may affect blood sugar levels. If you have diabetes, check with your doctor or health care professional before you change your diet or the dose of your diabetic medicine. What side effects may I notice from receiving this medicine? Side effects that you should report to your doctor or health care professional as soon as possible: -allergic reactions like skin rash, itching or hives, swelling of the face, lips, or tongue -changes in emotions or moods -changes in vision -eye pain -signs and symptoms of high blood sugar such as dizziness; dry mouth; dry skin; fruity breath; nausea; stomach pain; increased hunger or thirst; increased urination -signs and symptoms of  infection like fever or chills; cough; sore throat; pain or trouble passing urine -slow growth in children (if used for longer periods of time) -swelling of ankles, feet -trouble sleeping -unusually weak or tired -weak bones (if used for longer periods of time) Side effects that usually do not require medical attention (report to your doctor or health care professional if they continue or are bothersome): -increased hunger -nausea -skin problems, acne, thin and shiny skin -upset stomach -weight gain This list may not describe all possible side effects. Call your doctor for medical advice about side effects. You may report side effects to FDA at 1-800-FDA-1088. Where should I keep my medicine? Keep out of the reach of children. Store at room temperature between 15 and 30 degrees C (59 and 86 degrees F). Keep container tightly closed. Throw away any unused medicine after the expiration date. NOTE: This sheet is a summary. It may not  cover all possible information. If you have questions about this medicine, talk to your doctor, pharmacist, or health care provider.  2019 Elsevier/Gold Standard (2015-05-12 12:30:30) Albuterol inhalation aerosol What is this medicine? ALBUTEROL (al Gaspar Bidding) is a bronchodilator. It helps open up the airways in your lungs to make it easier to breathe. This medicine is used to treat and to prevent bronchospasm. This medicine may be used for other purposes; ask your health care provider or pharmacist if you have questions. COMMON BRAND NAME(S): Proair HFA, Proventil, Proventil HFA, Respirol, Ventolin, Ventolin HFA What should I tell my health care provider before I take this medicine? They need to know if you have any of the following conditions: -diabetes -heart disease or irregular heartbeat -high blood pressure -pheochromocytoma -seizures -thyroid disease -an unusual or allergic reaction to albuterol, levalbuterol, sulfites, other medicines, foods,  dyes, or preservatives -pregnant or trying to get pregnant -breast-feeding How should I use this medicine? This medicine is for inhalation through the mouth. Follow the directions on your prescription label. Take your medicine at regular intervals. Do not use more often than directed. Make sure that you are using your inhaler correctly. Ask you doctor or health care provider if you have any questions. Talk to your pediatrician regarding the use of this medicine in children. Special care may be needed. Overdosage: If you think you have taken too much of this medicine contact a poison control center or emergency room at once. NOTE: This medicine is only for you. Do not share this medicine with others. What if I miss a dose? If you miss a dose, use it as soon as you can. If it is almost time for your next dose, use only that dose. Do not use double or extra doses. What may interact with this medicine? -anti-infectives like chloroquine and pentamidine -caffeine -cisapride -diuretics -medicines for colds -medicines for depression or for emotional or psychotic conditions -medicines for weight loss including some herbal products -methadone -some antibiotics like clarithromycin, erythromycin, levofloxacin, and linezolid -some heart medicines -steroid hormones like dexamethasone, cortisone, hydrocortisone -theophylline -thyroid hormones This list may not describe all possible interactions. Give your health care provider a list of all the medicines, herbs, non-prescription drugs, or dietary supplements you use. Also tell them if you smoke, drink alcohol, or use illegal drugs. Some items may interact with your medicine. What should I watch for while using this medicine? Tell your doctor or health care professional if your symptoms do not improve. Do not use extra albuterol. If your asthma or bronchitis gets worse while you are using this medicine, call your doctor right away. If your mouth gets dry  try chewing sugarless gum or sucking hard candy. Drink water as directed. What side effects may I notice from receiving this medicine? Side effects that you should report to your doctor or health care professional as soon as possible: -allergic reactions like skin rash, itching or hives, swelling of the face, lips, or tongue -breathing problems -chest pain -feeling faint or lightheaded, falls -high blood pressure -irregular heartbeat -fever -muscle cramps or weakness -pain, tingling, numbness in the hands or feet -vomiting Side effects that usually do not require medical attention (report to your doctor or health care professional if they continue or are bothersome): -cough -difficulty sleeping -headache -nervousness or trembling -stomach upset -stuffy or runny nose -throat irritation -unusual taste This list may not describe all possible side effects. Call your doctor for medical advice about side effects. You may report side  effects to FDA at 1-800-FDA-1088. Where should I keep my medicine? Keep out of the reach of children. Store at room temperature between 15 and 30 degrees C (59 and 86 degrees F). The contents are under pressure and may burst when exposed to heat or flame. Do not freeze. This medicine does not work as well if it is too cold. Throw away any unused medicine after the expiration date. Inhalers need to be thrown away after the labeled number of puffs have been used or by the expiration date; whichever comes first. Ventolin HFA should be thrown away 12 months after removing from foil pouch. Check the instructions that come with your medicine. NOTE: This sheet is a summary. It may not cover all possible information. If you have questions about this medicine, talk to your doctor, pharmacist, or health care provider.  2019 Elsevier/Gold Standard (2012-09-25 10:57:17) Amoxicillin; Clavulanic Acid tablets What is this medicine? AMOXICILLIN; CLAVULANIC ACID (a mox i SIL in;  KLAV yoo lan ic AS id) is a penicillin antibiotic. It is used to treat certain kinds of bacterial infections. It will not work for colds, flu, or other viral infections. This medicine may be used for other purposes; ask your health care provider or pharmacist if you have questions. COMMON BRAND NAME(S): Augmentin What should I tell my health care provider before I take this medicine? They need to know if you have any of these conditions: -bowel disease, like colitis -kidney disease -liver disease -mononucleosis -an unusual or allergic reaction to amoxicillin, penicillin, cephalosporin, other antibiotics, clavulanic acid, other medicines, foods, dyes, or preservatives -pregnant or trying to get pregnant -breast-feeding How should I use this medicine? Take this medicine by mouth with a full glass of water. Follow the directions on the prescription label. Take at the start of a meal. Do not crush or chew. If the tablet has a score line, you may cut it in half at the score line for easier swallowing. Take your medicine at regular intervals. Do not take your medicine more often than directed. Take all of your medicine as directed even if you think you are better. Do not skip doses or stop your medicine early. Talk to your pediatrician regarding the use of this medicine in children. Special care may be needed. Overdosage: If you think you have taken too much of this medicine contact a poison control center or emergency room at once. NOTE: This medicine is only for you. Do not share this medicine with others. What if I miss a dose? If you miss a dose, take it as soon as you can. If it is almost time for your next dose, take only that dose. Do not take double or extra doses. What may interact with this medicine? -allopurinol -anticoagulants -birth control pills -methotrexate -probenecid This list may not describe all possible interactions. Give your health care provider a list of all the medicines,  herbs, non-prescription drugs, or dietary supplements you use. Also tell them if you smoke, drink alcohol, or use illegal drugs. Some items may interact with your medicine. What should I watch for while using this medicine? Tell your doctor or health care professional if your symptoms do not improve. Do not treat diarrhea with over the counter products. Contact your doctor if you have diarrhea that lasts more than 2 days or if it is severe and watery. If you have diabetes, you may get a false-positive result for sugar in your urine. Check with your doctor or health care professional. Birth  control pills may not work properly while you are taking this medicine. Talk to your doctor about using an extra method of birth control. What side effects may I notice from receiving this medicine? Side effects that you should report to your doctor or health care professional as soon as possible: -allergic reactions like skin rash, itching or hives, swelling of the face, lips, or tongue -breathing problems -dark urine -fever or chills, sore throat -redness, blistering, peeling or loosening of the skin, including inside the mouth -seizures -trouble passing urine or change in the amount of urine -unusual bleeding, bruising -unusually weak or tired -white patches or sores in the mouth or throat Side effects that usually do not require medical attention (report to your doctor or health care professional if they continue or are bothersome): -diarrhea -dizziness -headache -nausea, vomiting -stomach upset -vaginal or anal irritation This list may not describe all possible side effects. Call your doctor for medical advice about side effects. You may report side effects to FDA at 1-800-FDA-1088. Where should I keep my medicine? Keep out of the reach of children. Store at room temperature below 25 degrees C (77 degrees F). Keep container tightly closed. Throw away any unused medicine after the expiration  date. NOTE: This sheet is a summary. It may not cover all possible information. If you have questions about this medicine, talk to your doctor, pharmacist, or health care provider.  2019 Elsevier/Gold Standard (2007-07-03 12:04:30)

## 2018-06-19 NOTE — Progress Notes (Signed)
Normal chest xray patient was notified by phone 06/19/18 1:14pm  Patient verbalized understanding of all instructions given and denies any further questions at this time.

## 2018-09-12 DIAGNOSIS — Z6841 Body Mass Index (BMI) 40.0 and over, adult: Secondary | ICD-10-CM | POA: Insufficient documentation

## 2018-09-17 ENCOUNTER — Other Ambulatory Visit (HOSPITAL_COMMUNITY): Payer: Self-pay | Admitting: Orthopedic Surgery

## 2018-09-17 ENCOUNTER — Other Ambulatory Visit: Payer: Self-pay | Admitting: Orthopedic Surgery

## 2018-09-17 DIAGNOSIS — M1712 Unilateral primary osteoarthritis, left knee: Secondary | ICD-10-CM

## 2018-09-17 DIAGNOSIS — G8929 Other chronic pain: Secondary | ICD-10-CM

## 2018-09-29 ENCOUNTER — Other Ambulatory Visit: Payer: Self-pay

## 2018-09-29 ENCOUNTER — Ambulatory Visit
Admission: RE | Admit: 2018-09-29 | Discharge: 2018-09-29 | Disposition: A | Discharge status: Home / Self Care | Payer: Managed Care, Other (non HMO) | Source: Ambulatory Visit | Attending: Orthopedic Surgery | Admitting: Orthopedic Surgery

## 2018-09-29 DIAGNOSIS — M1712 Unilateral primary osteoarthritis, left knee: Secondary | ICD-10-CM | POA: Diagnosis present

## 2018-09-29 DIAGNOSIS — M25561 Pain in right knee: Secondary | ICD-10-CM | POA: Diagnosis present

## 2018-09-29 DIAGNOSIS — G8929 Other chronic pain: Secondary | ICD-10-CM | POA: Insufficient documentation

## 2018-10-30 ENCOUNTER — Encounter
Admission: RE | Admit: 2018-10-30 | Discharge: 2018-10-30 | Disposition: A | Discharge status: Home / Self Care | Payer: Managed Care, Other (non HMO) | Source: Ambulatory Visit | Attending: Orthopedic Surgery | Admitting: Orthopedic Surgery

## 2018-10-30 ENCOUNTER — Other Ambulatory Visit: Payer: Self-pay

## 2018-10-30 DIAGNOSIS — Z01818 Encounter for other preprocedural examination: Secondary | ICD-10-CM | POA: Insufficient documentation

## 2018-10-30 DIAGNOSIS — E669 Obesity, unspecified: Secondary | ICD-10-CM | POA: Insufficient documentation

## 2018-10-30 DIAGNOSIS — I447 Left bundle-branch block, unspecified: Secondary | ICD-10-CM | POA: Insufficient documentation

## 2018-10-30 DIAGNOSIS — R9431 Abnormal electrocardiogram [ECG] [EKG]: Secondary | ICD-10-CM | POA: Insufficient documentation

## 2018-10-30 DIAGNOSIS — G4730 Sleep apnea, unspecified: Secondary | ICD-10-CM | POA: Insufficient documentation

## 2018-10-30 HISTORY — DX: Gastro-esophageal reflux disease without esophagitis: K21.9

## 2018-10-30 HISTORY — DX: Unspecified osteoarthritis, unspecified site: M19.90

## 2018-10-30 LAB — URINALYSIS, ROUTINE W REFLEX MICROSCOPIC
Bilirubin Urine: NEGATIVE
Glucose, UA: NEGATIVE mg/dL
Hgb urine dipstick: NEGATIVE
Ketones, ur: NEGATIVE mg/dL
Leukocytes,Ua: NEGATIVE
Nitrite: NEGATIVE
Protein, ur: NEGATIVE mg/dL
Specific Gravity, Urine: 1.016 (ref 1.005–1.030)
pH: 6 (ref 5.0–8.0)

## 2018-10-30 LAB — CBC
HCT: 37.3 % (ref 36.0–46.0)
Hemoglobin: 11.5 g/dL — ABNORMAL LOW (ref 12.0–15.0)
MCH: 25.3 pg — ABNORMAL LOW (ref 26.0–34.0)
MCHC: 30.8 g/dL (ref 30.0–36.0)
MCV: 82 fL (ref 80.0–100.0)
Platelets: 278 10*3/uL (ref 150–400)
RBC: 4.55 MIL/uL (ref 3.87–5.11)
RDW: 15.1 % (ref 11.5–15.5)
WBC: 7.3 10*3/uL (ref 4.0–10.5)
nRBC: 0 % (ref 0.0–0.2)

## 2018-10-30 LAB — PROTIME-INR
INR: 1.1 (ref 0.8–1.2)
Prothrombin Time: 13.8 seconds (ref 11.4–15.2)

## 2018-10-30 LAB — SEDIMENTATION RATE: Sed Rate: 37 mm/hr — ABNORMAL HIGH (ref 0–30)

## 2018-10-30 LAB — APTT: aPTT: 37 seconds — ABNORMAL HIGH (ref 24–36)

## 2018-10-30 LAB — BASIC METABOLIC PANEL
Anion gap: 8 (ref 5–15)
BUN: 13 mg/dL (ref 6–20)
CO2: 26 mmol/L (ref 22–32)
Calcium: 8.7 mg/dL — ABNORMAL LOW (ref 8.9–10.3)
Chloride: 103 mmol/L (ref 98–111)
Creatinine, Ser: 0.55 mg/dL (ref 0.44–1.00)
GFR calc Af Amer: >60 mL/min (ref >60)
GFR calc non Af Amer: >60 mL/min (ref >60)
Glucose, Bld: 94 mg/dL (ref 70–99)
Potassium: 3.9 mmol/L (ref 3.5–5.1)
Sodium: 137 mmol/L (ref 135–145)

## 2018-10-30 LAB — TYPE AND SCREEN
ABO/RH(D): O POS
Antibody Screen: NEGATIVE

## 2018-10-30 LAB — SURGICAL PCR SCREEN
MRSA, PCR: NEGATIVE
Staphylococcus aureus: NEGATIVE

## 2018-10-30 NOTE — Patient Instructions (Signed)
Your procedure is scheduled on: Thurs 7/16 Report to Day Surgery. To find out your arrival time please call [phone] between [place] on Wed 7/15.  Remember: Instructions that are not followed completely may result in serious medical risk,  up to and including death, or upon the discretion of your surgeon and anesthesiologist your  surgery may need to be rescheduled.     _X__ 1. Do not eat food after midnight the night before your procedure.                 No gum chewing or hard candies. You may drink clear liquids up to 2 hours                 before you are scheduled to arrive for your surgery- DO not drink clear                 liquids within 2 hours of the start of your surgery.                 Clear Liquids include:  water, apple juice without pulp, clear carbohydrate                 drink such as Clearfast of Gatorade, Black Coffee or Tea (Do not add                 anything to coffee or tea).  __X__2.  On the morning of surgery brush your teeth with toothpaste and water, you                may rinse your mouth with mouthwash if you wish.  Do not swallow any toothpaste of mouthwash.     _X__ 3.  No Alcohol for 24 hours before or after surgery.   ___ 4.  Do Not Smoke or use e-cigarettes For 24 Hours Prior to Your Surgery.                 Do not use any chewable tobacco products for at least 6 hours prior to                 surgery.  ____  5.  Bring all medications with you on the day of surgery if instructed.   __x__  6.  Notify your doctor if there is any change in your medical condition      (cold, fever, infections).     Do not wear jewelry, make-up, hairpins, clips or nail polish. Do not wear lotions, powders, or perfumes. You may wear deodorant. Do not shave 48 hours prior to surgery. Men may shave face and neck. Do not bring valuables to the hospital.    [facility] is not responsible for any belongings or valuables.  Contacts, dentures  or bridgework may not be worn into surgery. Leave your suitcase in the car. After surgery it may be brought to your room. For patients admitted to the hospital, discharge time is determined by your treatment team.   Patients discharged the day of surgery will not be allowed to drive home.   Please read over the following fact sheets that you were given:    __x__ Take these medicines the morning of surgery with A SIP OF WATER:    1. acetaminophen (TYLENOL) 500 MG tablet if needed  2. carvedilol (COREG) 12.5 MG tablet  3. pantoprazole (PROTONIX) 40 MG tablet  4.  5.  6.  ____ Fleet Enema (as directed)  _x___ Use CHG Soap as directed  ____ Use inhalers on the day of surgery  ____ Stop metformin 2 days prior to surgery    ____ Take 1/2 of usual insulin dose the night before surgery. No insulin the morning          of surgery.   ____ Stop Coumadin/Plavix/aspirin on  x ____ Stop Anti-inflammatories meloxicam (MOBIC) 15 MG tablet today   ____ Stop supplements until after surgery.    __x__ Bring C-Pap to the hospital.

## 2018-10-31 LAB — URINE CULTURE

## 2018-11-03 ENCOUNTER — Encounter: Payer: Self-pay | Admitting: *Deleted

## 2018-11-03 ENCOUNTER — Other Ambulatory Visit: Payer: Self-pay

## 2018-11-03 ENCOUNTER — Other Ambulatory Visit
Admission: RE | Admit: 2018-11-03 | Discharge: 2018-11-03 | Disposition: A | Discharge status: Home / Self Care | Payer: Managed Care, Other (non HMO) | Source: Ambulatory Visit | Attending: Orthopedic Surgery | Admitting: Orthopedic Surgery

## 2018-11-03 DIAGNOSIS — Z1159 Encounter for screening for other viral diseases: Secondary | ICD-10-CM | POA: Insufficient documentation

## 2018-11-03 NOTE — Pre-Procedure Instructions (Signed)
KNOWN [facility]BBB 

## 2018-11-04 LAB — SARS CORONAVIRUS 2 (TAT 6-24 HRS): SARS Coronavirus 2: NEGATIVE

## 2018-11-06 ENCOUNTER — Inpatient Hospital Stay: Payer: Managed Care, Other (non HMO)

## 2018-11-06 ENCOUNTER — Encounter
Admission: RE | Disposition: A | Discharge status: Home Health | Payer: Self-pay | Source: Home / Self Care | Attending: Orthopedic Surgery

## 2018-11-06 ENCOUNTER — Inpatient Hospital Stay
Admission: RE | Admit: 2018-11-06 | Discharge: 2018-11-08 | DRG: 470 | Disposition: A | Discharge status: Home Health | Payer: Managed Care, Other (non HMO) | Attending: Orthopedic Surgery | Admitting: Orthopedic Surgery

## 2018-11-06 ENCOUNTER — Inpatient Hospital Stay: Payer: Managed Care, Other (non HMO) | Admitting: Certified Registered"

## 2018-11-06 ENCOUNTER — Encounter: Payer: Self-pay | Admitting: *Deleted

## 2018-11-06 ENCOUNTER — Other Ambulatory Visit: Payer: Self-pay

## 2018-11-06 DIAGNOSIS — Z96651 Presence of right artificial knee joint: Secondary | ICD-10-CM

## 2018-11-06 DIAGNOSIS — I10 Essential (primary) hypertension: Secondary | ICD-10-CM | POA: Diagnosis present

## 2018-11-06 DIAGNOSIS — G4730 Sleep apnea, unspecified: Secondary | ICD-10-CM | POA: Diagnosis present

## 2018-11-06 DIAGNOSIS — Z886 Allergy status to analgesic agent status: Secondary | ICD-10-CM

## 2018-11-06 DIAGNOSIS — M170 Bilateral primary osteoarthritis of knee: Secondary | ICD-10-CM | POA: Diagnosis present

## 2018-11-06 DIAGNOSIS — I471 Supraventricular tachycardia: Secondary | ICD-10-CM | POA: Diagnosis present

## 2018-11-06 DIAGNOSIS — Z791 Long term (current) use of non-steroidal anti-inflammatories (NSAID): Secondary | ICD-10-CM

## 2018-11-06 DIAGNOSIS — Z6841 Body Mass Index (BMI) 40.0 and over, adult: Secondary | ICD-10-CM

## 2018-11-06 DIAGNOSIS — K219 Gastro-esophageal reflux disease without esophagitis: Secondary | ICD-10-CM | POA: Diagnosis present

## 2018-11-06 DIAGNOSIS — G8918 Other acute postprocedural pain: Secondary | ICD-10-CM

## 2018-11-06 DIAGNOSIS — D62 Acute posthemorrhagic anemia: Secondary | ICD-10-CM | POA: Diagnosis not present

## 2018-11-06 DIAGNOSIS — I447 Left bundle-branch block, unspecified: Secondary | ICD-10-CM | POA: Diagnosis present

## 2018-11-06 HISTORY — PX: APPLICATION OF WOUND VAC: SHX5189

## 2018-11-06 HISTORY — PX: TOTAL KNEE ARTHROPLASTY: SHX125

## 2018-11-06 LAB — POCT PREGNANCY, URINE: Preg Test, Ur: NEGATIVE

## 2018-11-06 LAB — CBC
HCT: 36.8 % (ref 36.0–46.0)
Hemoglobin: 11.3 g/dL — ABNORMAL LOW (ref 12.0–15.0)
MCH: 25.7 pg — ABNORMAL LOW (ref 26.0–34.0)
MCHC: 30.7 g/dL (ref 30.0–36.0)
MCV: 83.6 fL (ref 80.0–100.0)
Platelets: 265 10*3/uL (ref 150–400)
RBC: 4.4 MIL/uL (ref 3.87–5.11)
RDW: 15 % (ref 11.5–15.5)
WBC: 10.4 10*3/uL (ref 4.0–10.5)
nRBC: 0 % (ref 0.0–0.2)

## 2018-11-06 LAB — ABO/RH: ABO/RH(D): O POS

## 2018-11-06 LAB — CREATININE, SERUM
Creatinine, Ser: 0.62 mg/dL (ref 0.44–1.00)
GFR calc Af Amer: >60 mL/min (ref >60)
GFR calc non Af Amer: >60 mL/min (ref >60)

## 2018-11-06 SURGERY — ARTHROPLASTY, KNEE, TOTAL
Anesthesia: Spinal | Site: Knee | Laterality: Right

## 2018-11-06 MED ORDER — EQ MULTIVITAMINS ADULT GUMMY PO CHEW: 2.0000 | CHEWABLE_TABLET | Freq: Every day | ORAL | Status: DC

## 2018-11-06 MED ORDER — PROPOFOL 500 MG/50ML IV EMUL
INTRAVENOUS | Status: DC | PRN
  Administered 2018-11-06: 30 ug/kg/min via INTRAVENOUS

## 2018-11-06 MED ORDER — KETOROLAC TROMETHAMINE 30 MG/ML IJ SOLN
INTRAMUSCULAR | Status: DC | PRN
  Administered 2018-11-06: 30 mg via INTRAMUSCULAR

## 2018-11-06 MED ORDER — MIDAZOLAM HCL 2 MG/2ML IJ SOLN
INTRAMUSCULAR | Status: AC
  Filled 2018-11-06: qty 2

## 2018-11-06 MED ORDER — BUPIVACAINE-EPINEPHRINE (PF) 0.25% -1:200000 IJ SOLN
INTRAMUSCULAR | Status: DC | PRN
  Administered 2018-11-06: 30 mL

## 2018-11-06 MED ORDER — MAGNESIUM HYDROXIDE 400 MG/5ML PO SUSP: 30.0000 mL | Freq: Every day | ORAL | Status: DC | PRN

## 2018-11-06 MED ORDER — MIDAZOLAM HCL 5 MG/5ML IJ SOLN
INTRAMUSCULAR | Status: DC | PRN
  Administered 2018-11-06: 2 mg via INTRAVENOUS

## 2018-11-06 MED ORDER — ALUM & MAG HYDROXIDE-SIMETH 200-200-20 MG/5ML PO SUSP: 30.0000 mL | ORAL | Status: DC | PRN

## 2018-11-06 MED ORDER — FAMOTIDINE 20 MG PO TABS
ORAL_TABLET | ORAL | Status: AC
  Filled 2018-11-06: qty 1

## 2018-11-06 MED ORDER — PROPOFOL 500 MG/50ML IV EMUL
INTRAVENOUS | Status: AC
  Filled 2018-11-06: qty 50

## 2018-11-06 MED ORDER — ONDANSETRON HCL 4 MG/2ML IJ SOLN
4.0000 mg | Freq: Four times a day (QID) | INTRAMUSCULAR | Status: DC | PRN
  Administered 2018-11-06 – 2018-11-07 (×2): 4 mg via INTRAVENOUS
  Filled 2018-11-06 (×3): qty 2

## 2018-11-06 MED ORDER — BUPIVACAINE LIPOSOME 1.3 % IJ SUSP
INTRAMUSCULAR | Status: AC
  Filled 2018-11-06: qty 20

## 2018-11-06 MED ORDER — OXYCODONE HCL 5 MG PO TABS
10.0000 mg | ORAL_TABLET | ORAL | Status: DC | PRN
  Administered 2018-11-06 – 2018-11-08 (×7): 10 mg via ORAL
  Filled 2018-11-06 (×6): qty 2

## 2018-11-06 MED ORDER — FENTANYL CITRATE (PF) 100 MCG/2ML IJ SOLN
INTRAMUSCULAR | Status: DC | PRN
  Administered 2018-11-06: 100 ug via INTRAVENOUS

## 2018-11-06 MED ORDER — ZOLPIDEM TARTRATE 5 MG PO TABS: 5.0000 mg | ORAL_TABLET | Freq: Every evening | ORAL | Status: DC | PRN

## 2018-11-06 MED ORDER — LACTATED RINGERS IV SOLN
INTRAVENOUS | Status: DC
  Administered 2018-11-06 (×2): via INTRAVENOUS

## 2018-11-06 MED ORDER — PROPOFOL 10 MG/ML IV BOLUS
INTRAVENOUS | Status: DC | PRN
  Administered 2018-11-06: 40 mg via INTRAVENOUS

## 2018-11-06 MED ORDER — MENTHOL 3 MG MT LOZG
1.0000 | LOZENGE | OROMUCOSAL | Status: DC | PRN
  Filled 2018-11-06: qty 9

## 2018-11-06 MED ORDER — ADULT MULTIVITAMIN W/MINERALS CH
1.0000 | ORAL_TABLET | Freq: Every day | ORAL | Status: DC
  Administered 2018-11-07 – 2018-11-08 (×2): 1 via ORAL
  Filled 2018-11-06 (×2): qty 1

## 2018-11-06 MED ORDER — DEXTROSE 5 % IV SOLN
3.0000 g | Freq: Four times a day (QID) | INTRAVENOUS | Status: AC
  Administered 2018-11-06 – 2018-11-07 (×3): 3 g via INTRAVENOUS
  Filled 2018-11-06 (×3): qty 3

## 2018-11-06 MED ORDER — SODIUM CHLORIDE 0.9 % IV SOLN
INTRAVENOUS | Status: DC | PRN
  Administered 2018-11-06: 10:00:00 60 mL

## 2018-11-06 MED ORDER — HYDROMORPHONE HCL 1 MG/ML IJ SOLN
0.5000 mg | INTRAMUSCULAR | Status: DC | PRN
  Administered 2018-11-06: 0.5 mg via INTRAVENOUS
  Filled 2018-11-06: qty 1

## 2018-11-06 MED ORDER — NEOMYCIN-POLYMYXIN B GU 40-200000 IR SOLN
Status: DC | PRN
  Administered 2018-11-06: 16 mL

## 2018-11-06 MED ORDER — SODIUM CHLORIDE 0.9 % IV SOLN
INTRAVENOUS | Status: DC
  Administered 2018-11-06: 15:00:00 via INTRAVENOUS

## 2018-11-06 MED ORDER — PROPOFOL 10 MG/ML IV BOLUS
INTRAVENOUS | Status: AC
  Filled 2018-11-06: qty 20

## 2018-11-06 MED ORDER — MORPHINE SULFATE 10 MG/ML IJ SOLN
INTRAMUSCULAR | Status: DC | PRN
  Administered 2018-11-06: 10 mg via INTRAMUSCULAR

## 2018-11-06 MED ORDER — ACETAMINOPHEN 325 MG PO TABS: 325.0000 mg | ORAL_TABLET | Freq: Four times a day (QID) | ORAL | Status: DC | PRN

## 2018-11-06 MED ORDER — DOCUSATE SODIUM 100 MG PO CAPS
100.0000 mg | ORAL_CAPSULE | Freq: Two times a day (BID) | ORAL | Status: DC
  Administered 2018-11-06 – 2018-11-08 (×5): 100 mg via ORAL
  Filled 2018-11-06 (×5): qty 1

## 2018-11-06 MED ORDER — DIPHENHYDRAMINE HCL 12.5 MG/5ML PO ELIX: 12.5000 mg | ORAL_SOLUTION | ORAL | Status: DC | PRN

## 2018-11-06 MED ORDER — METHOCARBAMOL 500 MG PO TABS
500.0000 mg | ORAL_TABLET | Freq: Four times a day (QID) | ORAL | Status: DC | PRN
  Administered 2018-11-06 – 2018-11-07 (×2): 500 mg via ORAL
  Filled 2018-11-06 (×2): qty 1

## 2018-11-06 MED ORDER — ONDANSETRON HCL 4 MG PO TABS: 4.0000 mg | ORAL_TABLET | Freq: Four times a day (QID) | ORAL | Status: DC | PRN

## 2018-11-06 MED ORDER — PANTOPRAZOLE SODIUM 40 MG PO TBEC
40.0000 mg | DELAYED_RELEASE_TABLET | Freq: Every day | ORAL | Status: DC
  Administered 2018-11-07 – 2018-11-08 (×2): 40 mg via ORAL
  Filled 2018-11-06 (×2): qty 1

## 2018-11-06 MED ORDER — METOCLOPRAMIDE HCL 5 MG/ML IJ SOLN: 5.0000 mg | Freq: Three times a day (TID) | INTRAMUSCULAR | Status: DC | PRN

## 2018-11-06 MED ORDER — METOCLOPRAMIDE HCL 10 MG PO TABS: 5.0000 mg | ORAL_TABLET | Freq: Three times a day (TID) | ORAL | Status: DC | PRN

## 2018-11-06 MED ORDER — PROMETHAZINE HCL 25 MG/ML IJ SOLN: 6.2500 mg | INTRAMUSCULAR | Status: DC | PRN

## 2018-11-06 MED ORDER — VANCOMYCIN HCL 1000 MG IV SOLR
INTRAVENOUS | Status: DC | PRN
  Administered 2018-11-06: 1000 mg

## 2018-11-06 MED ORDER — PHENOL 1.4 % MT LIQD
1.0000 | OROMUCOSAL | Status: DC | PRN
  Filled 2018-11-06: qty 177

## 2018-11-06 MED ORDER — METHOCARBAMOL 1000 MG/10ML IJ SOLN
500.0000 mg | Freq: Four times a day (QID) | INTRAVENOUS | Status: DC | PRN
  Filled 2018-11-06: qty 5

## 2018-11-06 MED ORDER — FENTANYL CITRATE (PF) 100 MCG/2ML IJ SOLN: 25.0000 ug | INTRAMUSCULAR | Status: DC | PRN

## 2018-11-06 MED ORDER — BUPIVACAINE-EPINEPHRINE (PF) 0.25% -1:200000 IJ SOLN
INTRAMUSCULAR | Status: AC
  Filled 2018-11-06: qty 30

## 2018-11-06 MED ORDER — CARVEDILOL 12.5 MG PO TABS
12.5000 mg | ORAL_TABLET | Freq: Two times a day (BID) | ORAL | Status: DC
  Administered 2018-11-06 – 2018-11-08 (×4): 12.5 mg via ORAL
  Filled 2018-11-06: qty 1
  Filled 2018-11-06: qty 4
  Filled 2018-11-06 (×2): qty 1

## 2018-11-06 MED ORDER — OXYCODONE HCL 5 MG PO TABS
5.0000 mg | ORAL_TABLET | ORAL | Status: DC | PRN
  Administered 2018-11-06 – 2018-11-07 (×2): 5 mg via ORAL
  Filled 2018-11-06: qty 1
  Filled 2018-11-06: qty 2
  Filled 2018-11-06 (×2): qty 1

## 2018-11-06 MED ORDER — SODIUM CHLORIDE (PF) 0.9 % IJ SOLN
INTRAMUSCULAR | Status: AC
  Filled 2018-11-06: qty 50

## 2018-11-06 MED ORDER — BUPIVACAINE HCL (PF) 0.5 % IJ SOLN
INTRAMUSCULAR | Status: AC
  Filled 2018-11-06: qty 10

## 2018-11-06 MED ORDER — VANCOMYCIN HCL 1000 MG IV SOLR
INTRAVENOUS | Status: AC
  Filled 2018-11-06: qty 2000

## 2018-11-06 MED ORDER — MORPHINE SULFATE (PF) 10 MG/ML IV SOLN
INTRAVENOUS | Status: AC
  Filled 2018-11-06: qty 1

## 2018-11-06 MED ORDER — PHENYLEPHRINE HCL (PRESSORS) 10 MG/ML IV SOLN
INTRAVENOUS | Status: DC | PRN
  Administered 2018-11-06 (×4): 100 ug via INTRAVENOUS

## 2018-11-06 MED ORDER — DEXTROSE 5 % IV SOLN
3.0000 g | Freq: Once | INTRAVENOUS | Status: AC
  Administered 2018-11-06: 08:00:00 3 g via INTRAVENOUS
  Filled 2018-11-06: qty 3

## 2018-11-06 MED ORDER — ACETAMINOPHEN 500 MG PO TABS
1000.0000 mg | ORAL_TABLET | Freq: Four times a day (QID) | ORAL | Status: AC
  Administered 2018-11-06 – 2018-11-07 (×4): 1000 mg via ORAL
  Filled 2018-11-06 (×4): qty 2

## 2018-11-06 MED ORDER — EPHEDRINE SULFATE 50 MG/ML IJ SOLN
INTRAMUSCULAR | Status: DC | PRN
  Administered 2018-11-06: 10 mg via INTRAVENOUS

## 2018-11-06 MED ORDER — BISACODYL 5 MG PO TBEC
5.0000 mg | DELAYED_RELEASE_TABLET | Freq: Every day | ORAL | Status: DC | PRN
  Administered 2018-11-08: 5 mg via ORAL
  Filled 2018-11-06: qty 1

## 2018-11-06 MED ORDER — ENOXAPARIN SODIUM 30 MG/0.3ML ~~LOC~~ SOLN: 30.0000 mg | Freq: Two times a day (BID) | SUBCUTANEOUS | Status: DC

## 2018-11-06 MED ORDER — TRAMADOL HCL 50 MG PO TABS
50.0000 mg | ORAL_TABLET | Freq: Four times a day (QID) | ORAL | Status: DC
  Administered 2018-11-06 – 2018-11-08 (×9): 50 mg via ORAL
  Filled 2018-11-06 (×10): qty 1

## 2018-11-06 MED ORDER — MAGNESIUM CITRATE PO SOLN
1.0000 | Freq: Once | ORAL | Status: DC | PRN
  Filled 2018-11-06: qty 296

## 2018-11-06 MED ORDER — FENTANYL CITRATE (PF) 100 MCG/2ML IJ SOLN
INTRAMUSCULAR | Status: AC
  Filled 2018-11-06: qty 2

## 2018-11-06 MED ORDER — SODIUM CHLORIDE (PF) 0.9 % IJ SOLN
INTRAMUSCULAR | Status: DC | PRN
  Administered 2018-11-06: 40 mL

## 2018-11-06 SURGICAL SUPPLY — 75 items
BANDAGE ACE 6X5 VEL STRL LF (GAUZE/BANDAGES/DRESSINGS) ×3 IMPLANT
BLADE SAGITTAL 25.0X1.19X90 (BLADE) ×2 IMPLANT
BLADE SAGITTAL 25.0X1.19X90MM (BLADE) ×1
BLADE SAW 90X13X1.19 OSCILLAT (BLADE) ×3 IMPLANT
BLOCK CUTTING FEMUR 3+ RT MED (MISCELLANEOUS) ×3 IMPLANT
BLOCK CUTTING TIBIAL 2 RT (MISCELLANEOUS) ×3 IMPLANT
BLOCK CUTTING TIBIAL 4 RT MIS (MISCELLANEOUS) ×3 IMPLANT
BONE CEMENT GENTAMICIN (Cement) ×6 IMPLANT
CANISTER SUCT 1200ML W/VALVE (MISCELLANEOUS) ×3 IMPLANT
CANISTER SUCT 3000ML PPV (MISCELLANEOUS) ×6 IMPLANT
CANISTER WOUND CARE 500ML ATS (WOUND CARE) ×3 IMPLANT
CEMENT BONE GENTAMICIN 40 (Cement) ×2 IMPLANT
CHLORAPREP W/TINT 26 (MISCELLANEOUS) ×6 IMPLANT
COOLER POLAR GLACIER W/PUMP (MISCELLANEOUS) ×3 IMPLANT
COVER WAND RF STERILE (DRAPES) ×3 IMPLANT
CUFF TOURN SGL QUICK 24 (TOURNIQUET CUFF)
CUFF TOURN SGL QUICK 30 (TOURNIQUET CUFF)
CUFF TOURN SGL QUICK 42 (TOURNIQUET CUFF) ×3 IMPLANT
CUFF TRNQT CYL 24X4X16.5-23 (TOURNIQUET CUFF) IMPLANT
CUFF TRNQT CYL 30X4X21-28X (TOURNIQUET CUFF) IMPLANT
DRAPE 3/4 80X56 (DRAPES) ×6 IMPLANT
ELECT CAUTERY BLADE 6.4 (BLADE) ×3 IMPLANT
ELECT REM PT RETURN 9FT ADLT (ELECTROSURGICAL) ×3
ELECTRODE REM PT RTRN 9FT ADLT (ELECTROSURGICAL) ×1 IMPLANT
FEMORAL COMP SZ3P RT SPHERE (Femur) ×3 IMPLANT
FEMUR BONE MODEL (MISCELLANEOUS) ×3 IMPLANT
GAUZE SPONGE 4X4 12PLY STRL (GAUZE/BANDAGES/DRESSINGS) ×3 IMPLANT
GAUZE XEROFORM 1X8 LF (GAUZE/BANDAGES/DRESSINGS) ×3 IMPLANT
GLOVE BIOGEL PI IND STRL 9 (GLOVE) ×1 IMPLANT
GLOVE BIOGEL PI INDICATOR 9 (GLOVE) ×2
GLOVE INDICATOR 8.0 STRL GRN (GLOVE) ×3 IMPLANT
GLOVE SURG ORTHO 8.0 STRL STRW (GLOVE) ×3 IMPLANT
GLOVE SURG SYN 9.0  PF PI (GLOVE) ×2
GLOVE SURG SYN 9.0 PF PI (GLOVE) ×1 IMPLANT
GOWN SRG 2XL LVL 4 RGLN SLV (GOWNS) ×1 IMPLANT
GOWN STRL NON-REIN 2XL LVL4 (GOWNS) ×2
GOWN STRL REUS W/ TWL LRG LVL3 (GOWN DISPOSABLE) ×1 IMPLANT
GOWN STRL REUS W/ TWL XL LVL3 (GOWN DISPOSABLE) ×1 IMPLANT
GOWN STRL REUS W/TWL LRG LVL3 (GOWN DISPOSABLE) ×2
GOWN STRL REUS W/TWL XL LVL3 (GOWN DISPOSABLE) ×2
HOLDER FOLEY CATH W/STRAP (MISCELLANEOUS) ×3 IMPLANT
HOOD PEEL AWAY FLYTE STAYCOOL (MISCELLANEOUS) ×6 IMPLANT
INSERT TIBIAL SZ2 RT (Insert) ×3 IMPLANT
KIT PREVENA INCISION MGT20CM45 (CANNISTER) ×3 IMPLANT
KIT STIMULAN RAPID CURE 5CC (Orthopedic Implant) ×3 IMPLANT
KIT TURNOVER KIT A (KITS) ×3 IMPLANT
NDL SAFETY ECLIPSE 18X1.5 (NEEDLE) ×1 IMPLANT
NEEDLE HYPO 18GX1.5 SHARP (NEEDLE) ×2
NEEDLE SPNL 18GX3.5 QUINCKE PK (NEEDLE) ×3 IMPLANT
NEEDLE SPNL 20GX3.5 QUINCKE YW (NEEDLE) ×3 IMPLANT
NS IRRIG 1000ML POUR BTL (IV SOLUTION) ×3 IMPLANT
PACK TOTAL KNEE (MISCELLANEOUS) ×3 IMPLANT
PAD WRAPON POLAR KNEE (MISCELLANEOUS) ×1 IMPLANT
PATELLA RESURFACING MEDACTA 02 (Bone Implant) ×3 IMPLANT
PULSAVAC PLUS IRRIG FAN TIP (DISPOSABLE) ×3
SCALPEL PROTECTED #10 DISP (BLADE) ×6 IMPLANT
SOL .9 NS 3000ML IRR  AL (IV SOLUTION) ×2
SOL .9 NS 3000ML IRR UROMATIC (IV SOLUTION) ×1 IMPLANT
STAPLER SKIN PROX 35W (STAPLE) ×3 IMPLANT
STEM EXTENSION 11MMX30MM (Stem) ×3 IMPLANT
STOCKINETTE IMPERV 14X48 (MISCELLANEOUS) ×3 IMPLANT
SUCTION FRAZIER HANDLE 10FR (MISCELLANEOUS) ×2
SUCTION TUBE FRAZIER 10FR DISP (MISCELLANEOUS) ×1 IMPLANT
SUT DVC 2 QUILL PDO  T11 36X36 (SUTURE) ×2
SUT DVC 2 QUILL PDO T11 36X36 (SUTURE) ×1 IMPLANT
SUT ETHIBOND 2 V 37 (SUTURE) ×3 IMPLANT
SUT V-LOC 90 ABS DVC 3-0 CL (SUTURE) ×3 IMPLANT
SYR 20CC LL (SYRINGE) ×3 IMPLANT
SYR 50ML LL SCALE MARK (SYRINGE) ×6 IMPLANT
TIB TRAY SZ 2 R FIXED (Joint) ×3 IMPLANT
TIP FAN IRRIG PULSAVAC PLUS (DISPOSABLE) ×1 IMPLANT
TOWEL OR 17X26 4PK STRL BLUE (TOWEL DISPOSABLE) ×3 IMPLANT
TOWER CARTRIDGE SMART MIX (DISPOSABLE) ×3 IMPLANT
TRAY FOLEY MTR SLVR 16FR STAT (SET/KITS/TRAYS/PACK) ×3 IMPLANT
WRAPON POLAR PAD KNEE (MISCELLANEOUS) ×3

## 2018-11-06 NOTE — Progress Notes (Signed)
Physical Therapy Evaluation Patient Details Name: [name] [name] MRN: [id number] DOB: 1970/08/17 Today's Date: 11/06/2018   History of Present Illness  Patient scheduled for R TKR., now post op TKR .  Clinical Impression  Patient is s/p R TKR.Strength of RLE is 2/5 right hip flex and abd, -3/5 right knee flex/ext.  She needs mod assist for bed mobility supine <>sit , mod assist for transfers sit to stand with B RW. She is able to stand with BRW and side step 4 steps up to the head of the bed. She is able to sit at the EOB x 15 mins and performs AROM and dangling RLE. AROM is -5 to  60 deg right knee. She will benefit from skilled PT to improve mobility and strength.     Follow Up Recommendations Home health PT    Equipment Recommendations  Rolling walker with 5" wheels;Other (comment)    Recommendations for Other Services       Precautions / Restrictions        Mobility  Bed Mobility Overal bed mobility: Needs Assistance Bed Mobility: Supine to Sit;Sit to Supine     Supine to sit: Mod assist Sit to supine: Mod assist   General bed mobility comments: needs vc with sequencing   Transfers Overall transfer level: Needs assistance Equipment used: (Barriatric RW)             General transfer comment: vc for sequencing, raised height of bed   Ambulation/Gait                Stairs            Wheelchair Mobility    Modified Rankin (Stroke Patients Only)       Balance Overall balance assessment: Independent                                           Pertinent Vitals/Pain Pain Assessment: Faces Faces Pain Scale: Hurts little more Pain Location: knee right Pain Descriptors / Indicators: Aching Pain Intervention(s): Limited activity within patient's tolerance;Monitored during session    Home Living Family/patient expects to be discharged to:: Private residence Living Arrangements: Spouse/significant other Available Help at  Discharge: Family Type of Home: House Home Access: Stairs to enter Entrance Stairs-Rails: Doctor, general practiceight;Left Entrance Stairs-Number of Steps: 4 Home Layout: One level        Prior Function Level of Independence: Independent               Hand Dominance        Extremity/Trunk Assessment   Upper Extremity Assessment Upper Extremity Assessment: Overall WFL for tasks assessed    Lower Extremity Assessment Lower Extremity Assessment: RLE deficits/detail RLE Deficits / Details: 2/5 right hip, knee -3/5 flex/ext RLE Sensation: (NT due to leg ace bandages/wrapings)       Communication   Communication: No difficulties  Cognition Arousal/Alertness: Awake/alert Behavior During Therapy: WFL for tasks assessed/performed Overall Cognitive Status: Within Functional Limits for tasks assessed                                        General Comments      Exercises Total Joint Exercises Long Arc Quad: AROM;Right;10 reps Knee Flexion: AROM;10 reps Goniometric ROM: (-5-60 deg  right knee)   Assessment/Plan  PT Assessment Patient needs continued PT services  PT Problem List Decreased strength;Decreased range of motion;Decreased activity tolerance;Obesity;Pain       PT Treatment Interventions Gait training;Therapeutic activities;Therapeutic exercise    PT Goals (Current goals can be found in the Care Plan section)  Acute Rehab PT Goals Patient Stated Goal: (to walk) PT Goal Formulation: With patient Time For Goal Achievement: 11/20/18 Potential to Achieve Goals: Good    Frequency BID   Barriers to discharge        Co-evaluation               AM-PAC PT "6 Clicks" Mobility  Outcome Measure Help needed turning from your back to your side while in a flat bed without using bedrails?: A Lot Help needed moving from lying on your back to sitting on the side of a flat bed without using bedrails?: A Lot Help needed moving to and from a bed to a chair  (including a wheelchair)?: A Lot Help needed standing up from a chair using your arms (e.g., wheelchair or bedside chair)?: A Lot Help needed to walk in hospital room?: A Lot Help needed climbing 3-5 steps with a railing? : A Lot 6 Click Score: 12    End of Session   Activity Tolerance: Patient limited by fatigue;Patient limited by pain     PT Visit Diagnosis: Muscle weakness (generalized) (M62.81);Difficulty in walking, not elsewhere classified (R26.2)    Time: 1730-1810 PT Time Calculation (min) (ACUTE ONLY): 40 min   Charges:   PT Evaluation $PT Eval Low Complexity: 1 Low PT Treatments $Therapeutic Exercise: 8-22 mins $Therapeutic Activity: 8-22 mins          Alanson Puls, PT DPT 11/06/2018, 6:22 PM

## 2018-11-06 NOTE — Anesthesia Preprocedure Evaluation (Signed)
Anesthesia Evaluation  Patient identified by MRN, date of birth, ID band Patient awake    Reviewed: Allergy & Precautions, NPO status , Patient's Chart, lab work & pertinent test results  History of Anesthesia Complications (+) PONVNegative for: history of anesthetic complications  Airway Mallampati: I  TM Distance: >3 FB     Dental  (+) Dental Advidsory Given   Pulmonary neg shortness of breath, sleep apnea and Continuous Positive Airway Pressure Ventilation , neg recent URI,           Cardiovascular Exercise Tolerance: Good hypertension, Pt. on medications and Pt. on home beta blockers (-) angina(-) Past MI + dysrhythmias (atrial tachycardia, LBBB) (-) Valvular Problems/Murmurs     Neuro/Psych negative neurological ROS     GI/Hepatic Neg liver ROS, GERD  Medicated and Controlled,  Endo/Other  Morbid obesity  Renal/GU negative Renal ROS     Musculoskeletal negative musculoskeletal ROS (+)   Abdominal   Peds  Hematology negative hematology ROS (+)   Anesthesia Other Findings Past Medical History: 08/16/2016: Appendicitis No date: Arthritis No date: Atrial tachycardia, paroxysmal ([place])     Comment:  a. s/p ablation in 2009 by Dr. Elonda Husky in [facility]               (falied); b. Managed w/ beta blocker. No date: GERD (gastroesophageal reflux disease) No date: Hypertension No date: LBBB (left bundle branch block) No date: Obesity No date: Sleep apnea     Comment:  cpap   Reproductive/Obstetrics                             Anesthesia Physical  Anesthesia Plan  ASA: III  Anesthesia Plan: Spinal   Post-op Pain Management:    Induction: Intravenous  PONV Risk Score and Plan: 3 and Propofol infusion and TIVA  Airway Management Planned: Natural Airway and Simple Face Mask  Additional Equipment:   Intra-op Plan:   Post-operative Plan:   Informed Consent: I have reviewed the  patients History and Physical, chart, labs and discussed the procedure including the risks, benefits and alternatives for the proposed anesthesia with the patient or authorized representative who has indicated his/her understanding and acceptance.       Plan Discussed with:   Anesthesia Plan Comments:         Anesthesia Quick Evaluation

## 2018-11-06 NOTE — TOC Benefit Eligibility Note (Signed)
Transition of Care [facility]) Benefit Eligibility Note    Patient Details  Name: [name] [name] MRN: [id number] Date of Birth: 1970/06/05   Medication/Dose: Enoxaparin 32m once daily for 14 days.  Covered?: Yes  Prescription [place]age [place]Company/Phone Number:: [place] [employer] at [phone] Co-Pay: $100 estimated copay  Prior Approval: No  Deductible: (No deductible.  $5,000 individual out of pocket, $2,343.48 met as of time of call.)   HDannette BarbaraPhone Number: [phone] [phone]/16/2020, 1:36 PM

## 2018-11-06 NOTE — TOC Initial Note (Signed)
Transition of Care [facility]) - Initial/Assessment Note    Patient Details  Name: [name] [name] MRN: [id number] Date of Birth: 05-29-1970  Transition of [facility]) CM/SW Contact:    Bill Mcvey, Lenice Llamas Phone Number: [phone]  11/06/2018, 4:29 PM  Clinical Narrative: Clinical Social Worker (CSW) met with patient to discuss D/C plan. Patient was alert and oriented X4 and was laying in the bed. CSW introduced self and explained role of CSW department. Per patient she lives in [place] located at [address]. [place], [place] [address]. Per patient she lives with her husband Elberta Fortis and her adult son is home from college and living with her. Per patient she will have her husband and son for support. Patient requested a walker and bedside commode. CSW made [place] [employer] agency representative aware that patient will need a bariatric rolling walker and bedside commode. CSW asked MD to put in order for bariatric DME. CSW explained to patient that her surgeon prefers [facility] for home health. Patient is agreeable to [facility]. CSW notifyed patient of her Lovenox price $100. Patient reported that she can afford her Lovenox. CSW will continue to follow and assist as needed.                 Expected Discharge Plan: [place] Barriers to Discharge: Continued Medical Work up   Patient Goals and CMS Choice Patient states their goals for this hospitalization and ongoing recovery are:: To improve mobility   Choice offered to / list presented to : Patient  Expected Discharge Plan and Services Expected Discharge Plan: [place] In-house Referral: Clinical Social Work Discharge Planning Services: CM Consult Post Acute Care Choice: [place] arrangements for the past 2 months: Single Family Home                 DME Arranged: Bedside commode, Walker rolling DME Agency: [employer] Date DME Agency Contacted: 11/06/18 Time DME Agency Contacted:  [id number] Representative spoke with at DME Agency: [place]: PT [place]: [facility] (formerly [employer]) Date [place]: 11/06/18   Representative spoke with at [place]: Helene Kelp  Prior Living Arrangements/Services Living arrangements for the past 2 months: [place] with:: Adult Children, Spouse Patient language and need for interpreter reviewed:: No Do you feel safe going back to the place where you live?: Yes      Need for Family Participation in Patient Care: No (Comment) Care giver support system in place?: Yes (comment)   Criminal Activity/Legal Involvement Pertinent to Current Situation/Hospitalization: No - Comment as needed  Activities of Daily Living Home Assistive Devices/Equipment: None ADL Screening (condition at time of admission) Patient's cognitive ability adequate to safely complete daily activities?: No Is the patient deaf or have difficulty hearing?: No Does the patient have difficulty seeing, even when wearing glasses/contacts?: No Does the patient have difficulty concentrating, remembering, or making decisions?: No Patient able to express need for assistance with ADLs?: Yes Does the patient have difficulty dressing or bathing?: No Independently performs ADLs?: Yes (appropriate for developmental age) Does the patient have difficulty walking or climbing stairs?: No Weakness of Legs: None Weakness of Arms/Hands: None  Permission Sought/Granted Permission sought to share information with : (Home Health and DME agency) Permission granted to share information with : Yes, Verbal Permission Granted              Emotional Assessment Appearance:: Appears stated age  Affect (typically observed): Pleasant, Calm Orientation: : Oriented to Self, Oriented to Place, Oriented to  Time, Oriented to Situation Alcohol / Substance Use: Not Applicable Psych Involvement: No (comment)  Admission diagnosis:  PRIMARY  OSTEOARTHRITIS OF RIGHT KNEE Patient Active Problem List   Diagnosis Date Noted  . Status post total knee replacement using cement, right 11/06/2018  . Sprain of ankle 11/20/2017  . Acute left-sided back pain 09/19/2017  . Encounter for biometric screening 09/19/2017  . Gastroesophageal reflux disease without esophagitis 09/19/2017  . Cyst of right ovary 07/03/2017  . DUB (dysfunctional uterine bleeding) 07/03/2017  . Vaginal mass 07/03/2017  . Osteoarthritis of knee 09/19/2016  . Sleep apnea   . Appendicitis, acute 08/16/2016  . Pain in joint, lower leg 01/12/2014  . Obesity   . Hypertension   . Atrial tachycardia, paroxysmal ([place])   . LBBB (left bundle branch block)    PCP:  Algis Greenhouse, MD Pharmacy:   [facility] [place], [place] - [address] AT [address] [place] [address] Phone: [phone] Fax: [phone]     Social Determinants of Health (SDOH) Interventions    Readmission Risk Interventions No flowsheet data found.

## 2018-11-06 NOTE — Op Note (Signed)
11/06/2018  10:37 AM  PATIENT:  [name] [name]  48 y.o. female  PRE-OPERATIVE DIAGNOSIS:  PRIMARY OSTEOARTHRITIS OF RIGHT KNEE  POST-OPERATIVE DIAGNOSIS:  osteoarthritis right knee  PROCEDURE:  Procedure(s) with comments: RIGHT TOTAL KNEE ARTHROPLASTY (Right) APPLICATION OF WOUND VAC (Right) - [id number]  SURGEON: Leitha SchullerMichael J Genice Kimberlin, MD  ASSISTANTS: Cranston Neighborhris Gaines, PA-C  ANESTHESIA:   spinal  EBL:  Total I/O In: 1000 [I.V.:1000] Out: 250 [Urine:150; Blood:100]  BLOOD ADMINISTERED:none  DRAINS: none   LOCAL MEDICATIONS USED:  MARCAINE    and OTHER Exparel morphine and Toradol  SPECIMEN:  No Specimen  DISPOSITION OF SPECIMEN:  N/A  COUNTS:  YES  TOURNIQUET:   100 minutes at 300 mmHg  IMPLANTS: Medacta GMK sphere 3+ femur, 2 right tibia with short stem and 10 mm insert and 2 patella all components cemented.  DICTATION: Reubin Milan.Dragon Dictation  patient was brought to the operating room and after adequatespinalanesthesia was obtained theright leg was prepped and draped in the usual sterile fashion. After patient identification and timeout procedures were completed, the tourniquet was raised and a midline skin incision was made followed by medial parapatellar arthrotomy. there is exposed bone throughout the entire medial and lateral femoral and tibial condyles withseverepatellofemoral. The ACL and fat pad were excised and the anterior horns of the meniscus were excised.  In the proximal tibia cutting guide from the [facility] LLCMYKNEEsystem was applied proximal tibia cut carried out.  This was carried out in less flexion than usual secondary to the patient's large size and very difficult exposure secondary to her lack of motion with only approximately 20 to 60 degrees range of motion with anesthesia present. The distal femoral cut was carried out in a similar fashion and the4cutting guide applied with anterior posterior and chamfer cuts made. The posterior horns of the menisci were removed at  this point.  There were very large posterior osteophytes on the tibia and both medial l and lateral femoral condyles.  They were excised with some difficulty secondary difficulty getting the femur elevated but this was subsequently done successfully. The 2tibia baseplate trial was placed pinned into position and proximal tibial preparation carried out with drilling hand reaming and the keel punch followed by placement of the3+femur and sizing the tibial insert size 10gave the best fit with stability and full extension. The distal femoral drill holes were made in the notch cut for the trochlear groove was then carried out with trials were then removed the patella was cut using the patellar cutting guide and it sized to a size2, afterdrill holes have been made.At this point the above local was infiltrated in the periarticular tissues and periosteum. The knee was irrigated with pulsatile lavage and the bony surfaces dried the tibial component was cemented into place first. Excess cement was removed and the polyethylene insert placed with a torque screw placed with a torque screwdriver tightened. The distal femoral component was placed and the knee was held in extension as the patellar button was clamped into place. After the cement was set excess cement was removed and the knee was again irrigated thoroughly thoroughly irrigated. The tourniquet was let down and hemostasis checked electrocautery. Prior to closure Stimulan beads with vancomycin were placed in both gutters to try to aid in avoidance of any postop infection.  A partial lateral release was carried out to get this patella to be more in the midline.  The arthrotomy was repaired with a heavy Quill suture, #2 Ethibond followed by 3-0 V lock subcuticular closure,skin  staples,incisional wound VAC secondary to the size of the leg present  and Ace wrapand Polar Care  PLAN OF CARE: Admit to inpatient   PATIENT DISPOSITION:  PACU -  hemodynamically stable.

## 2018-11-06 NOTE — Transfer of Care (Signed)
Immediate Anesthesia Transfer of Care Note  Patient: [name] [name]  Procedure(s) Performed: RIGHT TOTAL KNEE ARTHROPLASTY (Right Knee) APPLICATION OF WOUND VAC (Right Knee)  Patient Location: PACU  Anesthesia Type:Spinal  Level of Consciousness: awake, alert  and oriented  Airway & Oxygen Therapy: Patient Spontanous Breathing  Post-op Assessment: Report given to RN and Post -op Vital signs reviewed and stable  Post vital signs: Reviewed and stable  Last Vitals:  Vitals Value Taken Time  BP    Temp    Pulse    Resp    SpO2      Last Pain:  Vitals:   11/06/18 0618  TempSrc: Tympanic  PainSc: 7          Complications: No apparent anesthesia complications

## 2018-11-06 NOTE — Anesthesia Procedure Notes (Signed)
Spinal  Patient location during procedure: OR Start time: 11/06/2018 7:47 AM Staffing Anesthesiologist: Martha Clan, MD Resident/CRNA: Kemper Hochman, Einar Grad, CRNA Performed: resident/CRNA  Preanesthetic Checklist Completed: patient identified, site marked, surgical consent, pre-op evaluation, timeout performed, IV checked, risks and benefits discussed and monitors and equipment checked Spinal Block Patient position: sitting Prep: DuraPrep Patient monitoring: heart rate, cardiac monitor, continuous pulse ox and blood pressure Approach: midline Location: L3-4 Injection technique: single-shot Needle Needle type: Whitacre  Needle gauge: 25 G Needle length: 9 cm Assessment Sensory level: T4

## 2018-11-06 NOTE — TOC Progression Note (Signed)
Transition of Care [facility]) - Progression Note    Patient Details  Name: [name] [name] MRN: [id number] Date of Birth: [birth year]  [facility]) CM/SW Contact  Britnie Colville, Lenice Llamas Phone Number: [phone]  11/06/2018, 11:54 AM  Clinical Narrative: Lovenox price requested.           Expected Discharge Plan and Services                                                 Social Determinants of Health (SDOH) Interventions    Readmission Risk Interventions No flowsheet data found.

## 2018-11-06 NOTE — Anesthesia Post-op Follow-up Note (Signed)
Anesthesia QCDR form completed.        

## 2018-11-06 NOTE — H&P (Signed)
Reviewed paper H+P, will be scanned into chart. No changes noted.  

## 2018-11-07 LAB — BASIC METABOLIC PANEL
Anion gap: 9 (ref 5–15)
BUN: 16 mg/dL (ref 6–20)
CO2: 25 mmol/L (ref 22–32)
Calcium: 8.3 mg/dL — ABNORMAL LOW (ref 8.9–10.3)
Chloride: 102 mmol/L (ref 98–111)
Creatinine, Ser: 0.62 mg/dL (ref 0.44–1.00)
GFR calc Af Amer: >60 mL/min (ref >60)
GFR calc non Af Amer: >60 mL/min (ref >60)
Glucose, Bld: 124 mg/dL — ABNORMAL HIGH (ref 70–99)
Potassium: 3.6 mmol/L (ref 3.5–5.1)
Sodium: 136 mmol/L (ref 135–145)

## 2018-11-07 LAB — CBC
HCT: 31.2 % — ABNORMAL LOW (ref 36.0–46.0)
Hemoglobin: 9.7 g/dL — ABNORMAL LOW (ref 12.0–15.0)
MCH: 25.4 pg — ABNORMAL LOW (ref 26.0–34.0)
MCHC: 31.1 g/dL (ref 30.0–36.0)
MCV: 81.7 fL (ref 80.0–100.0)
Platelets: 229 10*3/uL (ref 150–400)
RBC: 3.82 MIL/uL — ABNORMAL LOW (ref 3.87–5.11)
RDW: 15 % (ref 11.5–15.5)
WBC: 8.5 10*3/uL (ref 4.0–10.5)
nRBC: 0 % (ref 0.0–0.2)

## 2018-11-07 MED ORDER — ENOXAPARIN SODIUM 40 MG/0.4ML ~~LOC~~ SOLN: 40.0000 mg | Freq: Two times a day (BID) | SUBCUTANEOUS | 0 refills | Status: DC

## 2018-11-07 MED ORDER — OXYCODONE HCL 5 MG PO TABS: 5.0000 mg | ORAL_TABLET | ORAL | 0 refills | Status: DC | PRN

## 2018-11-07 MED ORDER — DOCUSATE SODIUM 100 MG PO CAPS: 100.0000 mg | ORAL_CAPSULE | Freq: Two times a day (BID) | ORAL | 0 refills | Status: DC

## 2018-11-07 MED ORDER — ENOXAPARIN SODIUM 40 MG/0.4ML ~~LOC~~ SOLN
40.0000 mg | Freq: Two times a day (BID) | SUBCUTANEOUS | Status: DC
  Administered 2018-11-07 – 2018-11-08 (×3): 40 mg via SUBCUTANEOUS
  Filled 2018-11-07 (×3): qty 0.4

## 2018-11-07 MED ORDER — FE FUMARATE-B12-VIT C-FA-IFC PO CAPS
1.0000 | ORAL_CAPSULE | Freq: Two times a day (BID) | ORAL | Status: DC
  Administered 2018-11-07 – 2018-11-08 (×3): 1 via ORAL
  Filled 2018-11-07 (×4): qty 1

## 2018-11-07 NOTE — Progress Notes (Signed)
   Subjective: 1 Day Post-Op Procedure(s) (LRB): RIGHT TOTAL KNEE ARTHROPLASTY (Right) APPLICATION OF WOUND VAC (Right) Patient reports pain as 2 on 0-10 scale.   Patient is well, and has had no acute complaints or problems Denies any CP, SOB, ABD pain. We will continue therapy today.   Objective: Vital signs in last 24 hours: Temp:  [97.5 F (36.4 C)-98.6 F (37 C)] 98.6 F (37 C) (07/17 0733) Pulse Rate:  [57-71] 71 (07/17 0733) Resp:  [18-21] 20 (07/17 0733) BP: (117-153)/(69-82) 152/79 (07/17 0733) SpO2:  [96 %-100 %] 96 % (07/17 0733) Weight:  [176.4 kg] 176.4 kg (07/16 1158)  Intake/Output from previous day: 07/16 0701 - 07/17 0700 In: 2174.9 [P.O.:725; I.V.:1300; IV Piggyback:149.9] Out: 575 [Urine:225; Drains:250; Blood:100] Intake/Output this shift: No intake/output data recorded.  Recent Labs    11/06/18 1229 11/07/18 0355  HGB 11.3* 9.7*   Recent Labs    11/06/18 1229 11/07/18 0355  WBC 10.4 8.5  RBC 4.40 3.82*  HCT 36.8 31.2*  PLT 265 229   Recent Labs    11/06/18 1229 11/07/18 0355  NA  --  136  K  --  3.6  CL  --  102  CO2  --  25  BUN  --  16  CREATININE 0.62 0.62  GLUCOSE  --  124*  CALCIUM  --  8.3*   No results for input(s): LABPT, INR in the last 72 hours.  EXAM General - Patient is Alert, Appropriate and Oriented Extremity - Neurovascular intact Sensation intact distally Intact pulses distally Dorsiflexion/Plantar flexion intact No cellulitis present Compartment soft Dressing - prevena intact with 325 cc drainage Motor Function - intact, moving foot and toes well on exam.   Past Medical History:  Diagnosis Date  . Appendicitis 08/16/2016  . Arthritis   . Atrial tachycardia, paroxysmal ([facility])    a. s/p ablation in 2009 by Dr. Elonda Husky in [facility] (falied); b. Managed w/ beta blocker.  Marland Kitchen GERD (gastroesophageal reflux disease)   . Hypertension   . LBBB (left bundle branch block)   . Obesity   . Sleep apnea    cpap     Assessment/Plan:   1 Day Post-Op Procedure(s) (LRB): RIGHT TOTAL KNEE ARTHROPLASTY (Right) APPLICATION OF WOUND VAC (Right) Active Problems:   Status post total knee replacement using cement, right  Estimated body mass index is 68.87 kg/m as calculated from the following:   Height as of this encounter: 5\' 3"  (1.6 m).   Weight as of this encounter: 176.4 kg. Advance diet Up with therapy  Needs BM Pain well controlled VSS Acute post op blood loss anemia - start Iron supplement Recheck labs in the am CM to assist with discharge   DVT Prophylaxis - Lovenox, Foot Pumps and TED hose Weight-Bearing as tolerated to right leg   T. Rachelle Hora, PA-C [place] 11/07/2018, 7:58 AM

## 2018-11-07 NOTE — Progress Notes (Signed)
Physical Therapy Treatment Patient Details Name: [name] [name] MRN: [id number] DOB: [birth year] Today's Date: 11/07/2018    History of Present Illness Pt admitted s/p r TKA.  PMH includes obesity, LBBB and atrial tachycardia.    PT Comments    Pt able to progress ambulation to 180 ft with good use of RW and needing 5 standing rest breaks.  Pt able to navigate room safely and manage RW with fewer VC's this afternoon.  She completed there ex with manual assistance for higher level exercises and with report of minimal pain increase.  Pt reported 5/10 pain following activity.  She was able to achieve 0-60 degrees of knee extension/flexion during functional activity.  Review provided of WB status due to some hesitancy but pt eventually stated that walking helped her knee to feel better. Pt will continue to benefit from skilled PT with focus on safe functional mobility, pain management, tolerance to activity and knee ROM.  Follow Up Recommendations  Home health PT;Supervision - Intermittent     Equipment Recommendations  Rolling walker with 5" wheels    Recommendations for Other Services       Precautions / Restrictions Precautions Precautions: Fall Restrictions Weight Bearing Restrictions: Yes RLE Weight Bearing: Weight bearing as tolerated    Mobility  Bed Mobility Overal bed mobility: Needs Assistance Bed Mobility: Sit to Supine     Supine to sit: Supervision Sit to supine: Min assist   General bed mobility comments: Pt requested assistance to bring L LE over EOB but very little effort needed from therapist to complete bed mobility.  Transfers Overall transfer level: Needs assistance Equipment used: Rolling walker (2 wheeled) Transfers: Sit to/from Stand Sit to Stand: Supervision         General transfer comment: Pt able to rise without exceptional effort.  Very little use of RW.  Ambulation/Gait   Gait Distance (Feet): 180 Feet Assistive device: Rolling walker (2  wheeled)     Gait velocity interpretation: 1.31 - 2.62 ft/sec, indicative of limited community ambulator General Gait Details: Low foot clearance and step length.  Pt required 4-5 rest breaks lasting 10-20 sec each but was able to maintain a consistent pattern.   Stairs             Wheelchair Mobility    Modified Rankin (Stroke Patients Only)       Balance Overall balance assessment: Modified Independent(Pt able to stand at sink and perform ADL without UE support.)                                          Cognition Arousal/Alertness: Awake/alert Behavior During Therapy: WFL for tasks assessed/performed Overall Cognitive Status: Within Functional Limits for tasks assessed                                 General Comments: Follows commands consistently.  Very motivated to participate with therapy.      Exercises Total Joint Exercises Ankle Circles/Pumps: 10 reps;Supine;Both Quad Sets: Right;10 reps;Supine Towel Squeeze: Both;10 reps;Supine Short Arc Quad: AAROM;Right;10 reps;Supine Heel Slides: AAROM;Right;10 reps;Supine(Partial with report of mild pain increase.) Hip ABduction/ADduction: AAROM;Right;10 reps;Supine Straight Leg Raises: AAROM;Right;10 reps;Supine Other Exercises Other Exercises: Review of HEP and time to answer questions about what to expect with therapy pending discharge. x4 min Other Exercises: Pt educated in falls prevention  strategies, safe use of AE for ADL tasks, pet mgt, compression stocking mgt, and polar care mgt. Handout provided. Other Exercises: Pt assisted to sink for oral care on this date. Completed oral care independently while standing at sink. OT provided supervision for safety.    General Comments General comments (skin integrity, edema, etc.): Wound vac and polar care in place at start/end of session.      Pertinent Vitals/Pain Pain Assessment: 0-10 Pain Score: 5  Pain Location: R knee Pain  Intervention(s): Limited activity within patient's tolerance;Monitored during session;Premedicated before session    Home Living                      Prior Function            PT Goals (current goals can now be found in the care plan section) Acute Rehab PT Goals Patient Stated Goal: To return to general daily activity and to start and exercise program to lose weight.  Pt has a hiking trail near her house which she is eager to start using. PT Goal Formulation: With patient Time For Goal Achievement: 12/05/18 Potential to Achieve Goals: Good Progress towards PT goals: Progressing toward goals    Frequency    BID      PT Plan Current plan remains appropriate    Co-evaluation              AM-PAC PT "6 Clicks" Mobility   Outcome Measure  Help needed turning from your back to your side while in a flat bed without using bedrails?: A Little Help needed moving from lying on your back to sitting on the side of a flat bed without using bedrails?: A Little Help needed moving to and from a bed to a chair (including a wheelchair)?: A Little Help needed standing up from a chair using your arms (e.g., wheelchair or bedside chair)?: A Little Help needed to walk in hospital room?: A Little Help needed climbing 3-5 steps with a railing? : A Lot 6 Click Score: 17    End of Session Equipment Utilized During Treatment: Gait belt Activity Tolerance: Patient tolerated treatment well Patient left: in bed;with call bell/phone within reach;with bed alarm set(OT notified to place alarm in chair immediately following PT treatment.)   PT Visit Diagnosis: Unsteadiness on feet (R26.81);Muscle weakness (generalized) (M62.81);Pain Pain - Right/Left: Right Pain - part of body: Knee     Time: 1352-1415 PT Time Calculation (min) (ACUTE ONLY): 23 min  Charges:  $Therapeutic Exercise: 23-37 mins                     Glenetta HewSarah Kienan Doublin, PT, DPT    Glenetta HewSarah Teighan Aubert 11/07/2018, 3:29 PM

## 2018-11-07 NOTE — Anesthesia Postprocedure Evaluation (Signed)
Anesthesia Post Note  Patient: Teaching laboratory technician  Procedure(s) Performed: RIGHT TOTAL KNEE ARTHROPLASTY (Right Knee) APPLICATION OF WOUND VAC (Right Knee)  Patient location during evaluation: Nursing Unit Anesthesia Type: Spinal Level of consciousness: oriented and awake and alert Pain management: pain level controlled Vital Signs Assessment: post-procedure vital signs reviewed and stable Respiratory status: spontaneous breathing and respiratory function stable Cardiovascular status: blood pressure returned to baseline and stable Postop Assessment: no headache, no backache, no apparent nausea or vomiting and patient able to bend at knees Anesthetic complications: no     Last Vitals:  Vitals:   11/06/18 1540 11/07/18 0733  BP: (!) 153/82 (!) 152/79  Pulse: 67 71  Resp: 20 20  Temp:  37 C  SpO2: 100% 96%    Last Pain:  Vitals:   11/07/18 0733  TempSrc: Oral  PainSc:                  Alison Stalling

## 2018-11-07 NOTE — Discharge Summary (Signed)
Physician Discharge Summary  Patient ID: [name] [name] MRN: [id number] DOB/AGE: May 30, 1970 48 y.o.  Admit date: 11/06/2018 Discharge date: 11/08/2018 Admission Diagnoses:  PRIMARY OSTEOARTHRITIS OF RIGHT KNEE   Discharge Diagnoses: Patient Active Problem List   Diagnosis Date Noted  . Status post total knee replacement using cement, right 11/06/2018  . Sprain of ankle 11/20/2017  . Acute left-sided back pain 09/19/2017  . Encounter for biometric screening 09/19/2017  . Gastroesophageal reflux disease without esophagitis 09/19/2017  . Cyst of right ovary 07/03/2017  . DUB (dysfunctional uterine bleeding) 07/03/2017  . Vaginal mass 07/03/2017  . Osteoarthritis of knee 09/19/2016  . Sleep apnea   . Appendicitis, acute 08/16/2016  . Pain in joint, lower leg 01/12/2014  . Obesity   . Hypertension   . Atrial tachycardia, paroxysmal ([facility])   . LBBB (left bundle branch block)     Past Medical History:  Diagnosis Date  . Appendicitis 08/16/2016  . Arthritis   . Atrial tachycardia, paroxysmal ([facility])    a. s/p ablation in 2009 by Dr. Elonda Husky in [facility] (falied); b. Managed w/ beta blocker.  Marland Kitchen GERD (gastroesophageal reflux disease)   . Hypertension   . LBBB (left bundle branch block)   . Obesity   . Sleep apnea    cpap     Transfusion: none   Consultants (if any):   Discharged Condition: Improved  Hospital Course: [name] [name] is an 48 y.o. female who was admitted 11/06/2018 with a diagnosis of right knee osteoarthritis and went to the operating room on 11/06/2018 and underwent the above named procedures.    Surgeries: Procedure(s): RIGHT TOTAL KNEE ARTHROPLASTY APPLICATION OF WOUND VAC on 11/06/2018 Patient tolerated the surgery well. Taken to PACU where she was stabilized and then transferred to the orthopedic floor.  Started on Lovenox 40 mg  q 12 hrs. Foot pumps applied bilaterally at 80 mm. Heels elevated on bed with rolled towels. No evidence of DVT. Negative  Homan. Physical therapy started on day #1 for gait training and transfer. OT started day #1 for ADL and assisted devices.  Patient's foley was d/c on day #1. Patient's IV  was d/c on day #2.  On post op day #2 patient was stable and ready for discharge to home with HHPT.  Implants: Medacta GMK sphere 3+ femur, 2 right tibia with short stem and 10 mm insert and 2 patella all components cemented  She was given perioperative antibiotics:  Anti-infectives (From admission, onward)   Start     Dose/Rate Route Frequency Ordered Stop   11/06/18 1400  ceFAZolin (ANCEF) 3 g in dextrose 5 % 50 mL IVPB     3 g 100 mL/hr over 30 Minutes Intravenous Every 6 hours 11/06/18 1147 11/07/18 0247   11/06/18 0942  vancomycin (VANCOCIN) powder  Status:  Discontinued       As needed 11/06/18 0942 11/06/18 1035   11/06/18 0600  ceFAZolin (ANCEF) 3 g in dextrose 5 % 50 mL IVPB     3 g 100 mL/hr over 30 Minutes Intravenous  Once 11/06/18 0559 11/06/18 0807    .  She was given sequential compression devices, early ambulation, and Lovenox Ace wrap's for DVT prophylaxis.  She benefited maximally from the hospital stay and there were no complications.    Recent vital signs:  Vitals:   11/07/18 1622 11/07/18 2308  BP: (!) 183/87 (!) 166/81  Pulse: 77 88  Resp: 20   Temp: 97.9 F (36.6 C)   SpO2: 96%  96%    Recent laboratory studies:  Lab Results  Component Value Date   HGB 10.3 (L) 11/08/2018   HGB 9.7 (L) 11/07/2018   HGB 11.3 (L) 11/06/2018   Lab Results  Component Value Date   WBC 9.6 11/08/2018   PLT 249 11/08/2018   Lab Results  Component Value Date   INR 1.1 10/30/2018   Lab Results  Component Value Date   NA 134 (L) 11/08/2018   K 3.5 11/08/2018   CL 100 11/08/2018   CO2 25 11/08/2018   BUN 11 11/08/2018   CREATININE 0.59 11/08/2018   GLUCOSE 134 (H) 11/08/2018    Discharge Medications:   Allergies as of 11/08/2018      Reactions   Aspirin Swelling      Medication List     STOP taking these medications   meloxicam 15 MG tablet Commonly known as: MOBIC     TAKE these medications   acetaminophen 500 MG tablet Commonly known as: TYLENOL Take 1,000 mg by mouth every 6 (six) hours as needed for moderate pain or headache.   carvedilol 12.5 MG tablet Commonly known as: COREG Take 1 tablet (12.5 mg total) by mouth 2 (two) times daily.   docusate sodium 100 MG capsule Commonly known as: COLACE Take 1 capsule (100 mg total) by mouth 2 (two) times daily.   enoxaparin 40 MG/0.4ML injection Commonly known as: LOVENOX Inject 0.4 mLs (40 mg total) into the skin every 12 (twelve) hours for 14 days.   EQ MULTIVITAMINS ADULT GUMMY PO Take 2 each by mouth daily.   oxyCODONE 5 MG immediate release tablet Commonly known as: Oxy IR/ROXICODONE Take 1-2 tablets (5-10 mg total) by mouth every 4 (four) hours as needed for moderate pain (pain score 4-6).   pantoprazole 40 MG tablet Commonly known as: PROTONIX Take 40 mg by mouth daily.            Durable Medical Equipment  (From admission, onward)         Start     Ordered   11/06/18 1720  For home use only DME Bedside commode  Once    Comments: bariatric  Question:  Patient needs a bedside commode to treat with the following condition  Answer:  Total knee replacement status   11/06/18 1719   11/06/18 1148  DME Walker rolling  Once    Question:  Patient needs a walker to treat with the following condition  Answer:  Status post total knee replacement using cement, right   11/06/18 1147   11/06/18 1148  DME 3 n 1  Once     11/06/18 1147   11/06/18 1148  DME Bedside commode  Once    Question:  Patient needs a bedside commode to treat with the following condition  Answer:  Status post total knee replacement using cement, right   11/06/18 1147          Diagnostic Studies: Dg Knee 1-2 Views Right  Result Date: 11/06/2018 CLINICAL DATA:  Postop day 0 RIGHT total knee arthroplasty. EXAM: RIGHT KNEE -  1-2 VIEW COMPARISON:  None. FINDINGS: Anatomic alignment post RIGHT total knee arthroplasty. No acute complicating features. Antibiotic beads are present at the surgical site. IMPRESSION: Anatomic alignment post RIGHT total knee arthroplasty without acute complicating features. Electronically Signed   By: Hulan Saashomas  Lawrence M.D.   On: 11/06/2018 11:26    Disposition: Discharge disposition: 01-Home or Self Care         Follow-up Information  Evon SlackGaines, Thomas C, PA-C Follow up in 2 week(s).   Specialties: Orthopedic Surgery, Emergency Medicine Contact information: [address] [place] [address] [phone]            Signed: Dedra SkeensMUNDY, Shameria Trimarco 11/08/2018, 6:25 AM

## 2018-11-07 NOTE — Discharge Instructions (Signed)

## 2018-11-07 NOTE — Progress Notes (Signed)
Physical Therapy Treatment Patient Details Name: [name] [name] MRN: [id number] DOB: [birth year] Today's Date: 11/07/2018    History of Present Illness Pt admitted s/p r TKA.  PMH includes obesity, LBBB and atrial tachycardia.    PT Comments    Pt able to progress to ambulating 40 ft in room with RW today with little difficulty.  She required CGA for all activity and also performed toileting and ADL's at sink with little need for UE support.  Pt is aware of WB status and is able to perform wt shift to R side with minimal report of pain increase.  Pt reported 5/10 pain at end of treatment.  She was able to perform supine there ex with manual and verbal cues to initiate only.  Pt will continue to benefit from skilled PT with focus on strength, safe functional mobility, pain management and HEP.   Follow Up Recommendations  Home health PT;Supervision - Intermittent     Equipment Recommendations  Rolling walker with 5" wheels    Recommendations for Other Services       Precautions / Restrictions Precautions Precautions: Fall Restrictions Weight Bearing Restrictions: Yes RLE Weight Bearing: Weight bearing as tolerated    Mobility  Bed Mobility Overal bed mobility: Needs Assistance Bed Mobility: Supine to Sit     Supine to sit: Supervision     General bed mobility comments: VC's for body mechanics and sequencing.  Pt able to get to EOB with use of handrail.  Transfers Overall transfer level: Needs assistance Equipment used: Rolling walker (2 wheeled) Transfers: Sit to/from UGI CorporationStand;Stand Pivot Transfers Sit to Stand: Min guard Stand pivot transfers: Min guard       General transfer comment: VC's for hand placement and use of RW.  Able to stand on first attempt.  Education regarding WB status received well.  Ambulation/Gait Ambulation/Gait assistance: Min guard Gait Distance (Feet): 40 Feet Assistive device: Rolling walker (2 wheeled)     Gait velocity interpretation: <1.8  ft/sec, indicate of risk for recurrent falls General Gait Details: Low foot clearance, decreased step length on L LE and stance time on R LE with antalgic gait.  Pt is slow to maneuver around objects in room but able to do so without assistance.   Stairs             Wheelchair Mobility    Modified Rankin (Stroke Patients Only)       Balance Overall balance assessment: Modified Independent(Pt able to stand at sink and perform ADL without UE support.)                                          Cognition Arousal/Alertness: Awake/alert Behavior During Therapy: WFL for tasks assessed/performed Overall Cognitive Status: Within Functional Limits for tasks assessed                                 General Comments: Follows commands consistently.  Very motivated to participate with therapy.      Exercises Total Joint Exercises Quad Sets: Right;10 reps;Supine(Manual cues to initiate.) Other Exercises Other Exercises: [facility], toileting and ADL's at sink. x10 min  Pt is CGA for all activity and requires heavy VC's for management of RW.    General Comments        Pertinent Vitals/Pain Pain Assessment: 0-10  Pain Score: 5     Home Living                      Prior Function            PT Goals (current goals can now be found in the care plan section) Acute Rehab PT Goals Patient Stated Goal: To return to general daily activity and to start and exercise program to lose weight.  Pt has a hiking trail near her house which she is eager to start using. PT Goal Formulation: With patient Time For Goal Achievement: 12/05/18 Potential to Achieve Goals: Good Progress towards PT goals: Progressing toward goals    Frequency    BID      PT Plan Current plan remains appropriate    Co-evaluation              AM-PAC PT "6 Clicks" Mobility   Outcome Measure  Help needed turning from your back to your side  while in a flat bed without using bedrails?: A Little Help needed moving from lying on your back to sitting on the side of a flat bed without using bedrails?: A Little Help needed moving to and from a bed to a chair (including a wheelchair)?: A Little Help needed standing up from a chair using your arms (e.g., wheelchair or bedside chair)?: A Little Help needed to walk in hospital room?: A Little Help needed climbing 3-5 steps with a railing? : A Lot 6 Click Score: 17    End of Session Equipment Utilized During Treatment: Gait belt Activity Tolerance: Patient tolerated treatment well Patient left: in chair;with call bell/phone within reach(OT notified to place alarm in chair immediately following PT treatment.)   PT Visit Diagnosis: Unsteadiness on feet (R26.81);Muscle weakness (generalized) (M62.81);Pain Pain - Right/Left: Right Pain - part of body: Knee     Time: 5041-3643 PT Time Calculation (min) (ACUTE ONLY): 27 min  Charges:  $Therapeutic Exercise: 8-22 mins $Therapeutic Activity: 8-22 mins                     Roxanne Gates, PT, DPT    Roxanne Gates 11/07/2018, 9:16 AM

## 2018-11-07 NOTE — Evaluation (Signed)
Occupational Therapy Evaluation Patient Details Name: [name] [name] MRN: [id number] DOB: October 27, 1970 Today's Date: 11/07/2018    History of Present Illness Pt admitted s/p r TKA.  PMH includes obesity, LBBB and atrial tachycardia.   Clinical Impression   [name] [name] was seen for OT evaluation this date, POD#1 from above surgery. Pt was independent in all ADLs prior to surgery. Pt is eager to return to PLOF with less pain and improved safety and independence. Hopes this surgery will allow her to become more active and utilize walking trails near her home. Pt currently requires minimal to moderate assist for LB dressing while in seated position due to pain and limited AROM of R knee. Pt instructed in polar care mgt, falls prevention strategies, home/routines modifications, DME/AE for LB bathing and dressing tasks, and compression stocking mgt. Handout provided. Pt would benefit from skilled OT services including additional instruction in dressing techniques with or without assistive devices for dressing and bathing skills to support recall and carryover prior to discharge and ultimately to maximize safety, independence, and minimize falls risk and caregiver burden. Do not currently anticipate any OT needs following this hospitalization.       Follow Up Recommendations  No OT follow up    Equipment Recommendations  3 in 1 bedside commode(Bari-BSC)    Recommendations for Other Services       Precautions / Restrictions Precautions Precautions: Fall Restrictions Weight Bearing Restrictions: Yes RLE Weight Bearing: Weight bearing as tolerated      Mobility Bed Mobility Overal bed mobility: Needs Assistance Bed Mobility: Supine to Sit     Supine to sit: Supervision     General bed mobility comments: VC's for body mechanics and sequencing.  Pt able to get to EOB with use of handrail.  Transfers Overall transfer level: Needs assistance Equipment used: Rolling walker (2  wheeled) Transfers: Sit to/from Stand Sit to Stand: Supervision Stand pivot transfers: Min guard       General transfer comment: Pt demonstrated good safety awareness and hand placement during STS transfers on this date. Overall doing very well using RW for amb within room. OT assisted with mgt of lines and leads, otherwise pt required supervision for functional mobility.    Balance Overall balance assessment: Modified Independent                                         ADL either performed or assessed with clinical judgement   ADL Overall ADL's : Needs assistance/impaired Eating/Feeding: Sitting;Independent   Grooming: Oral care;Standing;Supervision/safety   Upper Body Bathing: Sitting;Supervision/ safety   Lower Body Bathing: Minimal assistance;Moderate assistance;Sit to/from stand;With adaptive equipment   Upper Body Dressing : Independent;Sitting   Lower Body Dressing: Moderate assistance;Sit to/from stand   Toilet Transfer: Customer service manager Details (indicate cue type and reason): Pt able to ambulate distance to room toilet with CGA to supervision, but would benefit from riser or BSC over regular toilet. Toileting- Water quality scientist and Hygiene: Min guard;Set up;Sit to/from stand       Functional mobility during ADLs: Min guard;Supervision/safety General ADL Comments: Pt overall at or near baseling for upper-body ADL tasks. Is doing very well with LB ADL, but limited by decreased ROM/pain in her R knee. Return demonstrated use of sock-aid on this date with min VCs for technique.     Vision Baseline Vision/History: Wears glasses(contacts) Wears Glasses: At all times  Patient Visual Report: No change from baseline       Perception     Praxis      Pertinent Vitals/Pain Pain Assessment: 0-10 Pain Score: 5  Faces Pain Scale: Hurts a little bit Pain Location: knee right incision site with  AMB. Pain Descriptors / Indicators: Sharp;Burning Pain Intervention(s): Limited activity within patient's tolerance;Monitored during session;Repositioned     Hand Dominance Right   Extremity/Trunk Assessment Upper Extremity Assessment Upper Extremity Assessment: Overall WFL for tasks assessed   Lower Extremity Assessment Lower Extremity Assessment: RLE deficits/detail;Defer to PT evaluation RLE Deficits / Details: s/p R TKA RLE Coordination: decreased gross motor       Communication Communication Communication: No difficulties   Cognition Arousal/Alertness: Awake/alert Behavior During Therapy: WFL for tasks assessed/performed Overall Cognitive Status: Within Functional Limits for tasks assessed                                 General Comments: Follows commands consistently.  Very motivated to participate with therapy.   General Comments  Wound vac and polar care in place at start/end of session.    Exercises Total Joint Exercises Quad Sets: Right;10 reps;Supine(Manual cues to initiate.) Other Exercises Other Exercises: [facility], toileting and ADL's at sink. x10 min  Pt is CGA for all activity and requires heavy VC's for management of RW. Other Exercises: Pt educated in falls prevention strategies, safe use of AE for ADL tasks, pet mgt, compression stocking mgt, and polar care mgt. Handout provided. Other Exercises: Pt assisted to sink for oral care on this date. Completed oral care independently while standing at sink. OT provided supervision for safety.   Shoulder Instructions      Home Living Family/patient expects to be discharged to:: Private residence Living Arrangements: Spouse/significant other Available Help at Discharge: Family Type of Home: House Home Access: Stairs to enter Secretary/administratorntrance Stairs-Number of Steps: 4 Entrance Stairs-Rails: Right;Left Home Layout: One level     Bathroom Shower/Tub: Walk-in Administrator, artsshower;Curtain    Bathroom Toilet: Standard     Home Equipment: None          Prior Functioning/Environment Level of Independence: Independent        Comments: Independent for ADL tasks. Endorses working and driving as well as community mobility when not limited by COVID-19 stay-at-home restrictions.        OT Problem List: Decreased coordination;Obesity;Decreased range of motion;Decreased activity tolerance;Decreased knowledge of use of DME or AE      OT Treatment/Interventions: Self-care/ADL training;Therapeutic activities;Therapeutic exercise;DME and/or AE instruction;Patient/family education    OT Goals(Current goals can be found in the care plan section) Acute Rehab OT Goals Patient Stated Goal: To return to general daily activity and to start and exercise program to lose weight.  Pt has a hiking trail near her house which she is eager to start using. OT Goal Formulation: With patient Time For Goal Achievement: 11/21/18 Potential to Achieve Goals: Good  OT Frequency: Min 1X/week   Barriers to D/C:            Co-evaluation              AM-PAC OT "6 Clicks" Daily Activity     Outcome Measure Help from another person eating meals?: None Help from another person taking care of personal grooming?: None Help from another person toileting, which includes using toliet, bedpan, or urinal?: A Little Help from another person bathing (  including washing, rinsing, drying)?: A Lot Help from another person to put on and taking off regular upper body clothing?: None Help from another person to put on and taking off regular lower body clothing?: A Lot 6 Click Score: 19   End of Session Equipment Utilized During Treatment: Gait belt;Rolling walker  Activity Tolerance: Patient tolerated treatment well Patient left: in chair;with chair alarm set;with call bell/phone within reach;with SCD's reapplied  OT Visit Diagnosis: Other abnormalities of gait and mobility (R26.89);Pain Pain -  Right/Left: Right Pain - part of body: Knee                Time: [id number] OT Time Calculation (min): 39 min Charges:  OT General Charges $OT Visit: 1 Visit OT Evaluation $OT Eval Low Complexity: 1 Low OT Treatments $Self Care/Home Management : 23-37 mins  Rockney GheeSerenity Johnel Yielding, M.S., OTR/L Ascom: [phone] 11/07/18, 11:41 AM

## 2018-11-07 NOTE — Progress Notes (Signed)
Pharmacy Lovenox Dosing  48 y.o. female admitted with No chief complaint on file. . Patient ordered Lovenox 30 mg BID for VTE prophylaxis.   Filed Weights   11/06/18 1158  Weight: (!) 388 lb 12.8 oz (176.4 kg)    Body mass index is 68.87 kg/m.  Estimated Creatinine Clearance: 135.5 mL/min (by C-G formula based on SCr of 0.62 mg/dL).  Will adjust Lovenox dosing to 40 mg BID.    [name] [name] A Darrelle Barrell 11/07/2018 8:07 AM

## 2018-11-08 LAB — CBC
HCT: 33.1 % — ABNORMAL LOW (ref 36.0–46.0)
Hemoglobin: 10.3 g/dL — ABNORMAL LOW (ref 12.0–15.0)
MCH: 25.7 pg — ABNORMAL LOW (ref 26.0–34.0)
MCHC: 31.1 g/dL (ref 30.0–36.0)
MCV: 82.5 fL (ref 80.0–100.0)
Platelets: 249 10*3/uL (ref 150–400)
RBC: 4.01 MIL/uL (ref 3.87–5.11)
RDW: 15 % (ref 11.5–15.5)
WBC: 9.6 10*3/uL (ref 4.0–10.5)
nRBC: 0 % (ref 0.0–0.2)

## 2018-11-08 LAB — BASIC METABOLIC PANEL
Anion gap: 9 (ref 5–15)
BUN: 11 mg/dL (ref 6–20)
CO2: 25 mmol/L (ref 22–32)
Calcium: 8.5 mg/dL — ABNORMAL LOW (ref 8.9–10.3)
Chloride: 100 mmol/L (ref 98–111)
Creatinine, Ser: 0.59 mg/dL (ref 0.44–1.00)
GFR calc Af Amer: >60 mL/min (ref >60)
GFR calc non Af Amer: >60 mL/min (ref >60)
Glucose, Bld: 134 mg/dL — ABNORMAL HIGH (ref 70–99)
Potassium: 3.5 mmol/L (ref 3.5–5.1)
Sodium: 134 mmol/L — ABNORMAL LOW (ref 135–145)

## 2018-11-08 MED ORDER — FLEET ENEMA 7-19 GM/118ML RE ENEM
1.0000 | ENEMA | Freq: Once | RECTAL | Status: AC
  Administered 2018-11-08: 1 via RECTAL

## 2018-11-08 NOTE — TOC Transition Note (Signed)
Transition of Care [facility]) - CM/SW Discharge Note   Patient Details  Name: [name] [name] MRN: [id number] Date of Birth: [birth year]  Transition of [facility]) CM/SW Contact:  Latanya Maudlin, RN Phone Number: 11/08/2018, 8:29 AM   Clinical Narrative:  Patient to be discharged per MD order. Orders in place for home health services. Previous TOC team member established home health with [facility]. Notified Helene Kelp of discharge. Brad with [employer] has been able to deliver DME per patient it has arrived at the home. Family to transport.      Final next level of care: [place] Barriers to Discharge: No Barriers Identified   Patient Goals and CMS Choice Patient states their goals for this hospitalization and ongoing recovery are:: To improve mobility CMS Medicare.gov Compare Post Acute Care list provided to:: Patient Choice offered to / list presented to : Patient  Discharge Placement                       Discharge Plan and Services In-house Referral: Clinical Social Work Discharge Planning Services: CM Consult Post Acute Care Choice: Home Health          DME Arranged: Bedside commode, Walker rolling DME Agency: [employer] Date DME Agency Contacted: 11/08/18 Time DME Agency Contacted: [phone] Representative spoke with at DME Agency: [place]: PT [place]: [facility] (formerly [employer]) Date [place]: 11/08/18 Time [place]: [phone] Representative spoke with at [place]: [place] ([place]) Interventions     Readmission Risk Interventions Readmission Risk Prevention Plan 11/08/2018  Post Dischage Appt Complete  Medication Screening Complete  Transportation Screening Complete  Some recent data might be hidden

## 2018-11-08 NOTE — Progress Notes (Signed)
Patient refusing bone foam.

## 2018-11-08 NOTE — Progress Notes (Signed)
Physical Therapy Treatment Patient Details Name: [name] [name] MRN: [id number] DOB: [birth year] Today's Date: 11/08/2018    History of Present Illness Pt admitted s/p r TKA.  PMH includes obesity, LBBB and atrial tachycardia.    PT Comments    Pt in bathroom upon arrival.  No assist with self care.  Stood and was able to ambulate to PT gym for stair training with walker and supervision/min guard.  Min guard/assist for stairs due to body size but overall does well.  Self initiated rest breaks at times in standing.  Stated she has been working on [employer] on her own and had no questions.  Anticipate discharge home today.   Follow Up Recommendations  Home health PT     Equipment Recommendations  Rolling walker with 5" wheels    Recommendations for Other Services       Precautions / Restrictions Precautions Precautions: Fall Restrictions Weight Bearing Restrictions: Yes RLE Weight Bearing: Weight bearing as tolerated    Mobility  Bed Mobility Overal bed mobility: Needs Assistance Bed Mobility: Sit to Supine       Sit to supine: Min assist   General bed mobility comments: Pt requested assistance to bring L LE over EOB but very little effort needed from therapist to complete bed mobility.  Transfers Overall transfer level: Needs assistance Equipment used: Rolling walker (2 wheeled) Transfers: Sit to/from Stand Sit to Stand: Modified independent (Device/Increase time);Supervision            Ambulation/Gait Ambulation/Gait assistance: Modified independent (Device/Increase time);Supervision Gait Distance (Feet): 150 Feet Assistive device: Rolling walker (2 wheeled) Gait Pattern/deviations: Step-to pattern;Step-through pattern Gait velocity: decreased   General Gait Details: progresses with time, fatigues quickly with self initiated standing rest breaks.   Stairs Stairs: Yes Stairs assistance: Min guard Stair Management: Step to pattern;One rail Right Number of  Stairs: 4 General stair comments: able to do without assist with some difficulty due to body size but overall does well.   Wheelchair Mobility    Modified Rankin (Stroke Patients Only)       Balance Overall balance assessment: Modified Independent                                          Cognition Arousal/Alertness: Awake/alert Behavior During Therapy: WFL for tasks assessed/performed Overall Cognitive Status: Within Functional Limits for tasks assessed                                        Exercises Other Exercises Other Exercises: Review of HEP and time to answer questions about what to expect with therapy pending discharge.    General Comments        Pertinent Vitals/Pain Pain Assessment: Faces Faces Pain Scale: Hurts a little bit Pain Location: R knee Pain Descriptors / Indicators: Sore Pain Intervention(s): Limited activity within patient's tolerance;Monitored during session;Ice applied    Home Living                      Prior Function            PT Goals (current goals can now be found in the care plan section) Progress towards PT goals: Progressing toward goals    Frequency    BID      PT Plan Current plan  remains appropriate    Co-evaluation              AM-PAC PT "6 Clicks" Mobility   Outcome Measure  Help needed turning from your back to your side while in a flat bed without using bedrails?: A Little Help needed moving from lying on your back to sitting on the side of a flat bed without using bedrails?: A Little Help needed moving to and from a bed to a chair (including a wheelchair)?: None Help needed standing up from a chair using your arms (e.g., wheelchair or bedside chair)?: None Help needed to walk in hospital room?: None Help needed climbing 3-5 steps with a railing? : A Little 6 Click Score: 21    End of Session Equipment Utilized During Treatment: Gait belt Activity Tolerance:  Patient tolerated treatment well Patient left: in bed;with call bell/phone within reach;with bed alarm set   Pain - Right/Left: Right Pain - part of body: Knee     Time: [id number] PT Time Calculation (min) (ACUTE ONLY): 28 min  Charges:  $Gait Training: 8-22 mins $Therapeutic Activity: 8-22 mins                   Danielle DessSarah Ebba Goll, PTA 11/08/18, 10:03 AM

## 2018-11-08 NOTE — Plan of Care (Signed)

## 2018-11-08 NOTE — Progress Notes (Addendum)
Patient is being discharged home this afternoon with [facility] and PT. . Husband and son here to pick her up. DC & Rx instructions given and patient acknowledged understanding. IV removed, belongings packed. NT's helped patient to dress for transport.

## 2018-11-08 NOTE — Progress Notes (Signed)
   Subjective: 2 Days Post-Op Procedure(s) (LRB): RIGHT TOTAL KNEE ARTHROPLASTY (Right) APPLICATION OF WOUND VAC (Right) Patient reports pain as 2 on 0-10 scale.   Patient is well, and has had no acute complaints or problems Denies any CP, SOB, ABD pain. We will continue therapy today.  The patient ambulated 180 feet.  Objective: Vital signs in last 24 hours: Temp:  [97.9 F (36.6 C)-98.6 F (37 C)] 97.9 F (36.6 C) (07/17 1622) Pulse Rate:  [71-88] 88 (07/17 2308) Resp:  [20] 20 (07/17 1622) BP: (152-183)/(79-87) 166/81 (07/17 2308) SpO2:  [96 %] 96 % (07/17 2308)  Intake/Output from previous day: 07/17 0701 - 07/18 0700 In: 1050.5 [P.O.:120; I.V.:930.5] Out: -  Intake/Output this shift: No intake/output data recorded.  Recent Labs    11/06/18 1229 11/07/18 0355 11/08/18 0513  HGB 11.3* 9.7* 10.3*   Recent Labs    11/07/18 0355 11/08/18 0513  WBC 8.5 9.6  RBC 3.82* 4.01  HCT 31.2* 33.1*  PLT 229 249   Recent Labs    11/07/18 0355 11/08/18 0513  NA 136 134*  K 3.6 3.5  CL 102 100  CO2 25 25  BUN 16 11  CREATININE 0.62 0.59  GLUCOSE 124* 134*  CALCIUM 8.3* 8.5*   No results for input(s): LABPT, INR in the last 72 hours.  EXAM General - Patient is Alert, Appropriate and Oriented Extremity - Neurovascular intact Sensation intact distally Intact pulses distally Dorsiflexion/Plantar flexion intact No cellulitis present Compartment soft Dressing - prevena intact with decreasing drainage Motor Function - intact, moving foot and toes well on exam.   Past Medical History:  Diagnosis Date  . Appendicitis 08/16/2016  . Arthritis   . Atrial tachycardia, paroxysmal (HCC)    a. s/p ablation in 2009 by Dr. Elonda Husky in [facility] (falied); b. Managed w/ beta blocker.  Marland Kitchen GERD (gastroesophageal reflux disease)   . Hypertension   . LBBB (left bundle branch block)   . Obesity   . Sleep apnea    cpap    Assessment/Plan:   2 Days Post-Op Procedure(s)  (LRB): RIGHT TOTAL KNEE ARTHROPLASTY (Right) APPLICATION OF WOUND VAC (Right) Active Problems:   Status post total knee replacement using cement, right  Estimated body mass index is 68.87 kg/m as calculated from the following:   Height as of this encounter: 5\' 3"  (1.6 m).   Weight as of this encounter: 176.4 kg. Advance diet Up with therapy  Needs BM Pain well controlled VSS CM to assist with discharge home.  Possible today.   DVT Prophylaxis - Lovenox, Foot Pumps and TED hose Weight-Bearing as tolerated to right leg   Reche Dixon PA-C [place] 11/08/2018, 6:20 AM

## 2018-11-10 ENCOUNTER — Other Ambulatory Visit: Payer: Self-pay | Admitting: Cardiovascular Disease

## 2018-11-10 MED ORDER — CARVEDILOL 12.5 MG PO TABS: 12.5000 mg | ORAL_TABLET | Freq: Two times a day (BID) | ORAL | 0 refills | Status: DC

## 2018-11-10 NOTE — Telephone Encounter (Signed)
Requested Prescriptions   Signed Prescriptions Disp Refills  . carvedilol (COREG) 12.5 MG tablet 180 tablet 0    Sig: Take 1 tablet (12.5 mg total) by mouth 2 (two) times daily.    Authorizing Provider: BERGE, CHRISTOPHER RONALD    Ordering User: NEWCOMER MCCLAIN, BRANDY L    

## 2018-11-10 NOTE — Telephone Encounter (Signed)
°*  STAT* If patient is at the pharmacy, call can be transferred to refill team.   1. Which medications need to be refilled? (please list name of each medication and dose if known)    Carvedilol 12.5 mg po BID     2. Which pharmacy/location (including street and city if local pharmacy) is medication to be sent to? [facility] in [place]   3. Do they need a 30 day or 90 day supply? [place]

## 2018-11-17 ENCOUNTER — Encounter: Payer: Self-pay | Admitting: Adult Health

## 2018-12-19 ENCOUNTER — Other Ambulatory Visit: Payer: Self-pay | Admitting: Orthopedic Surgery

## 2018-12-19 DIAGNOSIS — Z96651 Presence of right artificial knee joint: Secondary | ICD-10-CM

## 2018-12-22 ENCOUNTER — Telehealth: Payer: Self-pay | Admitting: Cardiovascular Disease

## 2018-12-22 NOTE — Telephone Encounter (Signed)
Virtual Visit Pre-Appointment Phone Call  "(Name), I am calling you today to discuss your upcoming appointment. We are currently trying to limit exposure to the virus that causes COVID-19 by seeing patients at home rather than in the office."  1. "What is the BEST phone number to call the day of the visit?" - include this in appointment notes  2. Do you have or have access to (through a family member/friend) a smartphone with video capability that we can use for your visit?" a. If yes - list this number in appt notes as cell (if different from BEST phone #) and list the appointment type as a VIDEO visit in appointment notes b. If no - list the appointment type as a PHONE visit in appointment notes  3. Confirm consent - "In the setting of the current Covid19 crisis, you are scheduled for a (phone or video) visit with your provider on (date) at (time).  Just as we do with many in-office visits, in order for you to participate in this visit, we must obtain consent.  If you'd like, I can send this to your mychart (if signed up) or email for you to review.  Otherwise, I can obtain your verbal consent now.  All virtual visits are billed to your insurance company just like a normal visit would be.  By agreeing to a virtual visit, we'd like you to understand that the technology does not allow for your provider to perform an examination, and thus may limit your provider's ability to fully assess your condition. If your provider identifies any concerns that need to be evaluated in person, we will make arrangements to do so.  Finally, though the technology is pretty good, we cannot assure that it will always work on either your or our end, and in the setting of a video visit, we may have to convert it to a phone-only visit.  In either situation, we cannot ensure that we have a secure connection.  Are you willing to proceed?" STAFF: Did the patient verbally acknowledge consent to telehealth visit? Document  YES/NO here: YES  4. Advise patient to be prepared - "Two hours prior to your appointment, go ahead and check your blood pressure, pulse, oxygen saturation, and your weight (if you have the equipment to check those) and write them all down. When your visit starts, your provider will ask you for this information. If you have an Apple Watch or Kardia device, please plan to have heart rate information ready on the day of your appointment. Please have a pen and paper handy nearby the day of the visit as well."  5. Give patient instructions for MyChart download to smartphone OR Doximity/Doxy.me as below if video visit (depending on what platform provider is using)  6. Inform patient they will receive a phone call 15 minutes prior to their appointment time (may be from unknown caller ID) so they should be prepared to answer    TELEPHONE CALL NOTE  [name] [name] has been deemed a candidate for a follow-up tele-health visit to limit community exposure during the Covid-19 pandemic. I spoke with the patient via phone to ensure availability of phone/video source, confirm preferred email & phone number, and discuss instructions and expectations.  I reminded [name] [name] to be prepared with any vital sign and/or heart rhythm information that could potentially be obtained via home monitoring, at the time of her visit. I reminded [name] [name] to expect a phone call prior to her visit.  [name] [name] 12/22/2018 11:13 AM   INSTRUCTIONS FOR DOWNLOADING THE MYCHART APP TO SMARTPHONE  - The patient must first make sure to have activated MyChart and know their login information - If Apple, go to [employer] and type in MyChart in the search bar and download the app. If Android, ask patient to go to [employer] and type in Kaycee in the search bar and download the app. The app is free but as with any other app downloads, their phone may require them to verify saved payment information or Apple/Android  password.  - The patient will need to then log into the app with their MyChart username and password, and select [facility] as their healthcare provider to link the account. When it is time for your visit, go to the MyChart app, find appointments, and click Begin Video Visit. Be sure to Select Allow for your device to access the Microphone and Camera for your visit. You will then be connected, and your provider will be with you shortly.  **If they have any issues connecting, or need assistance please contact MyChart service desk [phone])**  **If using a computer, in order to ensure the best quality for their visit they will need to use either of the following Internet Browsers: [employer] or Google Chrome**  IF USING DOXIMITY or DOXY.ME - The patient will receive a link just prior to their visit by text.     FULL LENGTH CONSENT FOR TELE-HEALTH VISIT   I hereby voluntarily request, consent and authorize [facility] and its employed or contracted physicians, physician assistants, nurse practitioners or other licensed health care professionals (the Practitioner), to provide me with telemedicine health care services (the Services") as deemed necessary by the treating Practitioner. I acknowledge and consent to receive the Services by the Practitioner via telemedicine. I understand that the telemedicine visit will involve communicating with the Practitioner through live audiovisual communication technology and the disclosure of certain medical information by electronic transmission. I acknowledge that I have been given the opportunity to request an in-person assessment or other available alternative prior to the telemedicine visit and am voluntarily participating in the telemedicine visit.  I understand that I have the right to withhold or withdraw my consent to the use of telemedicine in the course of my care at any time, without affecting my right to future care or treatment,  and that the Practitioner or I may terminate the telemedicine visit at any time. I understand that I have the right to inspect all information obtained and/or recorded in the course of the telemedicine visit and may receive copies of available information for a reasonable fee.  I understand that some of the potential risks of receiving the Services via telemedicine include:   Delay or interruption in medical evaluation due to technological equipment failure or disruption;  Information transmitted may not be sufficient (e.g. poor resolution of images) to allow for appropriate medical decision making by the Practitioner; and/or   In rare instances, security protocols could fail, causing a breach of personal health information.  Furthermore, I acknowledge that it is my responsibility to provide information about my medical history, conditions and care that is complete and accurate to the best of my ability. I acknowledge that Practitioner's advice, recommendations, and/or decision may be based on factors not within their control, such as incomplete or inaccurate data provided by me or distortions of diagnostic images or specimens that may result from electronic transmissions. I understand that the practice  of medicine is not an Visual merchandiserexact science and that Practitioner makes no warranties or guarantees regarding treatment outcomes. I acknowledge that I will receive a copy of this consent concurrently upon execution via email to the email address I last provided but may also request a printed copy by calling the office of [facility].    I understand that my insurance will be billed for this visit.   I have read or had this consent read to me.  I understand the contents of this consent, which adequately explains the benefits and risks of the Services being provided via telemedicine.   I have been provided ample opportunity to ask questions regarding this consent and the Services and have had my questions  answered to my satisfaction.  I give my informed consent for the services to be provided through the use of telemedicine in my medical care  By participating in this telemedicine visit I agree to the above.

## 2018-12-25 ENCOUNTER — Telehealth (INDEPENDENT_AMBULATORY_CARE_PROVIDER_SITE_OTHER): Payer: Managed Care, Other (non HMO) | Admitting: Cardiovascular Disease

## 2018-12-25 ENCOUNTER — Other Ambulatory Visit: Payer: Self-pay

## 2018-12-25 VITALS — BP 136/82 | HR 72 | Ht 63.0 in | Wt 371.0 lb

## 2018-12-25 DIAGNOSIS — I471 Supraventricular tachycardia: Secondary | ICD-10-CM | POA: Diagnosis not present

## 2018-12-25 DIAGNOSIS — I447 Left bundle-branch block, unspecified: Secondary | ICD-10-CM

## 2018-12-25 DIAGNOSIS — I10 Essential (primary) hypertension: Secondary | ICD-10-CM

## 2018-12-25 MED ORDER — CARVEDILOL 12.5 MG PO TABS: 12.5000 mg | ORAL_TABLET | Freq: Two times a day (BID) | ORAL | 3 refills | Status: DC

## 2018-12-25 NOTE — Patient Instructions (Signed)
Medication Instructions:  Continue same medications If you need a refill on your cardiac medications before your next appointment, please call your pharmacy.   Lab work: None If you have labs (blood work) drawn today and your tests are completely normal, you will receive your results only by: . MyChart Message (if you have MyChart) OR . A paper copy in the mail If you have any lab test that is abnormal or we need to change your treatment, we will call you to review the results.  Testing/Procedures: None  Follow-Up: At [facility], you and your health needs are our priority.  As part of our continuing mission to provide you with exceptional heart care, we have created designated Provider Care Teams.  These Care Teams include your primary Cardiologist (physician) and Advanced Practice Providers (APPs -  Physician Assistants and Nurse Practitioners) who all work together to provide you with the care you need, when you need it. You will need a follow up appointment in 1 years.  Please call our office 2 months in advance to schedule this appointment.  You may see Seidy Labreck, MD or one of the following Advanced Practice Providers on your designated Care Team:   Christopher Berge, NP Ryan Dunn, PA-C . Jacquelyn Visser, PA-C    

## 2018-12-25 NOTE — Progress Notes (Signed)
Virtual Visit via Video Note   This visit type was conducted due to national recommendations for restrictions regarding the COVID-19 Pandemic (e.g. social distancing) in an effort to limit this patient's exposure and mitigate transmission in our community.  Due to her co-morbid illnesses, this patient is at least at moderate risk for complications without adequate follow up.  This format is felt to be most appropriate for this patient at this time.  All issues noted in this document were discussed and addressed.  A limited physical exam was performed with this format.  Please refer to the patient's chart for her consent [facility].   Date:  12/25/2018   ID:  [name] [name], DOB [birth year], MRN [id number]  Patient Location: Home Provider Location: Office  PCP:  Olive Bassough, Robert L, MD  Cardiologist:  Lorine BearsMuhammad Daneen Volcy, MD  Electrophysiologist:  None   Evaluation Performed:  Follow-Up Visit  Chief Complaint: Doing well overall  History of Present Illness:    [name] [name] is a 48 y.o. female was seen via video visit for a follow-up regarding paroxysmal supraventricular tachycardia, left bundle branch block and essential hypertension. In 2009 she was diagnosed with atrial tachycardia and underwent unsuccessful ablation by Dr. Chales Abrahamsyson in [place]. She developed left bundle branch block after that. She has been treated with carvedilol for both palpitations and essential hypertension. She has known history of sleep apnea on CPAP.   She underwent right knee replacement recently with no complications.  She is waiting to have left knee replacement in few months.  She is planning to go back to work next week.  She has been doing well from a cardiac standpoint with no chest pain, shortness of breath or palpitations.   The patient does not have symptoms concerning for COVID-19 infection (fever, chills, cough, or new shortness of breath).    Past Medical History:  Diagnosis Date  .  Appendicitis 08/16/2016  . Arthritis   . Atrial tachycardia, paroxysmal (HCC)    a. s/p ablation in 2009 [place]); b. Managed w/ beta blocker.  Marland Kitchen. GERD (gastroesophageal reflux disease)   . Hypertension   . LBBB (left bundle branch block)   . Obesity   . Sleep apnea    cpap   Past Surgical History:  Procedure Laterality Date  . ABLATION OF DYSRHYTHMIC FOCUS  2009  . ANTERIOR CRUCIATE LIGAMENT REPAIR    . APPLICATION OF WOUND VAC Right 11/06/2018   Procedure: APPLICATION OF WOUND VAC;  Surgeon: Kennedy BuckerMenz, Michael, MD;  Location: [facility] ORS;  Service: Orthopedics;  Laterality: Right;  [id number];  [id number]  . LAPAROSCOPIC APPENDECTOMY N/A 08/16/2016   Procedure: APPENDECTOMY LAPAROSCOPIC;  Surgeon: Lattie Hawichard E Cooper, MD;  Location: [facility] ORS;  Service: General;  Laterality: N/A;  . TONSILLECTOMY    . TOTAL KNEE ARTHROPLASTY Right 11/06/2018   Procedure: RIGHT TOTAL KNEE ARTHROPLASTY;  Surgeon: Kennedy BuckerMenz, Michael, MD;  Location: [facility] ORS;  Service: Orthopedics;  Laterality: Right;     Current Meds  Medication Sig  . acetaminophen (TYLENOL) 500 MG tablet Take 1,000 mg by mouth every 6 (six) hours as needed for moderate pain or headache.  . carvedilol (COREG) 12.5 MG tablet Take 1 tablet (12.5 mg total) by mouth 2 (two) times daily.  . meloxicam (MOBIC) 15 MG tablet TAKE 1 TABLET(15 MG) BY MOUTH EVERY DAY  . Multiple Vitamins-Minerals (EQ MULTIVITAMINS ADULT GUMMY PO) Take 2 each by mouth daily.  . pantoprazole (PROTONIX) 40 MG tablet Take 40  mg by mouth daily.      Allergies:   Aspirin   Social History   Tobacco Use  . Smoking status: Never Smoker  . Smokeless tobacco: Never Used  Substance Use Topics  . Alcohol use: No  . Drug use: No     Family Hx: The patient's family history includes Hyperlipidemia in her mother; Hypertension in her father and mother.  ROS:   Please see the history of present illness.     All other systems reviewed and are negative.   Prior CV  studies:   The following studies were reviewed today:    Labs/Other Tests and Data Reviewed:    EKG:  An ECG dated 10/30/2018 was personally reviewed today and demonstrated:  Normal sinus rhythm with left bundle branch block  Recent Labs: 11/08/2018: BUN 11; Creatinine, Ser 0.59; Hemoglobin 10.3; Platelets 249; Potassium 3.5; Sodium 134   Recent Lipid Panel Lab Results  Component Value Date/Time   CHOL 167 12/07/2016 11:47 AM   TRIG 157 (H) 12/07/2016 11:47 AM   HDL 59 12/07/2016 11:47 AM   CHOLHDL 2.8 12/07/2016 11:47 AM   LDLCALC 77 12/07/2016 11:47 AM    Wt Readings from Last 3 Encounters:  12/25/18 (!) 371 lb (168.3 kg)  11/06/18 (!) 388 lb 12.8 oz (176.4 kg)  10/30/18 (!) 388 lb 12.8 oz (176.4 kg)     Objective:    Vital Signs:  BP 136/82   Pulse 72   Ht 5\' 3"  (1.6 m)   Wt (!) 371 lb (168.3 kg)   BMI 65.72 kg/m    VITAL SIGNS:  reviewed GEN:  no acute distress EYES:  sclerae anicteric, EOMI - Extraocular Movements Intact RESPIRATORY:  normal respiratory effort, symmetric expansion SKIN:  no rash, lesions or ulcers. MUSCULOSKELETAL:  no obvious deformities. NEURO:  alert and oriented x 3, no obvious focal deficit PSYCH:  normal affect  ASSESSMENT & PLAN:     1.  Paroxysmal atrial tachycardia: No evidence of recurrent arrhythmia. Continue current dose of carvedilol.  2. Essential hypertension: Blood pressure is well controlled on carvedilol.  3. Morbid obesity: She is planning to start exercising more after she gets her left knee replacement.  I strongly encouraged her to start an exercise program and follow a healthier diet.  4. Obstructive sleep apnea: Continue treatment with CPAP.   COVID-19 Education: The signs and symptoms of COVID-19 were discussed with the patient and how to seek care for testing (follow up with PCP or arrange E-visit).  The importance of social distancing was discussed today.  Time:   Today, I have spent 8 minutes with the patient  with telehealth technology discussing the above problems.     Medication Adjustments/Labs and Tests Ordered: Current medicines are reviewed at length with the patient today.  Concerns regarding medicines are outlined above.   Tests Ordered: No orders of the defined types were placed in this encounter.   Medication Changes: No orders of the defined types were placed in this encounter.   Follow Up:  In Person in 1 year(s)  Signed, Kathlyn Sacramento, MD  12/25/2018 3:44 PM    [place]

## 2018-12-26 ENCOUNTER — Telehealth: Payer: Managed Care, Other (non HMO) | Admitting: Cardiovascular Disease

## 2018-12-31 ENCOUNTER — Other Ambulatory Visit: Payer: Self-pay

## 2018-12-31 ENCOUNTER — Ambulatory Visit
Admission: RE | Admit: 2018-12-31 | Discharge: 2018-12-31 | Disposition: A | Discharge status: Home / Self Care | Payer: Managed Care, Other (non HMO) | Source: Ambulatory Visit | Attending: Orthopedic Surgery | Admitting: Orthopedic Surgery

## 2018-12-31 DIAGNOSIS — Z96651 Presence of right artificial knee joint: Secondary | ICD-10-CM | POA: Diagnosis not present

## 2019-01-19 ENCOUNTER — Encounter
Admission: RE | Admit: 2019-01-19 | Discharge: 2019-01-19 | Disposition: A | Discharge status: Home / Self Care | Payer: Managed Care, Other (non HMO) | Source: Ambulatory Visit | Attending: Orthopedic Surgery | Admitting: Orthopedic Surgery

## 2019-01-19 ENCOUNTER — Other Ambulatory Visit: Payer: Self-pay

## 2019-01-19 DIAGNOSIS — Z01812 Encounter for preprocedural laboratory examination: Secondary | ICD-10-CM | POA: Insufficient documentation

## 2019-01-19 LAB — CBC
HCT: 35.8 % — ABNORMAL LOW (ref 36.0–46.0)
Hemoglobin: 10.8 g/dL — ABNORMAL LOW (ref 12.0–15.0)
MCH: 24.1 pg — ABNORMAL LOW (ref 26.0–34.0)
MCHC: 30.2 g/dL (ref 30.0–36.0)
MCV: 79.9 fL — ABNORMAL LOW (ref 80.0–100.0)
Platelets: 301 10*3/uL (ref 150–400)
RBC: 4.48 MIL/uL (ref 3.87–5.11)
RDW: 15.1 % (ref 11.5–15.5)
WBC: 6.9 10*3/uL (ref 4.0–10.5)
nRBC: 0 % (ref 0.0–0.2)

## 2019-01-19 LAB — PROTIME-INR
INR: 1 (ref 0.8–1.2)
Prothrombin Time: 12.7 seconds (ref 11.4–15.2)

## 2019-01-19 LAB — URINALYSIS, COMPLETE (UACMP) WITH MICROSCOPIC
Bacteria, UA: NONE SEEN
Bilirubin Urine: NEGATIVE
Glucose, UA: NEGATIVE mg/dL
Hgb urine dipstick: NEGATIVE
Ketones, ur: NEGATIVE mg/dL
Leukocytes,Ua: NEGATIVE
Nitrite: NEGATIVE
Protein, ur: NEGATIVE mg/dL
Specific Gravity, Urine: 1.019 (ref 1.005–1.030)
pH: 6 (ref 5.0–8.0)

## 2019-01-19 LAB — SEDIMENTATION RATE: Sed Rate: 44 mm/hr — ABNORMAL HIGH (ref 0–30)

## 2019-01-19 LAB — BASIC METABOLIC PANEL
Anion gap: 9 (ref 5–15)
BUN: 15 mg/dL (ref 6–20)
CO2: 28 mmol/L (ref 22–32)
Calcium: 9 mg/dL (ref 8.9–10.3)
Chloride: 100 mmol/L (ref 98–111)
Creatinine, Ser: 0.57 mg/dL (ref 0.44–1.00)
GFR calc Af Amer: >60 mL/min (ref >60)
GFR calc non Af Amer: >60 mL/min (ref >60)
Glucose, Bld: 100 mg/dL — ABNORMAL HIGH (ref 70–99)
Potassium: 3.7 mmol/L (ref 3.5–5.1)
Sodium: 137 mmol/L (ref 135–145)

## 2019-01-19 LAB — SURGICAL PCR SCREEN
MRSA, PCR: NEGATIVE
Staphylococcus aureus: POSITIVE — AB

## 2019-01-19 LAB — TYPE AND SCREEN
ABO/RH(D): O POS
Antibody Screen: NEGATIVE

## 2019-01-19 LAB — APTT: aPTT: 27 seconds (ref 24–36)

## 2019-01-19 NOTE — Patient Instructions (Addendum)
Your procedure is scheduled on: 01/29/2019 Thurs Report to Same Day Surgery 2nd floor medical mall [facility] Entrance-take elevator on left to 2nd floor.  Check in with surgery information desk.) To find out your arrival time please call [phone] between 1PM - 3PM on 01/28/2019 Wed  Remember: Instructions that are not followed completely may result in serious medical risk, up to and including death, or upon the discretion of your surgeon and anesthesiologist your surgery may need to be rescheduled.    _x___ 1. Do not eat food after midnight the night before your procedure. You may drink clear liquids up to 2 hours before you are scheduled to arrive at the hospital for your procedure.  Do not drink clear liquids within 2 hours of your scheduled arrival to the hospital.  Clear liquids include  --Water or Apple juice without pulp  --Clear carbohydrate beverage such as ClearFast or Gatorade  --Black Coffee or Clear Tea (No milk, no creamers, do not add anything to                  the coffee or Tea Type 1 and type 2 diabetics should only drink water.   ____Ensure clear carbohydrate drink on the way to the hospital for bariatric patients  ____Ensure clear carbohydrate drink 3 hours before surgery.   No gum chewing or hard candies.     __x__ 2. No Alcohol for 24 hours before or after surgery.   __x__3. No Smoking or e-cigarettes for 24 prior to surgery.  Do not use any chewable tobacco products for at least 6 hour prior to surgery   ____  4. Bring all medications with you on the day of surgery if instructed.    __x__ 5. Notify your doctor if there is any change in your medical condition     (cold, fever, infections).    x___6. On the morning of surgery brush your teeth with toothpaste and water.  You may rinse your mouth with mouth wash if you wish.  Do not swallow any toothpaste or mouthwash.   Do not wear jewelry, make-up, hairpins, clips or nail polish.  Do not wear lotions,  powders, or perfumes. You may wear deodorant.  Do not shave 48 hours prior to surgery. Men may shave face and neck.  Do not bring valuables to the hospital.    [facility] is not responsible for any belongings or valuables.               Contacts, dentures or bridgework may not be worn into surgery.  Leave your suitcase in the car. After surgery it may be brought to your room.  For patients admitted to the hospital, discharge time is determined by your                       treatment team.  _  Patients discharged the day of surgery will not be allowed to drive home.  You will need someone to drive you home and stay with you the night of your procedure.    Please read over the following fact sheets that you were given:   [facility] Preparing for Surgery and or MRSA Information   _x___ Take anti-hypertensive listed below, cardiac, seizure, asthma,     anti-reflux and psychiatric medicines. These include:  1. carvedilol (COREG) 12.5 MG tablet  2.pantoprazole (PROTONIX) 40 MG tablet  3.  4.  5.  6.  ____Fleets enema or Magnesium Citrate as  directed.   _x___ Use CHG Soap or sage wipes as directed on instruction sheet   ____ Use inhalers on the day of surgery and bring to hospital day of surgery  ____ Stop Metformin and Janumet 2 days prior to surgery.    ____ Take 1/2 of usual insulin dose the night before surgery and none on the morning     surgery.   _x___ Follow recommendations from Cardiologist, Pulmonologist or PCP regarding          stopping Aspirin, Coumadin, Plavix ,Eliquis, Effient, or Pradaxa, and Pletal.  X____Stop Anti-inflammatories such as Advil, Aleve, Ibuprofen, Motrin, Naproxen, Naprosyn, Goodies powders or aspirin products. OK to take Tylenol and                          Celebrex.   _x___ Stop supplements until after surgery.  But may continue Vitamin D, Vitamin B,       and multivitamin.   _x___ Bring C-Pap to the hospital.

## 2019-01-20 LAB — URINE CULTURE

## 2019-01-26 ENCOUNTER — Other Ambulatory Visit
Admission: RE | Admit: 2019-01-26 | Discharge: 2019-01-26 | Disposition: A | Discharge status: Home / Self Care | Payer: Managed Care, Other (non HMO) | Source: Ambulatory Visit | Attending: Orthopedic Surgery | Admitting: Orthopedic Surgery

## 2019-01-26 ENCOUNTER — Other Ambulatory Visit: Payer: Self-pay

## 2019-01-26 DIAGNOSIS — Z01812 Encounter for preprocedural laboratory examination: Secondary | ICD-10-CM | POA: Insufficient documentation

## 2019-01-26 DIAGNOSIS — Z20828 Contact with and (suspected) exposure to other viral communicable diseases: Secondary | ICD-10-CM | POA: Insufficient documentation

## 2019-01-26 LAB — SARS CORONAVIRUS 2 (TAT 6-24 HRS): SARS Coronavirus 2: NEGATIVE

## 2019-01-28 MED ORDER — TRANEXAMIC ACID-NACL 1000-0.7 MG/100ML-% IV SOLN
1000.0000 mg | INTRAVENOUS | Status: AC
  Administered 2019-01-29: 10:00:00 1000 mg via INTRAVENOUS
  Filled 2019-01-28: qty 100

## 2019-01-28 MED ORDER — DEXTROSE 5 % IV SOLN
3.0000 g | Freq: Once | INTRAVENOUS | Status: AC
  Administered 2019-01-29: 3 g via INTRAVENOUS
  Filled 2019-01-28: qty 3000

## 2019-01-29 ENCOUNTER — Other Ambulatory Visit: Payer: Self-pay

## 2019-01-29 ENCOUNTER — Inpatient Hospital Stay: Payer: Managed Care, Other (non HMO) | Admitting: Certified Registered"

## 2019-01-29 ENCOUNTER — Encounter
Admission: RE | Disposition: A | Discharge status: Home Health | Payer: Self-pay | Source: Home / Self Care | Attending: Orthopedic Surgery

## 2019-01-29 ENCOUNTER — Inpatient Hospital Stay: Payer: Managed Care, Other (non HMO)

## 2019-01-29 ENCOUNTER — Encounter: Payer: Self-pay | Admitting: *Deleted

## 2019-01-29 ENCOUNTER — Inpatient Hospital Stay
Admission: RE | Admit: 2019-01-29 | Discharge: 2019-02-02 | DRG: 470 | Disposition: A | Discharge status: Home Health | Payer: Managed Care, Other (non HMO) | Attending: Orthopedic Surgery | Admitting: Orthopedic Surgery

## 2019-01-29 DIAGNOSIS — Z79899 Other long term (current) drug therapy: Secondary | ICD-10-CM

## 2019-01-29 DIAGNOSIS — M1612 Unilateral primary osteoarthritis, left hip: Secondary | ICD-10-CM | POA: Diagnosis present

## 2019-01-29 DIAGNOSIS — I10 Essential (primary) hypertension: Secondary | ICD-10-CM | POA: Diagnosis present

## 2019-01-29 DIAGNOSIS — Z886 Allergy status to analgesic agent status: Secondary | ICD-10-CM | POA: Diagnosis not present

## 2019-01-29 DIAGNOSIS — Z96642 Presence of left artificial hip joint: Secondary | ICD-10-CM

## 2019-01-29 DIAGNOSIS — G4730 Sleep apnea, unspecified: Secondary | ICD-10-CM | POA: Diagnosis present

## 2019-01-29 DIAGNOSIS — Z6841 Body Mass Index (BMI) 40.0 and over, adult: Secondary | ICD-10-CM | POA: Diagnosis not present

## 2019-01-29 DIAGNOSIS — K219 Gastro-esophageal reflux disease without esophagitis: Secondary | ICD-10-CM | POA: Diagnosis present

## 2019-01-29 DIAGNOSIS — Z791 Long term (current) use of non-steroidal anti-inflammatories (NSAID): Secondary | ICD-10-CM

## 2019-01-29 DIAGNOSIS — D62 Acute posthemorrhagic anemia: Secondary | ICD-10-CM | POA: Diagnosis not present

## 2019-01-29 DIAGNOSIS — Z419 Encounter for procedure for purposes other than remedying health state, unspecified: Secondary | ICD-10-CM

## 2019-01-29 DIAGNOSIS — Z9089 Acquired absence of other organs: Secondary | ICD-10-CM

## 2019-01-29 DIAGNOSIS — Z20828 Contact with and (suspected) exposure to other viral communicable diseases: Secondary | ICD-10-CM | POA: Diagnosis present

## 2019-01-29 DIAGNOSIS — M25752 Osteophyte, left hip: Secondary | ICD-10-CM | POA: Diagnosis present

## 2019-01-29 DIAGNOSIS — Z96651 Presence of right artificial knee joint: Secondary | ICD-10-CM | POA: Diagnosis present

## 2019-01-29 DIAGNOSIS — G8918 Other acute postprocedural pain: Secondary | ICD-10-CM

## 2019-01-29 DIAGNOSIS — Z23 Encounter for immunization: Secondary | ICD-10-CM | POA: Diagnosis not present

## 2019-01-29 DIAGNOSIS — Z9049 Acquired absence of other specified parts of digestive tract: Secondary | ICD-10-CM

## 2019-01-29 HISTORY — PX: TOTAL HIP ARTHROPLASTY: SHX124

## 2019-01-29 LAB — CBC
HCT: 36.9 % (ref 36.0–46.0)
Hemoglobin: 11.2 g/dL — ABNORMAL LOW (ref 12.0–15.0)
MCH: 24.2 pg — ABNORMAL LOW (ref 26.0–34.0)
MCHC: 30.4 g/dL (ref 30.0–36.0)
MCV: 79.7 fL — ABNORMAL LOW (ref 80.0–100.0)
Platelets: 301 10*3/uL (ref 150–400)
RBC: 4.63 MIL/uL (ref 3.87–5.11)
RDW: 15.5 % (ref 11.5–15.5)
WBC: 16.9 10*3/uL — ABNORMAL HIGH (ref 4.0–10.5)
nRBC: 0 % (ref 0.0–0.2)

## 2019-01-29 LAB — CREATININE, SERUM
Creatinine, Ser: 0.63 mg/dL (ref 0.44–1.00)
GFR calc Af Amer: >60 mL/min (ref >60)
GFR calc non Af Amer: >60 mL/min (ref >60)

## 2019-01-29 LAB — POCT PREGNANCY, URINE: Preg Test, Ur: NEGATIVE

## 2019-01-29 SURGERY — ARTHROPLASTY, HIP, TOTAL, ANTERIOR APPROACH
Anesthesia: Spinal | Site: Hip | Laterality: Left

## 2019-01-29 MED ORDER — INFLUENZA VAC SPLIT QUAD 0.5 ML IM SUSY
0.5000 mL | PREFILLED_SYRINGE | INTRAMUSCULAR | Status: AC
  Administered 2019-02-01: 0.5 mL via INTRAMUSCULAR
  Filled 2019-01-29: qty 0.5

## 2019-01-29 MED ORDER — ACETAMINOPHEN 325 MG PO TABS: 325.0000 mg | ORAL_TABLET | Freq: Four times a day (QID) | ORAL | Status: DC | PRN

## 2019-01-29 MED ORDER — BUPIVACAINE HCL (PF) 0.5 % IJ SOLN
INTRAMUSCULAR | Status: DC | PRN
  Administered 2019-01-29: 3 mL via INTRATHECAL

## 2019-01-29 MED ORDER — ONDANSETRON HCL 4 MG/2ML IJ SOLN
INTRAMUSCULAR | Status: AC
  Filled 2019-01-29: qty 2

## 2019-01-29 MED ORDER — ENOXAPARIN SODIUM 40 MG/0.4ML ~~LOC~~ SOLN
40.0000 mg | Freq: Two times a day (BID) | SUBCUTANEOUS | Status: DC
  Administered 2019-01-30 – 2019-02-02 (×7): 40 mg via SUBCUTANEOUS
  Filled 2019-01-29 (×7): qty 0.4

## 2019-01-29 MED ORDER — HYDROMORPHONE HCL 1 MG/ML IJ SOLN
0.5000 mg | INTRAMUSCULAR | Status: DC | PRN
  Administered 2019-01-29: 1 mg via INTRAVENOUS
  Filled 2019-01-29: qty 1

## 2019-01-29 MED ORDER — ADULT MULTIVITAMIN W/MINERALS CH
1.0000 | ORAL_TABLET | Freq: Every day | ORAL | Status: DC
  Administered 2019-01-29 – 2019-02-02 (×5): 1 via ORAL
  Filled 2019-01-29 (×5): qty 1

## 2019-01-29 MED ORDER — DIPHENHYDRAMINE HCL 12.5 MG/5ML PO ELIX: 12.5000 mg | ORAL_SOLUTION | ORAL | Status: DC | PRN

## 2019-01-29 MED ORDER — OXYCODONE HCL 5 MG PO TABS
5.0000 mg | ORAL_TABLET | ORAL | Status: DC | PRN
  Administered 2019-01-31 – 2019-02-02 (×10): 10 mg via ORAL
  Filled 2019-01-29 (×9): qty 2

## 2019-01-29 MED ORDER — BISACODYL 10 MG RE SUPP
10.0000 mg | Freq: Every day | RECTAL | Status: DC | PRN
  Administered 2019-02-01: 10 mg via RECTAL
  Filled 2019-01-29: qty 1

## 2019-01-29 MED ORDER — ZOLPIDEM TARTRATE 5 MG PO TABS: 5.0000 mg | ORAL_TABLET | Freq: Every evening | ORAL | Status: DC | PRN

## 2019-01-29 MED ORDER — DEXMEDETOMIDINE HCL 200 MCG/2ML IV SOLN
INTRAVENOUS | Status: DC | PRN
  Administered 2019-01-29 (×2): 4 ug via INTRAVENOUS

## 2019-01-29 MED ORDER — METHOCARBAMOL 500 MG PO TABS
500.0000 mg | ORAL_TABLET | Freq: Four times a day (QID) | ORAL | Status: DC | PRN
  Administered 2019-01-30 – 2019-02-02 (×8): 500 mg via ORAL
  Filled 2019-01-29 (×8): qty 1

## 2019-01-29 MED ORDER — PROPOFOL 10 MG/ML IV BOLUS
INTRAVENOUS | Status: AC
  Filled 2019-01-29: qty 20

## 2019-01-29 MED ORDER — LACTATED RINGERS IV SOLN
INTRAVENOUS | Status: DC
  Administered 2019-01-29 (×2): via INTRAVENOUS

## 2019-01-29 MED ORDER — SODIUM CHLORIDE 0.9 % IV SOLN
INTRAVENOUS | Status: DC
  Administered 2019-01-29 – 2019-01-30 (×2): via INTRAVENOUS

## 2019-01-29 MED ORDER — BUPIVACAINE HCL (PF) 0.5 % IJ SOLN
INTRAMUSCULAR | Status: AC
  Filled 2019-01-29: qty 10

## 2019-01-29 MED ORDER — PHENYLEPHRINE HCL (PRESSORS) 10 MG/ML IV SOLN
INTRAVENOUS | Status: DC | PRN
  Administered 2019-01-29 (×2): 100 ug via INTRAVENOUS
  Administered 2019-01-29: 200 ug via INTRAVENOUS
  Administered 2019-01-29: 100 ug via INTRAVENOUS

## 2019-01-29 MED ORDER — HEPARIN SODIUM (PORCINE) 10000 UNIT/ML IJ SOLN
INTRAMUSCULAR | Status: AC
  Filled 2019-01-29: qty 3

## 2019-01-29 MED ORDER — GENTAMICIN SULFATE 40 MG/ML IJ SOLN
INTRAMUSCULAR | Status: AC
  Filled 2019-01-29: qty 4

## 2019-01-29 MED ORDER — TRAMADOL HCL 50 MG PO TABS
50.0000 mg | ORAL_TABLET | Freq: Four times a day (QID) | ORAL | Status: DC
  Administered 2019-01-29 – 2019-02-02 (×15): 50 mg via ORAL
  Filled 2019-01-29 (×15): qty 1

## 2019-01-29 MED ORDER — MENTHOL 3 MG MT LOZG
1.0000 | LOZENGE | OROMUCOSAL | Status: DC | PRN
  Filled 2019-01-29: qty 9

## 2019-01-29 MED ORDER — SODIUM CHLORIDE 0.9 % IV SOLN
INTRAVENOUS | Status: DC | PRN
  Administered 2019-01-29: 50 ug/min via INTRAVENOUS

## 2019-01-29 MED ORDER — GLYCOPYRROLATE 0.2 MG/ML IJ SOLN
INTRAMUSCULAR | Status: DC | PRN
  Administered 2019-01-29: 0.2 mg via INTRAVENOUS

## 2019-01-29 MED ORDER — ONDANSETRON HCL 4 MG/2ML IJ SOLN: 4.0000 mg | Freq: Once | INTRAMUSCULAR | Status: DC | PRN

## 2019-01-29 MED ORDER — MIDAZOLAM HCL 2 MG/2ML IJ SOLN
INTRAMUSCULAR | Status: AC
  Filled 2019-01-29: qty 2

## 2019-01-29 MED ORDER — GABAPENTIN 300 MG PO CAPS
300.0000 mg | ORAL_CAPSULE | Freq: Every day | ORAL | Status: DC
  Administered 2019-01-29 – 2019-02-01 (×4): 300 mg via ORAL
  Filled 2019-01-29 (×4): qty 1

## 2019-01-29 MED ORDER — METHOCARBAMOL 1000 MG/10ML IJ SOLN
500.0000 mg | Freq: Four times a day (QID) | INTRAVENOUS | Status: DC | PRN
  Administered 2019-01-29: 500 mg via INTRAVENOUS
  Filled 2019-01-29 (×2): qty 5

## 2019-01-29 MED ORDER — FENTANYL CITRATE (PF) 100 MCG/2ML IJ SOLN
INTRAMUSCULAR | Status: DC | PRN
  Administered 2019-01-29: 25 ug via INTRAVENOUS
  Administered 2019-01-29: 50 ug via INTRAVENOUS
  Administered 2019-01-29: 100 ug via INTRAVENOUS
  Administered 2019-01-29: 25 ug via INTRAVENOUS

## 2019-01-29 MED ORDER — PHENOL 1.4 % MT LIQD
1.0000 | OROMUCOSAL | Status: DC | PRN
  Filled 2019-01-29: qty 177

## 2019-01-29 MED ORDER — FENTANYL CITRATE (PF) 100 MCG/2ML IJ SOLN
INTRAMUSCULAR | Status: AC
  Filled 2019-01-29: qty 2

## 2019-01-29 MED ORDER — METOCLOPRAMIDE HCL 10 MG PO TABS: 5.0000 mg | ORAL_TABLET | Freq: Three times a day (TID) | ORAL | Status: DC | PRN

## 2019-01-29 MED ORDER — SODIUM CHLORIDE 0.9 % IV SOLN
INTRAVENOUS | Status: DC | PRN
  Administered 2019-01-29: 11:00:00 60 mL

## 2019-01-29 MED ORDER — ALUM & MAG HYDROXIDE-SIMETH 200-200-20 MG/5ML PO SUSP: 30.0000 mL | ORAL | Status: DC | PRN

## 2019-01-29 MED ORDER — EPHEDRINE SULFATE 50 MG/ML IJ SOLN
INTRAMUSCULAR | Status: DC | PRN
  Administered 2019-01-29 (×2): 15 mg via INTRAVENOUS
  Administered 2019-01-29 (×2): 10 mg via INTRAVENOUS
  Administered 2019-01-29: 15 mg via INTRAVENOUS

## 2019-01-29 MED ORDER — DEXTROSE 5 % IV SOLN
3.0000 g | Freq: Four times a day (QID) | INTRAVENOUS | Status: AC
  Administered 2019-01-29 – 2019-01-30 (×3): 3 g via INTRAVENOUS
  Filled 2019-01-29 (×2): qty 3
  Filled 2019-01-29: qty 3000

## 2019-01-29 MED ORDER — DOCUSATE SODIUM 100 MG PO CAPS
100.0000 mg | ORAL_CAPSULE | Freq: Two times a day (BID) | ORAL | Status: DC
  Administered 2019-01-29 – 2019-02-02 (×8): 100 mg via ORAL
  Filled 2019-01-29 (×8): qty 1

## 2019-01-29 MED ORDER — ACETAMINOPHEN 500 MG PO TABS
1000.0000 mg | ORAL_TABLET | Freq: Four times a day (QID) | ORAL | Status: AC
  Administered 2019-01-29 (×3): 1000 mg via ORAL
  Filled 2019-01-29 (×3): qty 2

## 2019-01-29 MED ORDER — OXYCODONE HCL 5 MG PO TABS
10.0000 mg | ORAL_TABLET | ORAL | Status: DC | PRN
  Administered 2019-01-29 (×2): 10 mg via ORAL
  Administered 2019-01-30 (×3): 15 mg via ORAL
  Administered 2019-01-30: 10 mg via ORAL
  Filled 2019-01-29: qty 2
  Filled 2019-01-29: qty 3
  Filled 2019-01-29: qty 2
  Filled 2019-01-29: qty 3
  Filled 2019-01-29: qty 2
  Filled 2019-01-29 (×2): qty 3

## 2019-01-29 MED ORDER — DEXAMETHASONE SODIUM PHOSPHATE 10 MG/ML IJ SOLN
INTRAMUSCULAR | Status: AC
  Filled 2019-01-29: qty 1

## 2019-01-29 MED ORDER — ONDANSETRON HCL 4 MG/2ML IJ SOLN: 4.0000 mg | Freq: Four times a day (QID) | INTRAMUSCULAR | Status: DC | PRN

## 2019-01-29 MED ORDER — PROPOFOL 500 MG/50ML IV EMUL
INTRAVENOUS | Status: AC
  Filled 2019-01-29: qty 50

## 2019-01-29 MED ORDER — CARVEDILOL 12.5 MG PO TABS
12.5000 mg | ORAL_TABLET | Freq: Two times a day (BID) | ORAL | Status: DC
  Administered 2019-01-29 – 2019-02-02 (×8): 12.5 mg via ORAL
  Filled 2019-01-29 (×8): qty 1

## 2019-01-29 MED ORDER — MIDAZOLAM HCL 5 MG/5ML IJ SOLN
INTRAMUSCULAR | Status: DC | PRN
  Administered 2019-01-29 (×2): 2 mg via INTRAVENOUS

## 2019-01-29 MED ORDER — PANTOPRAZOLE SODIUM 40 MG PO TBEC: 40.0000 mg | DELAYED_RELEASE_TABLET | Freq: Every day | ORAL | Status: DC

## 2019-01-29 MED ORDER — SODIUM CHLORIDE 0.9 % IV SOLN
INTRAVENOUS | Status: DC | PRN
  Administered 2019-01-29: 500 mL

## 2019-01-29 MED ORDER — METOCLOPRAMIDE HCL 5 MG/ML IJ SOLN: 5.0000 mg | Freq: Three times a day (TID) | INTRAMUSCULAR | Status: DC | PRN

## 2019-01-29 MED ORDER — PROPOFOL 500 MG/50ML IV EMUL
INTRAVENOUS | Status: DC | PRN
  Administered 2019-01-29: 25 ug/kg/min via INTRAVENOUS

## 2019-01-29 MED ORDER — BUPIVACAINE-EPINEPHRINE 0.25% -1:200000 IJ SOLN
INTRAMUSCULAR | Status: DC | PRN
  Administered 2019-01-29: 30 mL

## 2019-01-29 MED ORDER — PANTOPRAZOLE SODIUM 40 MG PO TBEC
40.0000 mg | DELAYED_RELEASE_TABLET | Freq: Every day | ORAL | Status: DC
  Administered 2019-01-30 – 2019-02-02 (×4): 40 mg via ORAL
  Filled 2019-01-29 (×4): qty 1

## 2019-01-29 MED ORDER — LIDOCAINE HCL (PF) 2 % IJ SOLN
INTRAMUSCULAR | Status: AC
  Filled 2019-01-29: qty 10

## 2019-01-29 MED ORDER — MAGNESIUM CITRATE PO SOLN
1.0000 | Freq: Once | ORAL | Status: DC | PRN
  Filled 2019-01-29: qty 296

## 2019-01-29 MED ORDER — FENTANYL CITRATE (PF) 100 MCG/2ML IJ SOLN
INTRAMUSCULAR | Status: AC
  Administered 2019-01-29: 25 ug via INTRAVENOUS
  Filled 2019-01-29: qty 2

## 2019-01-29 MED ORDER — MAGNESIUM HYDROXIDE 400 MG/5ML PO SUSP
30.0000 mL | Freq: Every day | ORAL | Status: DC | PRN
  Administered 2019-01-30 – 2019-01-31 (×2): 30 mL via ORAL
  Filled 2019-01-29 (×2): qty 30

## 2019-01-29 MED ORDER — EPHEDRINE SULFATE 50 MG/ML IJ SOLN
INTRAMUSCULAR | Status: AC
  Filled 2019-01-29: qty 1

## 2019-01-29 MED ORDER — GLYCOPYRROLATE 0.2 MG/ML IJ SOLN
INTRAMUSCULAR | Status: AC
  Filled 2019-01-29: qty 1

## 2019-01-29 MED ORDER — FENTANYL CITRATE (PF) 100 MCG/2ML IJ SOLN
25.0000 ug | INTRAMUSCULAR | Status: DC | PRN
  Administered 2019-01-29: 14:00:00 25 ug via INTRAVENOUS

## 2019-01-29 MED ORDER — ONDANSETRON HCL 4 MG PO TABS: 4.0000 mg | ORAL_TABLET | Freq: Four times a day (QID) | ORAL | Status: DC | PRN

## 2019-01-29 SURGICAL SUPPLY — 68 items
BLADE SAGITTAL AGGR TOOTH XLG (BLADE) ×2 IMPLANT
BNDG COHESIVE 6X5 TAN STRL LF (GAUZE/BANDAGES/DRESSINGS) ×6 IMPLANT
CANISTER SUCT 1200ML W/VALVE (MISCELLANEOUS) ×2 IMPLANT
CANISTER WOUND CARE 500ML ATS (WOUND CARE) ×2 IMPLANT
CELL SAVER ADDITIONAL TIME PER (MISCELLANEOUS) ×2
CELL SAVER COLL SVCS (MISCELLANEOUS) ×2
CELL SAVER FILTER LIPID PALL S (MISCELLANEOUS) ×4
CHLORAPREP W/TINT 26 (MISCELLANEOUS) ×2 IMPLANT
COVER BACK TABLE REUSABLE LG (DRAPES) ×2 IMPLANT
COVER WAND RF STERILE (DRAPES) ×2 IMPLANT
DRAPE 3/4 80X56 (DRAPES) ×6 IMPLANT
DRAPE C-ARM XRAY 36X54 (DRAPES) ×2 IMPLANT
DRAPE INCISE IOBAN 66X60 STRL (DRAPES) IMPLANT
DRAPE POUCH INSTRU U-SHP 10X18 (DRAPES) ×2 IMPLANT
DRESSING SURGICEL FIBRLLR 1X2 (HEMOSTASIS) ×2 IMPLANT
DRSG OPSITE POSTOP 4X8 (GAUZE/BANDAGES/DRESSINGS) ×2 IMPLANT
DRSG SURGICEL FIBRILLAR 1X2 (HEMOSTASIS) ×4
ELECT BLADE 6.5 EXT (BLADE) ×2 IMPLANT
ELECT REM PT RETURN 9FT ADLT (ELECTROSURGICAL) ×2
ELECTRODE REM PT RTRN 9FT ADLT (ELECTROSURGICAL) ×1 IMPLANT
FILTER LIPID PALL S CELL SAVER (MISCELLANEOUS) IMPLANT
GLOVE BIOGEL PI IND STRL 9 (GLOVE) ×1 IMPLANT
GLOVE BIOGEL PI INDICATOR 9 (GLOVE) ×1
GLOVE SURG SYN 9.0  PF PI (GLOVE) ×2
GLOVE SURG SYN 9.0 PF PI (GLOVE) ×2 IMPLANT
GOWN SRG 2XL LVL 4 RGLN SLV (GOWNS) ×1 IMPLANT
GOWN STRL NON-REIN 2XL LVL4 (GOWNS) ×1
GOWN STRL REUS W/ TWL LRG LVL3 (GOWN DISPOSABLE) ×1 IMPLANT
GOWN STRL REUS W/TWL LRG LVL3 (GOWN DISPOSABLE) ×1
HEAD FEMORAL 28MM SZ S (Head) ×1 IMPLANT
HEMOVAC 400CC 10FR (MISCELLANEOUS) IMPLANT
HOLDER FOLEY CATH W/STRAP (MISCELLANEOUS) ×2 IMPLANT
HOOD PEEL AWAY FLYTE STAYCOOL (MISCELLANEOUS) ×2 IMPLANT
KIT PREVENA INCISION MGT 13 (CANNISTER) ×2 IMPLANT
LIGHT WAVEGUIDE WIDE FLAT (MISCELLANEOUS) ×1 IMPLANT
LINER DUAL MOB 50MM (Liner) ×1 IMPLANT
MAT ABSORB  FLUID 56X50 GRAY (MISCELLANEOUS) ×1
MAT ABSORB FLUID 56X50 GRAY (MISCELLANEOUS) ×1 IMPLANT
NDL SAFETY ECLIPSE 18X1.5 (NEEDLE) ×1 IMPLANT
NDL SPNL 20GX3.5 QUINCKE YW (NEEDLE) ×2 IMPLANT
NEEDLE HYPO 18GX1.5 SHARP (NEEDLE) ×1
NEEDLE SPNL 20GX3.5 QUINCKE YW (NEEDLE) ×4 IMPLANT
NS IRRIG 1000ML POUR BTL (IV SOLUTION) ×2 IMPLANT
PACK HIP COMPR (MISCELLANEOUS) ×2 IMPLANT
RETRACTOR YANK SUCT EIGR SABER (INSTRUMENTS) ×1 IMPLANT
SAVER CELL COLL SVCS (MISCELLANEOUS) IMPLANT
SCALPEL PROTECTED #10 DISP (BLADE) ×4 IMPLANT
SHELL ACETABULAR SZ0 50 DME (Shell) ×1 IMPLANT
SOL PREP PVP 2OZ (MISCELLANEOUS)
SOLUTION PREP PVP 2OZ (MISCELLANEOUS) ×1 IMPLANT
SPONGE DRAIN TRACH 4X4 STRL 2S (GAUZE/BANDAGES/DRESSINGS) ×2 IMPLANT
STAPLER SKIN PROX 35W (STAPLE) ×2 IMPLANT
STEM FEM STD CEMENTLESS SZ4 (Stem) ×1 IMPLANT
STRAP SAFETY 5IN WIDE (MISCELLANEOUS) ×2 IMPLANT
SUT DVC 2 QUILL PDO  T11 36X36 (SUTURE) ×1
SUT DVC 2 QUILL PDO T11 36X36 (SUTURE) ×1 IMPLANT
SUT SILK 0 (SUTURE) ×1
SUT SILK 0 30XBRD TIE 6 (SUTURE) ×1 IMPLANT
SUT V-LOC 90 ABS DVC 3-0 CL (SUTURE) ×2 IMPLANT
SUT VIC AB 1 CT1 36 (SUTURE) ×2 IMPLANT
SYR 20ML LL LF (SYRINGE) ×2 IMPLANT
SYR 30ML LL (SYRINGE) ×2 IMPLANT
SYR 50ML LL SCALE MARK (SYRINGE) ×4 IMPLANT
SYR BULB IRRIG 60ML STRL (SYRINGE) ×2 IMPLANT
TAPE MICROFOAM 4IN (TAPE) ×2 IMPLANT
TIME ADDITIONAL PER CELL SAVER (MISCELLANEOUS) IMPLANT
TOWEL OR 17X26 4PK STRL BLUE (TOWEL DISPOSABLE) ×2 IMPLANT
TRAY FOLEY MTR SLVR 16FR STAT (SET/KITS/TRAYS/PACK) ×2 IMPLANT

## 2019-01-29 NOTE — TOC Progression Note (Signed)
Transition of Care [facility]) - Progression Note    Patient Details  Name: [name] [name] MRN: [id number] Date of Birth: 1971-01-13  Transition [facility] CM/SW Contact  Fabien Travelstead, Lenice Llamas Phone Number: [phone]  01/29/2019, 4:23 PM  Clinical Narrative: Lovenox price requested.          Expected Discharge Plan and Services                                                 Social Determinants of Health (SDOH) Interventions    Readmission Risk Interventions Readmission Risk Prevention Plan 11/08/2018  Post Dischage Appt Complete  Medication Screening Complete  Transportation Screening Complete  Some recent data might be hidden

## 2019-01-29 NOTE — Transfer of Care (Signed)
Immediate Anesthesia Transfer of Care Note  Patient: [name] [name]  Procedure(s) Performed: TOTAL HIP ARTHROPLASTY ANTERIOR APPROACH (Left Hip)  Patient Location: PACU  Anesthesia Type:Spinal  Level of Consciousness: awake, alert  and oriented  Airway & Oxygen Therapy: Patient Spontanous Breathing and Patient connected to face mask oxygen  Post-op Assessment: Report given to RN and Post -op Vital signs reviewed and stable  Post vital signs: Reviewed  Last Vitals:  Vitals Value Taken Time  BP 105/64 01/29/19 1245  Temp    Pulse 72 01/29/19 1246  Resp 24 01/29/19 1246  SpO2 100 % 01/29/19 1246  Vitals shown include unvalidated device data.  Last Pain:  Vitals:   01/29/19 0816  TempSrc: Tympanic  PainSc: 2          Complications: No apparent anesthesia complications

## 2019-01-29 NOTE — Progress Notes (Signed)
Autologous blood in    Temp 97.8  Blood pressure 117/59  Heart rate 75  resp rate 20

## 2019-01-29 NOTE — Op Note (Signed)
01/29/2019  12:49 PM  PATIENT:  [name] [name]  48 y.o. female  PRE-OPERATIVE DIAGNOSIS:  PRIMARY LOCALIZED OSTEOARTHRITIS LEFT HIP  POST-OPERATIVE DIAGNOSIS:  OSTEOARTHRITIS LEFT HIP  PROCEDURE:  Procedure(s): TOTAL HIP ARTHROPLASTY ANTERIOR APPROACH (Left)  SURGEON: Laurene Footman, MD  ASSISTANTS: none  ANESTHESIA:   spinal  EBL:  Total I/O In: 2250 [I.V.:1500; Blood:750] Out: 2000 [Urine:100; Blood:1900]  BLOOD ADMINISTERED:750 CC CELLSAVER  DRAINS: (2) Hemovact drain(s) in the subcutaneous layer with  Suction Open   LOCAL MEDICATIONS USED:  MARCAINE    and OTHER Exparel  SPECIMEN:  Source of Specimen:  Left femoral head  DISPOSITION OF SPECIMEN:  PATHOLOGY  COUNTS:  YES  TOURNIQUET:  * No tourniquets in log *  IMPLANTS: Medacta SMS 4 stem with ceramic S 28 mm head, 50 mm Mpact DM cup and liner  DICTATION: .Dragon Dictation   The patient was brought to the operating room and after spinal anesthesia was obtained patient was placed on the operative table with the ipsilateral foot into the Medacta attachment, contralateral leg on a well-padded table. C-arm was brought in and preop template x-ray taken. After prepping and draping in usual sterile fashion appropriate patient identification and timeout procedures were completed. Anterior approach to the hip was obtained and centered over the greater trochanter with a bikini incision utilized and TFL muscle. The subcutaneous tissue was incised hemostasis being achieved by electrocautery. TFL fascia was incised and the muscle retracted laterally deep retractor placed. The lateral femoral circumflex vessels were identified and ligated. The anterior capsule was exposed and a capsulotomy performed. The neck was identified and a femoral neck cut carried out with a saw. The head was removed without difficulty and showed sclerotic femoral head and acetabulum. Reaming was carried out to 50 mm and a 50 mm cup trial gave appropriate tightness  to the acetabular component a 50 DM cup was impacted into position. The leg was then externally rotated and ischiofemoral and pubofemoral releases carried out. The femur was sequentially broached to a size 4 with Trendelenburg used to be a get into the canal, size 4 SMS trial with S head trials were placed and the final components chosen. The 4 standard stem was inserted along with a ceramic S 28 mm head and 50 mm liner. The hip was reduced and was stable the wound was thoroughly irrigated with fibrillar placed along the posterior capsule and medial neck. The deep fascia ws closed using a heavy Quill after infiltration of 30 cc of quarter percent Sensorcaine with epinephrine. and Exparel injected throughout the case to aid in postop analgesia.  3-0 V-loc to close the skin after placing subcutaneous drains with skin staples.  Incisional wound VAC applied.  Patient was sent to recovery in stable condition.   PLAN OF CARE: Admit to inpatient

## 2019-01-29 NOTE — Anesthesia Preprocedure Evaluation (Signed)
Anesthesia Evaluation  Patient identified by MRN, date of birth, ID band Patient awake    Reviewed: Allergy & Precautions, NPO status , Patient's Chart, lab work & pertinent test results  History of Anesthesia Complications (+) PONVNegative for: history of anesthetic complications  Airway Mallampati: I  TM Distance: >3 FB     Dental  (+) Dental Advidsory Given   Pulmonary neg shortness of breath, sleep apnea and Continuous Positive Airway Pressure Ventilation , neg recent URI,           Cardiovascular Exercise Tolerance: Good hypertension, Pt. on medications and Pt. on home beta blockers (-) angina(-) Past MI + dysrhythmias (atrial tachycardia, LBBB) (-) Valvular Problems/Murmurs     Neuro/Psych negative neurological ROS     GI/Hepatic Neg liver ROS, GERD  Medicated and Controlled,  Endo/Other  Morbid obesity  Renal/GU negative Renal ROS     Musculoskeletal negative musculoskeletal ROS (+)   Abdominal   Peds  Hematology negative hematology ROS (+)   Anesthesia Other Findings Past Medical History: 08/16/2016: Appendicitis No date: Arthritis No date: Atrial tachycardia, paroxysmal ([place])     Comment:  a. s/p ablation in 2009 by Dr. Elonda Husky in [facility]               (falied); b. Managed w/ beta blocker. No date: GERD (gastroesophageal reflux disease) No date: Hypertension No date: LBBB (left bundle branch block) No date: Obesity No date: Sleep apnea     Comment:  cpap   Reproductive/Obstetrics                             Anesthesia Physical  Anesthesia Plan  ASA: III  Anesthesia Plan: Spinal   Post-op Pain Management:    Induction: Intravenous  PONV Risk Score and Plan: 3 and Propofol infusion and TIVA  Airway Management Planned: Natural Airway and Simple Face Mask  Additional Equipment:   Intra-op Plan:   Post-operative Plan:   Informed Consent: I have reviewed the  patients History and Physical, chart, labs and discussed the procedure including the risks, benefits and alternatives for the proposed anesthesia with the patient or authorized representative who has indicated his/her understanding and acceptance.       Plan Discussed with:   Anesthesia Plan Comments:         Anesthesia Quick Evaluation

## 2019-01-29 NOTE — Evaluation (Signed)
Physical Therapy Evaluation Patient Details Name: [name] [name] MRN: [id number] DOB: [birth year] Today's Date: 01/29/2019   History of Present Illness  Pt is a 48 y.o. female s/p L THA anterior approach 01/29/19.  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  Clinical Impression  Prior to hospital admission, pt was independent with ambulation.  Pt lives with her husband in 1 level home with 4 steps (B railings) to enter.  Currently pt is min to mod assist x1 with bed mobility (heavy use of bed rails); min assist x2 to stand up to bariatric RW; and CGA to min assist x2 to ambulate a few feet bed to recliner with bariatric RW.  Initial education and visual demo of [place] status provided as well as consistent cueing with activity in order to maintain [place] status.  Tolerated LE ex's in bed fairly well.  Pt would benefit from skilled PT to address noted impairments and functional limitations (see below for any additional details).  Upon hospital discharge, anticipate with continued progress pt will be able to discharge home with HHPT and support of husband.    Follow Up Recommendations Home health PT    Equipment Recommendations  Rolling walker with 5" wheels;3in1 (PT)(bariatric)    Recommendations for Other Services OT consult     Precautions / Restrictions Precautions Precautions: Fall;Anterior Hip Precaution Booklet Issued: Yes (comment) Precaution Comments: wound vac; hemovac; foley catheter Restrictions Weight Bearing Restrictions: Yes LLE Weight Bearing: Partial weight bearing LLE Partial Weight Bearing Percentage or Pounds: 50%      Mobility  Bed Mobility Overal bed mobility: Needs Assistance Bed Mobility: Supine to Sit     Supine to sit: Min assist;Mod assist;HOB elevated     General bed mobility comments: heavy use of bed rails; increased effort and time for pt to perform; min assist for L LE and mod  assist for trunk  Transfers Overall transfer level: Needs assistance Equipment used: Rolling walker (2 wheeled)(bariatric) Transfers: Sit to/from Stand Sit to Stand: Min assist;+2 physical assistance         General transfer comment: min assist x2 to stand from elevated bed x1 trial and then min assist x2 to stand from bed at lowest position x1 trial; pt positions L LE outside walker and R LE inside walker to stand; vc's for PWB'ing status; use of momentum to stand  Ambulation/Gait Ambulation/Gait assistance: Min guard;Min assist;+2 physical assistance;+2 safety/equipment Gait Distance (Feet): 3 Feet(bed to recliner) Assistive device: Rolling walker (2 wheeled)(bariatric)   Gait velocity: decreased   General Gait Details: vc's for PWB'ing status and to increase UE support through RW to offweight L LE; vc's for stepping technique  Stairs            Wheelchair Mobility    Modified Rankin (Stroke Patients Only)       Balance Overall balance assessment: Needs assistance Sitting-balance support: No upper extremity supported;Feet supported Sitting balance-Leahy Scale: Good Sitting balance - Comments: steady sitting reaching within BOS   Standing balance support: Single extremity supported Standing balance-Leahy Scale: Poor Standing balance comment: pt requiring at least single UE support for static standing balance                             Pertinent Vitals/Pain Pain Assessment: 0-10 Pain Score: 4  Pain Location: L hip Pain Descriptors / Indicators: Sore Pain Intervention(s): Limited activity within patient's tolerance;Monitored during session;Premedicated before  session;Repositioned;Ice applied  Vitals (HR and O2 on room air) stable and WFL throughout treatment session.    Home Living Family/patient expects to be discharged to:: Private residence Living Arrangements: Spouse/significant other Available Help at Discharge: Family Type of Home:  House Home Access: Stairs to enter Entrance Stairs-Rails: Doctor, general practiceight;Left Entrance Stairs-Number of Steps: 4   Home Equipment: Environmental consultantWalker - 2 wheels;Cane - single point;Crutches;Bedside commode      Prior Function Level of Independence: Independent         Comments: Independent with ambulation.  Finished physical therapy for R TKA.  Has been sleeping in recliner since R TKA (pt reports LBP since last surgery and also has muscle spasms to R LE since last surgery).     Hand Dominance        Extremity/Trunk Assessment   Upper Extremity Assessment Upper Extremity Assessment: Generalized weakness    Lower Extremity Assessment Lower Extremity Assessment: RLE deficits/detail;LLE deficits/detail LLE Deficits / Details: good quad set; hip flexion at least 3-/5 (limited d/t L hip pain); knee flexion/extension at least 3/5 AROM; DF/PF at least 3/5 AROM LLE: Unable to fully assess due to pain    Cervical / Trunk Assessment Cervical / Trunk Assessment: (forward head)  Communication   Communication: No difficulties  Cognition Arousal/Alertness: Awake/alert Behavior During Therapy: WFL for tasks assessed/performed Overall Cognitive Status: Within Functional Limits for tasks assessed                                        General Comments General comments (skin integrity, edema, etc.): foley catheter, hemovac, and wound vac all appearing intact beginning/end of session.  Nursing cleared pt for participation in physical therapy.  Pt agreeable to PT session.  Pt's husband present part of session.  Extra time spent repositioning pt for comfort in recliner (pt reports L knee cannot be straight in chair d/t pain--needs surgery; and also needed pillow to low back to decrease LBP).    Exercises Total Joint Exercises Ankle Circles/Pumps: AROM;Strengthening;Both;10 reps;Supine Quad Sets: AROM;Strengthening;Both;10 reps;Supine Gluteal Sets: AROM;Strengthening;Both;10 reps;Supine Towel  Squeeze: AROM;Strengthening;Both;10 reps;Supine Short Arc Quad: AAROM;Strengthening;Left;10 reps;Supine Heel Slides: AAROM;Strengthening;Left;10 reps;Supine Hip ABduction/ADduction: AAROM;Strengthening;Left;10 reps;Supine   Assessment/Plan    PT Assessment Patient needs continued PT services  PT Problem List Decreased strength;Decreased range of motion;Decreased activity tolerance;Decreased balance;Decreased mobility;Decreased knowledge of use of DME;Decreased knowledge of precautions;Pain;Decreased skin integrity;Obesity       PT Treatment Interventions DME instruction;Gait training;Stair training;Functional mobility training;Therapeutic activities;Therapeutic exercise;Balance training;Patient/family education    PT Goals (Current goals can be found in the Care Plan section)  Acute Rehab PT Goals Patient Stated Goal: to go home and have less pain PT Goal Formulation: With patient Time For Goal Achievement: 02/12/19 Potential to Achieve Goals: Good    Frequency BID   Barriers to discharge        Co-evaluation               AM-PAC PT "6 Clicks" Mobility  Outcome Measure Help needed turning from your back to your side while in a flat bed without using bedrails?: A Lot Help needed moving from lying on your back to sitting on the side of a flat bed without using bedrails?: A Lot Help needed moving to and from a bed to a chair (including a wheelchair)?: A Lot Help needed standing up from a chair using your arms (e.g., wheelchair or bedside chair)?: A  Lot Help needed to walk in hospital room?: A Little Help needed climbing 3-5 steps with a railing? : Total 6 Click Score: 12    End of Session Equipment Utilized During Treatment: Gait belt Activity Tolerance: Patient tolerated treatment well Patient left: in chair;with call bell/phone within reach;with chair alarm set;with family/visitor present;with SCD's reapplied;Other (comment)(B heels floating; ice pack to L hip) Nurse  Communication: Mobility status;Precautions;Weight bearing status PT Visit Diagnosis: Other abnormalities of gait and mobility (R26.89);Muscle weakness (generalized) (M62.81);Difficulty in walking, not elsewhere classified (R26.2);Pain Pain - Right/Left: Left Pain - part of body: Hip    Time: 1610-9604 PT Time Calculation (min) (ACUTE ONLY): 58 min   Charges:   PT Evaluation $PT Eval Low Complexity: 1 Low PT Treatments $Therapeutic Exercise: 23-37 mins $Therapeutic Activity: 8-22 mins        Hendricks Limes, PT 01/29/19, 5:04 PM [phone]

## 2019-01-29 NOTE — H&P (Signed)
Reviewed paper H+P, will be scanned into chart. No changes noted.  

## 2019-01-29 NOTE — Anesthesia Post-op Follow-up Note (Signed)
Anesthesia QCDR form completed.        

## 2019-01-29 NOTE — Anesthesia Procedure Notes (Signed)
Spinal  Patient location during procedure: OR Staffing Anesthesiologist: Martha Clan, MD Resident/CRNA: Rolla Plate, CRNA Performed: resident/CRNA  Preanesthetic Checklist Completed: patient identified, site marked, surgical consent, pre-op evaluation, timeout performed, IV checked, risks and benefits discussed and monitors and equipment checked Spinal Block Patient position: sitting Prep: ChloraPrep and site prepped and draped Patient monitoring: heart rate, continuous pulse ox, blood pressure and cardiac monitor Approach: midline Location: L4-5 Injection technique: single-shot Needle Needle type: Pencil-Tip  Needle gauge: 22 G Needle length: 9 cm Additional Notes Negative paresthesia. Negative blood return. Positive free-flowing CSF. Expiration date of kit checked and confirmed. Patient tolerated procedure well, without complications.

## 2019-01-30 ENCOUNTER — Encounter: Payer: Self-pay | Admitting: Orthopedic Surgery

## 2019-01-30 LAB — CBC
HCT: 32 % — ABNORMAL LOW (ref 36.0–46.0)
Hemoglobin: 9.7 g/dL — ABNORMAL LOW (ref 12.0–15.0)
MCH: 24.1 pg — ABNORMAL LOW (ref 26.0–34.0)
MCHC: 30.3 g/dL (ref 30.0–36.0)
MCV: 79.4 fL — ABNORMAL LOW (ref 80.0–100.0)
Platelets: 310 10*3/uL (ref 150–400)
RBC: 4.03 MIL/uL (ref 3.87–5.11)
RDW: 15.1 % (ref 11.5–15.5)
WBC: 16.4 10*3/uL — ABNORMAL HIGH (ref 4.0–10.5)
nRBC: 0 % (ref 0.0–0.2)

## 2019-01-30 LAB — BASIC METABOLIC PANEL
Anion gap: 6 (ref 5–15)
BUN: 13 mg/dL (ref 6–20)
CO2: 27 mmol/L (ref 22–32)
Calcium: 8.3 mg/dL — ABNORMAL LOW (ref 8.9–10.3)
Chloride: 101 mmol/L (ref 98–111)
Creatinine, Ser: 0.59 mg/dL (ref 0.44–1.00)
GFR calc Af Amer: >60 mL/min (ref >60)
GFR calc non Af Amer: >60 mL/min (ref >60)
Glucose, Bld: 155 mg/dL — ABNORMAL HIGH (ref 70–99)
Potassium: 3.7 mmol/L (ref 3.5–5.1)
Sodium: 134 mmol/L — ABNORMAL LOW (ref 135–145)

## 2019-01-30 LAB — SURGICAL PATHOLOGY

## 2019-01-30 MED ORDER — ENOXAPARIN SODIUM 40 MG/0.4ML ~~LOC~~ SOLN: 40.0000 mg | SUBCUTANEOUS | 0 refills | Status: DC

## 2019-01-30 MED ORDER — METHOCARBAMOL 500 MG PO TABS: 500.0000 mg | ORAL_TABLET | Freq: Four times a day (QID) | ORAL | 0 refills | Status: DC | PRN

## 2019-01-30 MED ORDER — OXYCODONE HCL 10 MG PO TABS: 10.0000 mg | ORAL_TABLET | ORAL | 0 refills | Status: DC | PRN

## 2019-01-30 MED ORDER — FE FUMARATE-B12-VIT C-FA-IFC PO CAPS
1.0000 | ORAL_CAPSULE | Freq: Two times a day (BID) | ORAL | Status: DC
  Administered 2019-01-30 – 2019-02-02 (×7): 1 via ORAL
  Filled 2019-01-30 (×8): qty 1

## 2019-01-30 NOTE — TOC Benefit Eligibility Note (Signed)
Transition of Care [facility]) Benefit Eligibility Note    Patient Details  Name: [name] [name] MRN: [id number] Date of Birth: 1970-09-20   Medication/Dose: Enoxaparin 40mg  once daily for 14 days  Covered?: Yes  Prescription Coverage Preferred Pharmacy: [facility] with Person/Company/Phone Number:: Suezanne Jacquet with [employer] at [phone]  Co-Pay: $0 estimated copay  Prior Approval: No  Deductible: (No deductible per rep.)   Dannette Barbara Phone Number: [phone] or [phone] 01/30/2019, 9:15 AM

## 2019-01-30 NOTE — Progress Notes (Signed)
Physical Therapy Treatment Patient Details Name: [name] [name] MRN: [id number] DOB: 1970/10/22 Today's Date: 01/30/2019    History of Present Illness Pt is a 48 y.o. female s/p L THA anterior approach 01/29/19.  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    PT Comments    Pt ready for session.  To edge of bed without assist but heavy use of rails.  Sleeps in recliner at home. She is able to progress gait to 60' with bariatric RW and heavy use of UE to maintain PWB but overall she is able to maintain it well with minimal UE fatigue. Steady with no LOB or buckling.    Co-tx with OT - only 1 unit PT billed.    Follow Up Recommendations   HHPT     Equipment Recommendations  Rolling walker with 5" wheels;3in1 (PT)    Recommendations for Other Services       Precautions / Restrictions Precautions Precautions: Fall;Anterior Hip Precaution Booklet Issued: Yes (comment) Precaution Comments: wound vac; hemovac; foley catheter Restrictions Weight Bearing Restrictions: Yes LLE Weight Bearing: Partial weight bearing LLE Partial Weight Bearing Percentage or Pounds: 50%    Mobility  Bed Mobility Overal bed mobility: Needs Assistance Bed Mobility: Supine to Sit     Supine to sit: Min guard     General bed mobility comments: heavy use of rails.  Transfers Overall transfer level: Needs assistance Equipment used: Rolling walker (2 wheeled) Transfers: Sit to/from Stand Sit to Stand: Min guard;+2 safety/equipment            Ambulation/Gait Ambulation/Gait assistance: Min guard;+2 safety/equipment Gait Distance (Feet): 80 Feet Assistive device: Rolling walker (2 wheeled) Gait Pattern/deviations: Step-to pattern Gait velocity: decreased   General Gait Details: PWB maintained LLE   Stairs             Wheelchair Mobility    Modified Rankin (Stroke Patients Only)       Balance Overall  balance assessment: Needs assistance Sitting-balance support: No upper extremity supported;Feet supported Sitting balance-Leahy Scale: Good Sitting balance - Comments: steady sitting reaching within BOS   Standing balance support: Bilateral upper extremity supported Standing balance-Leahy Scale: Good Standing balance comment: steady with BUE support.                            Cognition Arousal/Alertness: Awake/alert Behavior During Therapy: WFL for tasks assessed/performed Overall Cognitive Status: Within Functional Limits for tasks assessed                                        Exercises      General Comments        Pertinent Vitals/Pain Pain Assessment: 0-10 Pain Score: 4  Pain Location: L hip- at rest, increases with movement Pain Descriptors / Indicators: Sore;Aching Pain Intervention(s): Monitored during session;Limited activity within patient's tolerance    Home Living                      Prior Function            PT Goals (current goals can now be found in the care plan section) Progress towards PT goals: Progressing toward goals    Frequency    BID      PT Plan Current plan remains appropriate    Co-evaluation  PT/OT/SLP Co-Evaluation/Treatment: Yes Reason for Co-Treatment: For patient/therapist safety PT goals addressed during session: Mobility/safety with mobility;Balance OT goals addressed during session: Proper use of Adaptive equipment and DME;ADL's and self-care      AM-PAC PT "6 Clicks" Mobility   Outcome Measure  Help needed turning from your back to your side while in a flat bed without using bedrails?: A Lot Help needed moving from lying on your back to sitting on the side of a flat bed without using bedrails?: A Lot Help needed moving to and from a bed to a chair (including a wheelchair)?: A Little Help needed standing up from a chair using your arms (e.g., wheelchair or bedside chair)?: A  Little Help needed to walk in hospital room?: A Little Help needed climbing 3-5 steps with a railing? : A Lot 6 Click Score: 15    End of Session Equipment Utilized During Treatment: Gait belt Activity Tolerance: Patient tolerated treatment well Patient left: in bed;with call bell/phone within reach;with chair alarm set;Other (comment) Nurse Communication: Mobility status;Weight bearing status Pain - Right/Left: Left Pain - part of body: Hip     Time: 4098-1191 PT Time Calculation (min) (ACUTE ONLY): 24 min  Charges:  $Gait Training: 8-22 mins $Therapeutic Activity: 8-22 mins                    Danielle Dess, PTA 01/30/19, 10:34 AM

## 2019-01-30 NOTE — Evaluation (Signed)
Occupational Therapy Evaluation Patient Details Name: [name] PhoenixKristi Faughnan MRN: [id number] DOB: 09-20-1970 Today's Date: 01/30/2019    History of Present Illness Pt is a 48 y.o. female s/p L THA anterior approach 01/29/19.  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   Clinical Impression   Pt seen for OT evaluation and co-tx with PTA this date, POD#1 from above surgery. Pt was independent in all ADLs prior to surgery and had recently completed therapy for her previous R TKR. Pt is eager to return to PLOF with less pain and improved safety and independence. Pt currently requires min-mod assist for LB dressing and bathing while in seated position due to pain and limited AROM of L hip. Functional transfer and RW mgt training with PTA with pt demonstrating good carryover of PWBing for LLE during mobility. Cues for RW placement to support improved BUE support for off weighting LLE. Pt instructed in self care skills, falls prevention strategies, home/routines modifications, DME/AE for LB bathing and dressing tasks, and compression stocking mgt strategies. Handout provided to support recall and carryover. Pt would benefit from additional instruction in self care skills and techniques to help maintain precautions with or without assistive devices to support recall and carryover prior to discharge. Do not anticipate need for skilled OT services upon discharge. Will continue to assess as pt progresses.     Follow Up Recommendations  No OT follow up    Equipment Recommendations  Other (comment)(reacher, LH sponge)    Recommendations for Other Services       Precautions / Restrictions Precautions Precautions: Fall;Anterior Hip Precaution Booklet Issued: Yes (comment) Precaution Comments: wound vac; hemovac Restrictions Weight Bearing Restrictions: Yes LLE Weight Bearing: Partial weight bearing LLE Partial Weight Bearing Percentage or  Pounds: 50%      Mobility Bed Mobility Overal bed mobility: Needs Assistance Bed Mobility: Supine to Sit     Supine to sit: Min guard     General bed mobility comments: heavy use of rails.  Transfers Overall transfer level: Needs assistance Equipment used: Rolling walker (2 wheeled) Transfers: Sit to/from Stand Sit to Stand: Min guard;+2 safety/equipment              Balance Overall balance assessment: Needs assistance Sitting-balance support: No upper extremity supported;Feet supported Sitting balance-Leahy Scale: Good Sitting balance - Comments: steady sitting reaching within BOS   Standing balance support: Bilateral upper extremity supported Standing balance-Leahy Scale: Good Standing balance comment: steady with BUE support.                           ADL either performed or assessed with clinical judgement   ADL                                         General ADL Comments: Pt requires Min-Mod A for LB ADL tasks, improving to Min A with use of AE, Max A for compression stocking mgt; CGA x2 for safety for functional ADL transfers; pt reports spouse can assist with ADL     Vision Baseline Vision/History: Wears glasses Wears Glasses: At all times Patient Visual Report: No change from baseline       Perception     Praxis      Pertinent Vitals/Pain Pain Assessment: 0-10 Pain Score: 4  Pain Location: L hip- at rest,  increases with movement Pain Descriptors / Indicators: Sore;Aching Pain Intervention(s): Limited activity within patient's tolerance;Monitored during session;RN gave pain meds during session;Repositioned     Hand Dominance Right   Extremity/Trunk Assessment Upper Extremity Assessment Upper Extremity Assessment: Generalized weakness   Lower Extremity Assessment Lower Extremity Assessment: LLE deficits/detail;Defer to PT evaluation LLE Deficits / Details: good quad set; hip flexion at least 3-/5 (limited d/t L  hip pain); knee flexion/extension at least 3/5 AROM; DF/PF at least 3/5 AROM LLE: Unable to fully assess due to pain       Communication Communication Communication: No difficulties   Cognition Arousal/Alertness: Awake/alert Behavior During Therapy: WFL for tasks assessed/performed Overall Cognitive Status: Within Functional Limits for tasks assessed                                     General Comments  hemovac, wound vac in place    Exercises Other Exercises Other Exercises: Pt instructed in falls prevention strategies, compression stocking mgt, AE/DME, and home/routines modifications to max safety/indep with handout provided to support recall and carryover Other Exercises: functional transfer training and RW mgt training w/ PT   Shoulder Instructions      Home Living Family/patient expects to be discharged to:: Private residence Living Arrangements: Spouse/significant other Available Help at Discharge: Family;Available 24 hours/day Type of Home: House Home Access: Stairs to enter [employer] of Steps: 4 Entrance Stairs-Rails: Right;Left Home Layout: One level     Bathroom Shower/Tub: Walk-in shower;Curtain   Bathroom Toilet: Standard(Has BSC over toilet)     Home Equipment: Walker - 2 wheels;Cane - single point;Crutches;Bedside commode;Adaptive equipment Adaptive Equipment: Sock aid;Long-handled shoe horn        Prior Functioning/Environment Level of Independence: Independent        Comments: Independent with ambulation.  Finished physical therapy for R TKA.  Has been sleeping in recliner since R TKA (pt reports LBP since last surgery and also has muscle spasms to R LE since last surgery).        OT Problem List: Decreased strength;Decreased range of motion;Pain;Impaired balance (sitting and/or standing)      OT Treatment/Interventions: Self-care/ADL training;Therapeutic exercise;Therapeutic activities;DME and/or AE  instruction;Patient/family education    OT Goals(Current goals can be found in the care plan section) Acute Rehab OT Goals Patient Stated Goal: to go home and have less pain OT Goal Formulation: With patient Time For Goal Achievement: 02/13/19 Potential to Achieve Goals: Good ADL Goals Pt Will Perform Lower Body Dressing: sit to/from stand;with adaptive equipment;with min guard assist Pt Will Transfer to Toilet: with supervision;ambulating(bari BSC over toilet, LRAD for amb, PWBing LLE) Additional ADL Goal #1: Pt will independently instruct family in compression stocking mgt  OT Frequency: Min 2X/week   Barriers to D/C:            Co-evaluation PT/OT/SLP Co-Evaluation/Treatment: Yes Reason for Co-Treatment: For patient/therapist safety;To address functional/ADL transfers PT goals addressed during session: Mobility/safety with mobility;Balance;Proper use of DME OT goals addressed during session: Proper use of Adaptive equipment and DME;ADL's and self-care      AM-PAC OT "6 Clicks" Daily Activity     Outcome Measure Help from another person eating meals?: None Help from another person taking care of personal grooming?: None Help from another person toileting, which includes using toliet, bedpan, or urinal?: A Little Help from another person bathing (including washing, rinsing, drying)?: A Little Help from another person to  put on and taking off regular upper body clothing?: None Help from another person to put on and taking off regular lower body clothing?: A Little 6 Click Score: 21   End of Session Equipment Utilized During Treatment: Gait belt;Rolling walker  Activity Tolerance: Patient tolerated treatment well Patient left: in chair;with call bell/phone within reach;with chair alarm set  OT Visit Diagnosis: Other abnormalities of gait and mobility (R26.89);Pain Pain - Right/Left: Left Pain - part of body: Hip;Knee                Time: 6294-7654 OT Time Calculation  (min): 40 min Charges:  OT General Charges $OT Visit: 1 Visit OT Evaluation $OT Eval Low Complexity: 1 Low OT Treatments $Self Care/Home Management : 8-22 mins $Therapeutic Activity: 8-22 mins  Richrd Prime, MPH, MS, OTR/L ascom [phone] 01/30/19, 11:00 AM

## 2019-01-30 NOTE — Anesthesia Postprocedure Evaluation (Signed)
Anesthesia Post Note  Patient: Teaching laboratory technician  Procedure(s) Performed: TOTAL HIP ARTHROPLASTY ANTERIOR APPROACH (Left Hip)  Patient location during evaluation: Nursing Unit Anesthesia Type: Spinal Level of consciousness: awake, awake and alert and oriented Pain management: pain level controlled Vital Signs Assessment: post-procedure vital signs reviewed and stable Respiratory status: spontaneous breathing, nonlabored ventilation and respiratory function stable Cardiovascular status: blood pressure returned to baseline and stable Postop Assessment: no headache and no backache Anesthetic complications: no     Last Vitals:  Vitals:   01/29/19 2323 01/30/19 0431  BP: 130/67 125/61  Pulse: 83 80  Resp: 17 16  Temp: (!) 36.4 C 36.7 C  SpO2: 95% 96%    Last Pain:  Vitals:   01/30/19 0431  TempSrc: Oral  PainSc:                  Johnna Acosta

## 2019-01-30 NOTE — Discharge Summary (Addendum)
Physician Discharge Summary  Patient ID: [name] [name] MRN: [id number] DOB/AGE: January 08, 1971 48 y.o.  Admit date: 01/29/2019 Discharge date: 02/02/2019 Admission Diagnoses:  PRIMARY LOCALIZED OSTEOARTHRITIS LEFT HIP   Discharge Diagnoses: Patient Active Problem List   Diagnosis Date Noted  . Status post total hip replacement, left 01/29/2019  . Status post total knee replacement using cement, right 11/06/2018  . Sprain of ankle 11/20/2017  . Acute left-sided back pain 09/19/2017  . Encounter for biometric screening 09/19/2017  . Gastroesophageal reflux disease without esophagitis 09/19/2017  . Cyst of right ovary 07/03/2017  . DUB (dysfunctional uterine bleeding) 07/03/2017  . Vaginal mass 07/03/2017  . Osteoarthritis of knee 09/19/2016  . Sleep apnea   . Appendicitis, acute 08/16/2016  . Pain in joint, lower leg 01/12/2014  . Obesity   . Hypertension   . Atrial tachycardia, paroxysmal (HCC)   . LBBB (left bundle branch block)     Past Medical History:  Diagnosis Date  . Appendicitis 08/16/2016  . Arthritis   . Atrial tachycardia, paroxysmal (HCC)    a. s/p ablation in 2009 by Dr. Chales Abrahams in [facility] (falied); b. Managed w/ beta blocker.  Marland Kitchen GERD (gastroesophageal reflux disease)   . Hypertension   . LBBB (left bundle branch block)    atrial tach.  . Obesity   . Sleep apnea    cpap     Transfusion: none   Consultants (if any):   Discharged Condition: Improved  Hospital Course: [name] [name] is an 48 y.o. female who was admitted 01/29/2019 with a diagnosis of left hip osteoarthritis and went to the operating room on 01/29/2019 and underwent the above named procedures.    Surgeries: Procedure(s): TOTAL HIP ARTHROPLASTY ANTERIOR APPROACH on 01/29/2019 Patient tolerated the surgery well. Taken to PACU where she was stabilized and then transferred to the orthopedic floor.  Started on Lovenox 40 mg  q 12 hrs. Foot pumps applied bilaterally at 80 mm. Heels elevated on bed  with rolled towels. No evidence of DVT. Negative Homan. Physical therapy started on day #1 for gait training and transfer. OT started day #1 for ADL and assisted devices.  Patient's foley was d/c on day #1. Patient's IV and hemovac was d/c on day #2.  On post op day #4 patient was stable and ready for discharge to home with HHPT.  Implants:  Medacta SMS 4 stem with ceramic S 28 mm head, 50 mm Mpact DM cup and liner  She was given perioperative antibiotics:  Anti-infectives (From admission, onward)   Start     Dose/Rate Route Frequency Ordered Stop   01/29/19 1600  ceFAZolin (ANCEF) 3 g in dextrose 5 % 50 mL IVPB     3 g 100 mL/hr over 30 Minutes Intravenous Every 6 hours 01/29/19 1428 01/30/19 0335   01/29/19 1032  gentamicin (GARAMYCIN) 80 mg in sodium chloride 0.9 % 500 mL irrigation  Status:  Discontinued       As needed 01/29/19 1032 01/29/19 1241   01/28/19 2315  ceFAZolin (ANCEF) 3 g in dextrose 5 % 50 mL IVPB     3 g 100 mL/hr over 30 Minutes Intravenous  Once 01/28/19 2314 01/29/19 1015    .  She was given sequential compression devices, early ambulation, and Lovenox TEDs for DVT prophylaxis.  She benefited maximally from the hospital stay and there were no complications.    Recent vital signs:  Vitals:   02/01/19 2318 02/02/19 0800  BP: (!) 117/54 (!) 112/48  Pulse:  83 82  Resp: 19 18  Temp: 98.7 F (37.1 C) 98.1 F (36.7 C)  SpO2: 99% 99%    Recent laboratory studies:  Lab Results  Component Value Date   HGB 8.9 (L) 01/31/2019   HGB 9.7 (L) 01/30/2019   HGB 11.2 (L) 01/29/2019   Lab Results  Component Value Date   WBC 10.9 (H) 01/31/2019   PLT 265 01/31/2019   Lab Results  Component Value Date   INR 1.0 01/19/2019   Lab Results  Component Value Date   NA 136 01/31/2019   K 3.9 01/31/2019   CL 101 01/31/2019   CO2 26 01/31/2019   BUN 17 01/31/2019   CREATININE 0.54 01/31/2019   GLUCOSE 106 (H) 01/31/2019    Discharge Medications:   Allergies  as of 02/02/2019      Reactions   Aspirin Swelling      Medication List    STOP taking these medications   meloxicam 15 MG tablet Commonly known as: MOBIC     TAKE these medications   acetaminophen 500 MG tablet Commonly known as: TYLENOL Take 1,000 mg by mouth every 6 (six) hours as needed for moderate pain or headache.   carvedilol 12.5 MG tablet Commonly known as: COREG Take 1 tablet (12.5 mg total) by mouth 2 (two) times daily.   enoxaparin 40 MG/0.4ML injection Commonly known as: LOVENOX Inject 0.4 mLs (40 mg total) into the skin daily for 14 days.   EQ MULTIVITAMINS ADULT GUMMY PO Take 2 each by mouth daily.   gabapentin 300 MG capsule Commonly known as: NEURONTIN Take 300 mg by mouth at bedtime.   methocarbamol 500 MG tablet Commonly known as: ROBAXIN Take 1 tablet (500 mg total) by mouth every 6 (six) hours as needed for muscle spasms.   Oxycodone HCl 10 MG Tabs Take 1-1.5 tablets (10-15 mg total) by mouth every 4 (four) hours as needed for severe pain (pain score 7-10).   pantoprazole 40 MG tablet Commonly known as: PROTONIX Take 40 mg by mouth daily.       Diagnostic Studies: Dg Hip Operative Unilat W Or W/o Pelvis Left  Result Date: 01/29/2019 CLINICAL DATA:  Left anterior hip placed EXAM: OPERATIVE LEFT HIP (WITH PELVIS IF PERFORMED) 2 VIEWS TECHNIQUE: Fluoroscopic spot image(s) were submitted for interpretation post-operatively. COMPARISON:  None. FINDINGS: Interval left total hip arthroplasty without failure or complication. No dislocation. IMPRESSION: Interval left total hip arthroplasty. Electronically Signed   By: Elige KoHetal  Patel   On: 01/29/2019 12:51   Dg Hip Unilat W Or W/o Pelvis 2-3 Views Left  Result Date: 01/29/2019 CLINICAL DATA:  Status post left hip replacement. EXAM: DG HIP (WITH OR WITHOUT PELVIS) 2-3V LEFT COMPARISON:  None. FINDINGS: Left hip prosthesis in normal position and alignment in the frontal projection. This cannot be viewed in  its entirety in the lateral projection due to the thickness of the overlying soft tissues. The visualized portions are unremarkable. No visible fracture or dislocation. IMPRESSION: Satisfactory postoperative appearance of a left hip prosthesis with limited visualization in the lateral projection due to body habitus. Electronically Signed   By: Beckie SaltsSteven  Reid M.D.   On: 01/29/2019 13:59    Disposition:     Follow-up Information    Evon SlackGaines, Aleila Syverson C, PA-C Follow up in 2 week(s).   Specialties: Orthopedic Surgery, Emergency Medicine Contact information: [address] [place] [address] [phone]            Signed: Patience MuscaGAINES, Khup Sapia CHRISTOPHER  02/02/2019, 8:01 AM

## 2019-01-30 NOTE — Progress Notes (Signed)
Physical Therapy Treatment Patient Details Name: [name] [name] MRN: [id number] DOB: 12/13/1970 Today's Date: 01/30/2019    History of Present Illness Pt is a 48 y.o. female s/p L THA anterior approach 01/29/19.  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    PT Comments    Stood with min guard and gait to bathroom to void.  Inc urine upon standing.  She walked 55' total during PM session self limited due to general fatigue but overall steady with no LOB or buckling noted.  Returned to supine with assist for LLE into bed.     Follow Up Recommendations   HHPT     Equipment Recommendations  Rolling walker with 5" wheels;3in1 (PT)    Recommendations for Other Services       Precautions / Restrictions Precautions Precautions: Fall;Anterior Hip Precaution Booklet Issued: Yes (comment) Precaution Comments: wound vac; hemovac Restrictions Weight Bearing Restrictions: Yes LLE Weight Bearing: Partial weight bearing LLE Partial Weight Bearing Percentage or Pounds: 50%    Mobility  Bed Mobility Overal bed mobility: Needs Assistance Bed Mobility: Sit to Supine     Supine to sit: Min guard Sit to supine: Min assist   General bed mobility comments: for LLE  Transfers Overall transfer level: Needs assistance Equipment used: Rolling walker (2 wheeled) Transfers: Sit to/from Stand Sit to Stand: Min guard            Ambulation/Gait Ambulation/Gait assistance: Land (Feet): 55 Feet Assistive device: Rolling walker (2 wheeled) Gait Pattern/deviations: Step-to pattern Gait velocity: decreased   General Gait Details: PWB maintained LLE - self limited this pm due to fatigue   Stairs             Wheelchair Mobility    Modified Rankin (Stroke Patients Only)       Balance Overall balance assessment: Needs assistance Sitting-balance support: No upper extremity supported;Feet  supported Sitting balance-Leahy Scale: Good Sitting balance - Comments: steady sitting reaching within BOS   Standing balance support: Bilateral upper extremity supported Standing balance-Leahy Scale: Good Standing balance comment: steady with BUE support.                            Cognition Arousal/Alertness: Awake/alert Behavior During Therapy: WFL for tasks assessed/performed Overall Cognitive Status: Within Functional Limits for tasks assessed                                        Exercises Other Exercises Other Exercises: to commode to void Other Exercises: functional transfer training and RW mgt training w/ PT    General Comments General comments (skin integrity, edema, etc.): hemovac, wound vac in place      Pertinent Vitals/Pain Pain Assessment: 0-10 Pain Score: 4  Pain Location: L hip- at rest, increases with movement, some inc knee pain with ext in recliner. Pain Descriptors / Indicators: Sore;Aching Pain Intervention(s): Limited activity within patient's tolerance;Monitored during session    Home Living Family/patient expects to be discharged to:: Private residence Living Arrangements: Spouse/significant other Available Help at Discharge: Family;Available 24 hours/day Type of Home: House Home Access: Stairs to enter Entrance Stairs-Rails: Right;Left Home Layout: One level Home Equipment: Environmental consultant - 2 wheels;Cane - single point;Crutches;Bedside commode;Adaptive equipment      Prior Function Level of Independence: Independent  Comments: Independent with ambulation.  Finished physical therapy for R TKA.  Has been sleeping in recliner since R TKA (pt reports LBP since last surgery and also has muscle spasms to R LE since last surgery).   PT Goals (current goals can now be found in the care plan section) Acute Rehab PT Goals Patient Stated Goal: to go home and have less pain Progress towards PT goals: Progressing toward goals     Frequency    BID      PT Plan Current plan remains appropriate    Co-evaluation PT/OT/SLP Co-Evaluation/Treatment: Yes Reason for Co-Treatment: For patient/therapist safety;To address functional/ADL transfers PT goals addressed during session: Mobility/safety with mobility;Balance;Proper use of DME OT goals addressed during session: Proper use of Adaptive equipment and DME;ADL's and self-care      AM-PAC PT "6 Clicks" Mobility   Outcome Measure  Help needed turning from your back to your side while in a flat bed without using bedrails?: A Lot Help needed moving from lying on your back to sitting on the side of a flat bed without using bedrails?: A Lot Help needed moving to and from a bed to a chair (including a wheelchair)?: A Little Help needed standing up from a chair using your arms (e.g., wheelchair or bedside chair)?: A Little Help needed to walk in hospital room?: A Little Help needed climbing 3-5 steps with a railing? : A Lot 6 Click Score: 15    End of Session Equipment Utilized During Treatment: Gait belt Activity Tolerance: Patient tolerated treatment well Patient left: in bed;with call bell/phone within reach;with chair alarm set;Other (comment) Nurse Communication: Mobility status;Weight bearing status Pain - Right/Left: Left Pain - part of body: Hip     Time: 3154-0086 PT Time Calculation (min) (ACUTE ONLY): 28 min  Charges:  $Gait Training: 8-22 mins $Therapeutic Activity: 8-22 mins                    Chesley Noon, PTA 01/30/19, 2:01 PM

## 2019-01-30 NOTE — Progress Notes (Signed)
   Subjective: 1 Day Post-Op Procedure(s) (LRB): TOTAL HIP ARTHROPLASTY ANTERIOR APPROACH (Left) Patient reports pain as mild.   Patient is well, and has had no acute complaints or problems Denies any CP, SOB, ABD pain. We will continue therapy today.  Plan is to go Home after hospital stay.  Objective: Vital signs in last 24 hours: Temp:  [97.3 F (36.3 C)-98.5 F (36.9 C)] 98.1 F (36.7 C) (10/09 0801) Pulse Rate:  [72-87] 78 (10/09 0801) Resp:  [15-26] 18 (10/09 0801) BP: (97-155)/(54-85) 120/56 (10/09 0801) SpO2:  [94 %-100 %] 99 % (10/09 0801) Weight:  [171.5 kg] 171.5 kg (10/08 1456)  Intake/Output from previous day: 10/08 0701 - 10/09 0700 In: 4516.2 [I.V.:2816.2; Blood:1500; IV Piggyback:200] Out: 6222 [Urine:1800; Drains:220; Blood:1900] Intake/Output this shift: No intake/output data recorded.  Recent Labs    01/29/19 1447 01/30/19 0444  HGB 11.2* 9.7*   Recent Labs    01/29/19 1447 01/30/19 0444  WBC 16.9* 16.4*  RBC 4.63 4.03  HCT 36.9 32.0*  PLT 301 310   Recent Labs    01/29/19 1447 01/30/19 0444  NA  --  134*  K  --  3.7  CL  --  101  CO2  --  27  BUN  --  13  CREATININE 0.63 0.59  GLUCOSE  --  155*  CALCIUM  --  8.3*   No results for input(s): LABPT, INR in the last 72 hours.  EXAM General - Patient is Alert, Appropriate and Oriented Extremity - Neurovascular intact Sensation intact distally Intact pulses distally Dorsiflexion/Plantar flexion intact No cellulitis present Compartment soft Dressing - dressing C/D/I and no drainage, Praveena intact without drainage.  Hemovac intact Motor Function - intact, moving foot and toes well on exam.   Past Medical History:  Diagnosis Date  . Appendicitis 08/16/2016  . Arthritis   . Atrial tachycardia, paroxysmal (HCC)    a. s/p ablation in 2009 by Dr. Elonda Husky in [facility] (falied); b. Managed w/ beta blocker.  Marland Kitchen GERD (gastroesophageal reflux disease)   . Hypertension   . LBBB (left  bundle branch block)    atrial tach.  . Obesity   . Sleep apnea    cpap    Assessment/Plan:   1 Day Post-Op Procedure(s) (LRB): TOTAL HIP ARTHROPLASTY ANTERIOR APPROACH (Left) Active Problems:   Status post total hip replacement, left  Estimated body mass index is 66.96 kg/m as calculated from the following:   Height as of this encounter: 5\' 3"  (1.6 m).   Weight as of this encounter: 171.5 kg. Advance diet Up with therapy  Needs BM Acute post op blood loss anemia - Hgb 9.7.  Start iron supplementation Recheck hemoglobin in the morning Remove Hemovac Saturday morning Care management to assist with discharge to home with home health PT  DVT Prophylaxis - Lovenox, Foot Pumps and TED hose Weight-Bearing as tolerated to left leg   T. Rachelle Hora, PA-C Omar 01/30/2019, 8:14 AM

## 2019-01-30 NOTE — TOC Initial Note (Signed)
Transition of Care [facility]) - Initial/Assessment Note    Patient Details  Name: [name] [name] MRN: [id number] Date of Birth: [birth year]  Transition [facility]) CM/SW Contact:    Doniel Maiello, Lenice Llamas Phone Number: [phone]  01/30/2019, 10:22 AM  Clinical Narrative: PT is recommending home health. Clinical Social Worker (CSW) is familiar with patient from previous admission in July 2020. CSW met with patient alone at bedside to discuss D/C plan. Patient was alert and oriented X4 and was laying in the bed. CSW introduced self and explained role of CSW department. Per patient she lives [place], [place] located in [place]. Patient reported that she lives with her husband [name] [name] and he can provide 24/7 supervision for her. Patient reported that her son [name] [name] is also coming home form college to assist her. Patient reported that she has a bariatric rolling walker and a bariatric bedside commode at home. Per patient she got her equipment in July 2020 when she had a knee replacement. Patient reported that she wants to use [facility] again for home health. Per Helene Kelp [facility] home health representative they can accept patient. CSW made patient aware that her Lovenox price is $0. Patient reported that she has her own c-pap form home at bedside. Patient reported no other needs or concerns. CSW will continue to follow and assist as needed.                   Expected Discharge Plan: [place] Barriers to Discharge: Continued Medical Work up   Patient Goals and CMS Choice Patient states their goals for this hospitalization and ongoing recovery are:: To go home. CMS Medicare.gov Compare Post Acute Care list provided to:: Patient Choice offered to / list presented to : Patient  Expected Discharge Plan and Services Expected Discharge Plan: [place] In-house Referral: Clinical Social Work   Post Acute Care Choice: [place] arrangements for the past 2  months: [place]                 DME Arranged: N/A         HH Arranged: PT [place] Agency: [facility] [employer] [facility] [employer] Date [place]: 01/29/19 Time [place]: 1627 Representative spoke with at [place]: [place] Arrangements/Services Living arrangements for the past 2 months: [place] Lives with:: Spouse Patient language and need for interpreter reviewed:: No Do you feel safe going back to the place where you live?: Yes      Need for Family Participation in Patient Care: No (Comment) Care giver support system in place?: Yes (comment) Current home services: DME(Patient has bariatric rolling walker abd bariatric bedside commode at home.) Criminal Activity/Legal Involvement Pertinent to Current Situation/Hospitalization: No - Comment as needed  Activities of Daily Living Home Assistive Devices/Equipment: None ADL Screening (condition at time of admission) Patient's cognitive ability adequate to safely complete daily activities?: Yes Is the patient deaf or have difficulty hearing?: No Does the patient have difficulty seeing, even when wearing glasses/contacts?: No Does the patient have difficulty concentrating, remembering, or making decisions?: No Patient able to express need for assistance with ADLs?: Yes Does the patient have difficulty dressing or bathing?: No Independently performs ADLs?: Yes (appropriate for developmental age) Does the patient have difficulty walking or climbing stairs?: Yes Weakness of Legs: Left Weakness of Arms/Hands: None  Permission Sought/Granted Permission sought to share information with : Other (comment)(Home Health agency)  Permission granted to share information with : Yes, Verbal Permission Granted              Emotional Assessment Appearance:: Appears stated age Attitude/Demeanor/Rapport: Engaged Affect (typically observed): Pleasant, Calm Orientation: : Oriented to  Self, Oriented to Place, Oriented to  Time, Oriented to Situation Alcohol / Substance Use: Not Applicable Psych Involvement: No (comment)  Admission diagnosis:  PRIMARY LOCALIZED OSTEOARTHRITIS LEFT HIP Patient Active Problem List   Diagnosis Date Noted  . Status post total hip replacement, left 01/29/2019  . Status post total knee replacement using cement, right 11/06/2018  . Sprain of ankle 11/20/2017  . Acute left-sided back pain 09/19/2017  . Encounter for biometric screening 09/19/2017  . Gastroesophageal reflux disease without esophagitis 09/19/2017  . Cyst of right ovary 07/03/2017  . DUB (dysfunctional uterine bleeding) 07/03/2017  . Vaginal mass 07/03/2017  . Osteoarthritis of knee 09/19/2016  . Sleep apnea   . Appendicitis, acute 08/16/2016  . Pain in joint, lower leg 01/12/2014  . Obesity   . Hypertension   . Atrial tachycardia, paroxysmal ([place])   . LBBB (left bundle branch block)    PCP:  Algis Greenhouse, MD Pharmacy:   [facility] [place], [place] - [address] AT [address] [place] [address] Phone: [phone] Fax: [phone]     Social Determinants of Health (SDOH) Interventions    Readmission Risk Interventions Readmission Risk Prevention Plan 11/08/2018  Post Dischage Appt Complete  Medication Screening Complete  Transportation Screening Complete  Some recent data might be hidden

## 2019-01-31 LAB — BASIC METABOLIC PANEL
Anion gap: 9 (ref 5–15)
BUN: 17 mg/dL (ref 6–20)
CO2: 26 mmol/L (ref 22–32)
Calcium: 8.2 mg/dL — ABNORMAL LOW (ref 8.9–10.3)
Chloride: 101 mmol/L (ref 98–111)
Creatinine, Ser: 0.54 mg/dL (ref 0.44–1.00)
GFR calc Af Amer: >60 mL/min (ref >60)
GFR calc non Af Amer: >60 mL/min (ref >60)
Glucose, Bld: 106 mg/dL — ABNORMAL HIGH (ref 70–99)
Potassium: 3.9 mmol/L (ref 3.5–5.1)
Sodium: 136 mmol/L (ref 135–145)

## 2019-01-31 LAB — CBC
HCT: 29.7 % — ABNORMAL LOW (ref 36.0–46.0)
Hemoglobin: 8.9 g/dL — ABNORMAL LOW (ref 12.0–15.0)
MCH: 24.1 pg — ABNORMAL LOW (ref 26.0–34.0)
MCHC: 30 g/dL (ref 30.0–36.0)
MCV: 80.5 fL (ref 80.0–100.0)
Platelets: 265 10*3/uL (ref 150–400)
RBC: 3.69 MIL/uL — ABNORMAL LOW (ref 3.87–5.11)
RDW: 15.7 % — ABNORMAL HIGH (ref 11.5–15.5)
WBC: 10.9 10*3/uL — ABNORMAL HIGH (ref 4.0–10.5)
nRBC: 0.2 % (ref 0.0–0.2)

## 2019-01-31 NOTE — Progress Notes (Signed)
  Subjective: 2 Days Post-Op Procedure(s) (LRB): TOTAL HIP ARTHROPLASTY ANTERIOR APPROACH (Left) Patient reports pain as Well-controlled.   Patient is well, and has had no acute complaints or problems Plan is to go Home after hospital stay. Negative for chest pain and shortness of breath Fever: no Gastrointestinal: Negative for nausea and vomiting  Objective: Vital signs in last 24 hours: Temp:  [97.5 F (36.4 C)-98.6 F (37 C)] 98.4 F (36.9 C) (10/10 0753) Pulse Rate:  [76-84] 82 (10/10 0753) Resp:  [16-18] 16 (10/09 2326) BP: (112-142)/(54-77) 121/59 (10/10 0753) SpO2:  [98 %-100 %] 98 % (10/10 0753)  Intake/Output from previous day:  Intake/Output Summary (Last 24 hours) at 01/31/2019 0900 Last data filed at 01/31/2019 0615 Gross per 24 hour  Intake 854.97 ml  Output 610 ml  Net 244.97 ml    Intake/Output this shift: No intake/output data recorded.  Labs: Recent Labs    01/29/19 1447 01/30/19 0444 01/31/19 0500  HGB 11.2* 9.7* 8.9*   Recent Labs    01/30/19 0444 01/31/19 0500  WBC 16.4* 10.9*  RBC 4.03 3.69*  HCT 32.0* 29.7*  PLT 310 265   Recent Labs    01/30/19 0444 01/31/19 0500  NA 134* 136  K 3.7 3.9  CL 101 101  CO2 27 26  BUN 13 17  CREATININE 0.59 0.54  GLUCOSE 155* 106*  CALCIUM 8.3* 8.2*   No results for input(s): LABPT, INR in the last 72 hours.   EXAM General - Patient is Alert, Appropriate and Oriented Extremity - Neurovascular intact Dorsiflexion/Plantar flexion intact Compartment soft Dressing/Incision -wound VAC remains in place.  Drain remains in place. Motor Function - intact, moving foot and toes well on exam.   Assessment/Plan: 2 Days Post-Op Procedure(s) (LRB): TOTAL HIP ARTHROPLASTY ANTERIOR APPROACH (Left) Active Problems:   Status post total hip replacement, left  Estimated body mass index is 66.96 kg/m as calculated from the following:   Height as of this encounter: 5\' 3"  (1.6 m).   Weight as of this  encounter: 171.5 kg. Advance diet Up with therapy Plan for discharge tomorrow  DVT Prophylaxis - Lovenox, foot pumps Limited weightbearing to left leg.   Cassell Smiles, PA-C [facility] Orthopaedic Surgery 01/31/2019, 9:00 AM

## 2019-01-31 NOTE — Progress Notes (Signed)
Physical Therapy Treatment Patient Details Name: [name] [name] MRN: [id number] DOB: 26-Apr-1970 Today's Date: 01/31/2019    History of Present Illness Pt is a 48 y.o. female s/p L THA anterior approach 01/29/19.  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    PT Comments    Pt in bed, continues with overall increased pain and discomfort this pm.  Had returned to bed before lunch with nursing staff.  Agrees to gait as she needed to use bathroom.  To edge of bed with assist for LLE.  Once sitting, she stands slowly and is able to walk with increased time to bathroom and back to void.  Gait remains steady with no LOB or buckling despite increased pain.  She continues to maintain PWB well with gait and reports pain is muscular in nature.    Returned to bed with mod a x 1 for LE (sleeps in recliner at home).    Discussed discharge plan.  Pt does have 4 steps into home with bilateral rails.  Dr. Merrilee Seashore WBAT for step training.     Follow Up Recommendations  Home health PT     Equipment Recommendations  Rolling walker with 5" wheels;3in1 (PT)    Recommendations for Other Services       Precautions / Restrictions Precautions Precautions: Fall;Anterior Hip Precaution Comments: wound vac Restrictions Weight Bearing Restrictions: Yes LLE Weight Bearing: Partial weight bearing LLE Partial Weight Bearing Percentage or Pounds: 50% Other Position/Activity Restrictions: Discussed with primary PT on site regarding stair training and concern over her ability to [facility] with step training.  PM to Dr. Rudene Christians.  OK for WBAT for stair training only.    Mobility  Bed Mobility Overal bed mobility: Needs Assistance Bed Mobility: Sit to Supine;Supine to Sit     Supine to sit: Min assist Sit to supine: Mod assist   General bed mobility comments: for LLE  Transfers Overall transfer level: Needs assistance Equipment used:  Rolling walker (2 wheeled) Transfers: Sit to/from Stand Sit to Stand: Min guard            Ambulation/Gait Ambulation/Gait assistance: Min guard Gait Distance (Feet): 15 Feet Assistive device: Rolling walker (2 wheeled) Gait Pattern/deviations: Step-to pattern Gait velocity: decreased   General Gait Details: PWB maintained LLE - limited due to inc pain and spasms today.  to and from bathroom with significanlty increased time this session.   Stairs             Wheelchair Mobility    Modified Rankin (Stroke Patients Only)       Balance Overall balance assessment: Needs assistance Sitting-balance support: No upper extremity supported;Feet supported Sitting balance-Leahy Scale: Good     Standing balance support: Bilateral upper extremity supported Standing balance-Leahy Scale: Good Standing balance comment: steady with BUE support.                            Cognition Arousal/Alertness: Awake/alert Behavior During Therapy: WFL for tasks assessed/performed Overall Cognitive Status: Within Functional Limits for tasks assessed                                        Exercises Total Joint Exercises Ankle Circles/Pumps: AROM;Strengthening;Both;10 reps;Supine Quad Sets: AROM;Strengthening;Both;10 reps;Supine Gluteal Sets: AROM;Strengthening;Both;10 reps;Supine Towel Squeeze: AROM;Strengthening;Both;10 reps;Supine Short Arc Quad: AAROM;Strengthening;Left;10 reps;Supine  Heel Slides: AAROM;Strengthening;Left;10 reps;Supine Hip ABduction/ADduction: AAROM;Strengthening;Left;10 reps;Supine Other Exercises Other Exercises: to commode to void    General Comments        Pertinent Vitals/Pain Pain Assessment: 0-10 Pain Score: 9  Faces Pain Scale: Hurts even more Pain Location: continues with inc pain this pm despite pain medication as scheduled. Pain Descriptors / Indicators: Sore;Aching Pain Intervention(s): Limited activity within  patient's tolerance;Monitored during session;Premedicated before session;Repositioned    Home Living                      Prior Function            PT Goals (current goals can now be found in the care plan section) Progress towards PT goals: Progressing toward goals    Frequency    BID      PT Plan Current plan remains appropriate    Co-evaluation              AM-PAC PT "6 Clicks" Mobility   Outcome Measure  Help needed turning from your back to your side while in a flat bed without using bedrails?: A Lot Help needed moving from lying on your back to sitting on the side of a flat bed without using bedrails?: A Lot Help needed moving to and from a bed to a chair (including a wheelchair)?: A Little Help needed standing up from a chair using your arms (e.g., wheelchair or bedside chair)?: A Little Help needed to walk in hospital room?: A Little Help needed climbing 3-5 steps with a railing? : A Lot 6 Click Score: 15    End of Session Equipment Utilized During Treatment: Gait belt Activity Tolerance: Patient tolerated treatment well Patient left: in bed;with call bell/phone within reach;with chair alarm set;Other (comment) Nurse Communication: Mobility status;Weight bearing status Pain - Right/Left: Left Pain - part of body: Hip     Time: 1552-0802 PT Time Calculation (min) (ACUTE ONLY): 28 min  Charges:  $Gait Training: 8-22 mins $Therapeutic Exercise: 8-22 mins $Therapeutic Activity: 8-22 mins                    Danielle Dess, PTA 01/31/19, 3:46 PM

## 2019-01-31 NOTE — Progress Notes (Signed)
Physical Therapy Treatment Patient Details Name: [name] [name] MRN: [id number] DOB: Jul 12, 1970 Today's Date: 01/31/2019    History of Present Illness Pt is a 48 y.o. female s/p L THA anterior approach 01/29/19.  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    PT Comments    C/o inc pain and spasms today.  Premedicated. Participated in exercises as described below.  To edge of bed and was able to walk to bathroom to void then continue just outside room door before returning to recliner.  Increased pain and stiffness today and unable to progress gait this am or complete stair training.  Remained in recliner after session.   Follow Up Recommendations        Equipment Recommendations  Rolling walker with 5" wheels;3in1 (PT)    Recommendations for Other Services       Precautions / Restrictions Precautions Precautions: Fall;Anterior Hip Precaution Comments: wound vac Restrictions Weight Bearing Restrictions: Yes LLE Weight Bearing: Partial weight bearing LLE Partial Weight Bearing Percentage or Pounds: 50%    Mobility  Bed Mobility Overal bed mobility: Needs Assistance Bed Mobility: Sit to Supine       Sit to supine: Min assist   General bed mobility comments: for LLE  Transfers Overall transfer level: Needs assistance Equipment used: Rolling walker (2 wheeled) Transfers: Sit to/from Stand Sit to Stand: Min guard            Ambulation/Gait Ambulation/Gait assistance: Counsellor (Feet): 45 Feet Assistive device: Rolling walker (2 wheeled) Gait Pattern/deviations: Step-to pattern Gait velocity: decreased   General Gait Details: PWB maintained LLE - limited due to inc pain and spasms today.   Stairs             Wheelchair Mobility    Modified Rankin (Stroke Patients Only)       Balance Overall balance assessment: Needs assistance Sitting-balance support: No  upper extremity supported;Feet supported Sitting balance-Leahy Scale: Good     Standing balance support: Bilateral upper extremity supported Standing balance-Leahy Scale: Good Standing balance comment: steady with BUE support.                            Cognition Arousal/Alertness: Awake/alert Behavior During Therapy: WFL for tasks assessed/performed Overall Cognitive Status: Within Functional Limits for tasks assessed                                        Exercises Total Joint Exercises Ankle Circles/Pumps: AROM;Strengthening;Both;10 reps;Supine Quad Sets: AROM;Strengthening;Both;10 reps;Supine Gluteal Sets: AROM;Strengthening;Both;10 reps;Supine Towel Squeeze: AROM;Strengthening;Both;10 reps;Supine Short Arc Quad: AAROM;Strengthening;Left;10 reps;Supine Heel Slides: AAROM;Strengthening;Left;10 reps;Supine Hip ABduction/ADduction: AAROM;Strengthening;Left;10 reps;Supine Other Exercises Other Exercises: to commode to void    General Comments        Pertinent Vitals/Pain Pain Assessment: Faces Faces Pain Scale: Hurts even more Pain Location: L hip- at rest, increases with movement, some inc knee pain with ext in recliner. Pain Descriptors / Indicators: Sore;Aching Pain Intervention(s): Limited activity within patient's tolerance;Monitored during session;Premedicated before session    Home Living                      Prior Function            PT Goals (current goals can now be found in the care plan section) Progress towards  PT goals: Progressing toward goals    Frequency    BID      PT Plan Current plan remains appropriate    Co-evaluation              AM-PAC PT "6 Clicks" Mobility   Outcome Measure  Help needed turning from your back to your side while in a flat bed without using bedrails?: A Lot Help needed moving from lying on your back to sitting on the side of a flat bed without using bedrails?: A Lot Help  needed moving to and from a bed to a chair (including a wheelchair)?: A Little Help needed standing up from a chair using your arms (e.g., wheelchair or bedside chair)?: A Little Help needed to walk in hospital room?: A Little Help needed climbing 3-5 steps with a railing? : A Lot 6 Click Score: 15    End of Session Equipment Utilized During Treatment: Gait belt Activity Tolerance: Patient tolerated treatment well Patient left: in bed;with call bell/phone within reach;with chair alarm set;Other (comment) Nurse Communication: Mobility status;Weight bearing status Pain - Right/Left: Left Pain - part of body: Hip     Time: 1962-2297 PT Time Calculation (min) (ACUTE ONLY): 25 min  Charges:  $Gait Training: 8-22 mins $Therapeutic Exercise: 8-22 mins                    Danielle Dess, PTA 01/31/19, 3:39 PM

## 2019-02-01 NOTE — Progress Notes (Signed)
  Subjective: 3 Days Post-Op Procedure(s) (LRB): TOTAL HIP ARTHROPLASTY ANTERIOR APPROACH (Left) Patient reports pain as  [Well controlled].   Patient is [well, and has had no acute complaints or problems] Plan is to go [Home] after hospital stay. Negative for chest pain and shortness of breath Fever: [no] Gastrointestinal: [negative] for nausea and vomiting.  [Patient has had a bowel movement.]  Objective: Vital signs in last 24 hours: Temp:  [97.7 F (36.5 C)-99.6 F (37.6 C)] 97.7 F (36.5 C) (10/11 0711) Pulse Rate:  [84-88] 85 (10/11 0711) Resp:  [17-21] 17 (10/11 0711) BP: (130-148)/(54-65) 130/65 (10/11 0711) SpO2:  [96 %-99 %] 98 % (10/11 0711)  Intake/Output from previous day:  Intake/Output Summary (Last 24 hours) at 02/01/2019 1104 Last data filed at 02/01/2019 0900 Gross per 24 hour  Intake 240 ml  Output -  Net 240 ml    Intake/Output this shift: No intake/output data recorded.  Labs: Recent Labs    01/29/19 1447 01/30/19 0444 01/31/19 0500  HGB 11.2* 9.7* 8.9*   Recent Labs    01/30/19 0444 01/31/19 0500  WBC 16.4* 10.9*  RBC 4.03 3.69*  HCT 32.0* 29.7*  PLT 310 265   Recent Labs    01/30/19 0444 01/31/19 0500  NA 134* 136  K 3.7 3.9  CL 101 101  CO2 27 26  BUN 13 17  CREATININE 0.59 0.54  GLUCOSE 155* 106*  CALCIUM 8.3* 8.2*   No results for input(s): LABPT, INR in the last 72 hours.   EXAM General - Patient is Print production planner, Appropriate and Oriented] Extremity - [Neurovascular intact Dorsiflexion/Plantar flexion intact Compartment soft] Dressing/Incision - [wound VAC in place.] Motor Function - intact, moving foot and toes well on exam.     Assessment/Plan: 3 Days Post-Op Procedure(s) (LRB): TOTAL HIP ARTHROPLASTY ANTERIOR APPROACH (Left) Active Problems:   Status post total hip replacement, left  Estimated body mass index is 66.96 kg/m as calculated from the following:   Height as of this encounter: 5\' 3"  (1.6 m).   Weight  as of this encounter: 171.5 kg. [Advance diet Up with therapy Plan for discharge tomorrow].  Patient is to go home.  DVT Prophylaxis - [Lovenox], foot pumps, TED hose 20% weightbearing to the left leg.  Patient may weight-bear as tolerated when performing stairs. Cassell Smiles, PA-C [facility] Orthopaedic Surgery 02/01/2019, 11:04 AM

## 2019-02-01 NOTE — Progress Notes (Signed)
Physical Therapy Treatment Patient Details Name: [name] [name] MRN: [id number] DOB: 1970/08/26 Today's Date: 02/01/2019    History of Present Illness Pt is a 48 y.o. female s/p L THA anterior approach 01/29/19.  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    PT Comments    Patient is assisted in transfer from bed to commode with CNA and MI and RW.  Patient reports being assisted off the commode with CNA and PT finds patient seated on the bed waiting for PT. She transfers sit to stand with MI and BRW. She ambulates with decreased gait speed and BRW 60 feet. She reports that her left leg is fatigued after 30 feet and it is determined that she is not able to attempt the steps today. She returns to the room and is assisted on and off the commode with MI and then back to bed with min assist for LLE. She will continue to benefit from skilled PT to improve mobility and strength.    Follow Up Recommendations  Home health PT     Equipment Recommendations  (BRW)    Recommendations for Other Services       Precautions / Restrictions Precautions Precautions: Fall;Anterior Hip Precaution Comments: wound vac Restrictions Weight Bearing Restrictions: Yes LLE Weight Bearing: Partial weight bearing LLE Partial Weight Bearing Percentage or Pounds: 50%    Mobility  Bed Mobility Overal bed mobility: Needs Assistance Bed Mobility: Supine to Sit;Sit to Supine     Supine to sit: Min guard Sit to supine: Min assist   General bed mobility comments: LLE  Transfers Overall transfer level: Modified independent Equipment used: Rolling walker (2 wheeled) Transfers: Sit to/from Stand Sit to Stand: Min guard(needs bed raised)         General transfer comment: Patient needs bed raised  Ambulation/Gait Ambulation/Gait assistance: Modified independent (Device/Increase time) Gait Distance (Feet): 50 Feet Assistive device:  Standard walker Gait Pattern/deviations: Step-to pattern Gait velocity: (decreased)   General Gait Details: PWB   Stairs Stairs: (Patient reports fatigue in LLE during ambulation,)           Wheelchair Mobility    Modified Rankin (Stroke Patients Only)       Balance Overall balance assessment: Modified Independent Sitting-balance support: No upper extremity supported Sitting balance-Leahy Scale: Good     Standing balance support: Bilateral upper extremity supported Standing balance-Leahy Scale: Good                              Cognition Arousal/Alertness: Awake/alert Behavior During Therapy: WFL for tasks assessed/performed Overall Cognitive Status: Within Functional Limits for tasks assessed                                        Exercises Other Exercises Other Exercises: to commode to void x 2     General Comments        Pertinent Vitals/Pain Pain Assessment: 0-10 Pain Score: 4  Pain Location: (left hip) Pain Descriptors / Indicators: Aching Pain Intervention(s): Monitored during session    Home Living                      Prior Function            PT Goals (current goals can now be found in the care  plan section) Acute Rehab PT Goals Patient Stated Goal: to go home PT Goal Formulation: With patient Time For Goal Achievement: 02/15/19 Potential to Achieve Goals: Good Progress towards PT goals: Progressing toward goals    Frequency    BID      PT Plan Current plan remains appropriate    Co-evaluation              AM-PAC PT "6 Clicks" Mobility   Outcome Measure  Help needed turning from your back to your side while in a flat bed without using bedrails?: A Little Help needed moving from lying on your back to sitting on the side of a flat bed without using bedrails?: A Little Help needed moving to and from a bed to a chair (including a wheelchair)?: A Little Help needed standing up from a  chair using your arms (e.g., wheelchair or bedside chair)?: A Lot Help needed to walk in hospital room?: A Lot Help needed climbing 3-5 steps with a railing? : A Little 6 Click Score: 16    End of Session Equipment Utilized During Treatment: Gait belt Activity Tolerance: Patient limited by fatigue Patient left: in bed;with call bell/phone within reach Nurse Communication: (mobiltiy status) Pain - Right/Left: Left Pain - part of body: Hip     Time: 2831-5176 PT Time Calculation (min) (ACUTE ONLY): 60 min  Charges:  $Gait Training: 23-37 mins $Therapeutic Activity: 23-37 mins                        Alanson Puls, PT DPT 02/01/2019, 12:52 PM

## 2019-02-02 NOTE — Progress Notes (Signed)
Physical Therapy Treatment Patient Details Name: [name] [name] MRN: [id number] DOB: May 01, 1970 Today's Date: 02/02/2019    History of Present Illness Pt is a 48 y.o. female s/p L THA anterior approach 01/29/19.  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    PT Comments    Pt agreeable to PT; reports 7/10 pain in L hip. BP checked due to low read this morning; pt denies symptoms. Current reading 145/79. Pt demonstrates Min A for supine to/from sit for LLE management. STS with bariatric rolling walker with Mod I to/from bed and BSC. Pt progressing ambulation to 100 ft with seated rest mid way pre and post stair training; Min guard and slow pace. Pt educated on stair climbing with demonstration 2x; pt nervous and agreeable to attempt with +2 assist. Pt negotiates up/down with sideways stepping using L rail ascending and same rail descending with Min A of 1 and Min guard of another and sequence cues. Pt with improved confidence once completed (R knee with prior knee replacement 11 weeks ago demonstrated good strength with no buckling. Pt able to maintain PWB throughout ambulation/stair activity with good use of UEs and partial tandem stance on steps (L posterolateral to R). Pt prepared to discharge home to continue rehab with HHPT.   Follow Up Recommendations  Home health PT     Equipment Recommendations  Rolling walker with 5" wheels;3in1 (PT)    Recommendations for Other Services       Precautions / Restrictions Precautions Precautions: Fall;Anterior Hip Precaution Comments: wound vac Restrictions Weight Bearing Restrictions: Yes LLE Weight Bearing: Partial weight bearing LLE Partial Weight Bearing Percentage or Pounds: 50    Mobility  Bed Mobility Overal bed mobility: Needs Assistance Bed Mobility: Supine to Sit;Sit to Supine     Supine to sit: Min assist Sit to supine: Min assist   General bed mobility  comments: for LLE in/out of bed  Transfers Overall transfer level: Needs assistance Equipment used: Rolling walker (2 wheeled) Transfers: Sit to/from Stand Sit to Stand: Min guard;From elevated surface         General transfer comment: Uses B hands on rw for all STS transfers; does reach back for stand to sit. Effortful, but does not require physical assist to manage. Demonstrates to/from bed and [facility]  Ambulation/Gait Ambulation/Gait assistance: Min guard Gait Distance (Feet): 100 Feet(seated rest half way prior to and post steps) Assistive device: Rolling walker (2 wheeled)(bariatric) Gait Pattern/deviations: Step-to pattern Gait velocity: decreased   General Gait Details: PWB   Stairs Stairs: Yes Stairs assistance: Min assist;+2 safety/equipment Stair Management: One rail Left;Sideways;Step to pattern Number of Stairs: 3 General stair comments: Pt initially with fear; plus to for safety and to ease fear. Improved confidence with successful management.    Wheelchair Mobility    Modified Rankin (Stroke Patients Only)       Balance Overall balance assessment: Needs assistance Sitting-balance support: No upper extremity supported Sitting balance-Leahy Scale: Good Sitting balance - Comments: Steady static/dynamic sitting. Able to reach outside BOS for mgt of materials during use of BSC. Good weight shift for peri-care with no LOB.   Standing balance support: Bilateral upper extremity supported Standing balance-Leahy Scale: Fair Standing balance comment: steady with BUE support.                            Cognition Arousal/Alertness: Awake/alert Behavior During Therapy: WFL for tasks assessed/performed Overall  Cognitive Status: Within Functional Limits for tasks assessed                                        Exercises Other Exercises Other Exercises: set up assist and supervision toileting at [facility] Other Exercises: educated on anterior  hip precautions and PWB    General Comments General comments (skin integrity, edema, etc.): wound vac in place at start/end of session      Pertinent Vitals/Pain Pain Assessment: 0-10 Pain Score: 7  Pain Location: LLE/B Thighs Pain Descriptors / Indicators: Aching;Constant;Cramping Pain Intervention(s): Monitored during session    Home Living                      Prior Function            PT Goals (current goals can now be found in the care plan section) Acute Rehab PT Goals Patient Stated Goal: to go home Progress towards PT goals: Progressing toward goals    Frequency    BID      PT Plan Current plan remains appropriate    Co-evaluation              AM-PAC PT "6 Clicks" Mobility   Outcome Measure  Help needed turning from your back to your side while in a flat bed without using bedrails?: A Little Help needed moving from lying on your back to sitting on the side of a flat bed without using bedrails?: A Little Help needed moving to and from a bed to a chair (including a wheelchair)?: A Little Help needed standing up from a chair using your arms (e.g., wheelchair or bedside chair)?: A Little Help needed to walk in hospital room?: A Little Help needed climbing 3-5 steps with a railing? : A Little 6 Click Score: 18    End of Session Equipment Utilized During Treatment: Gait belt Activity Tolerance: Patient tolerated treatment well Patient left: in bed;with call bell/phone within reach;with bed alarm set;with SCD's reapplied   PT Visit Diagnosis: Other abnormalities of gait and mobility (R26.89);Muscle weakness (generalized) (M62.81);Difficulty in walking, not elsewhere classified (R26.2);Pain Pain - Right/Left: Left Pain - part of body: Hip     Time: 1050-1141 PT Time Calculation (min) (ACUTE ONLY): 51 min  Charges:  $Gait Training: 23-37 mins $Therapeutic Activity: 8-22 mins                      Scot Dock, PTA 02/02/2019, 12:45  PM

## 2019-02-02 NOTE — Discharge Instructions (Signed)

## 2019-02-02 NOTE — Progress Notes (Signed)
   Subjective: 4 Days Post-Op Procedure(s) (LRB): TOTAL HIP ARTHROPLASTY ANTERIOR APPROACH (Left) Patient reports pain as mild.   Patient is well, and has had no acute complaints or problems Denies any CP, SOB, ABD pain. We will continue therapy today.  Plan is to go Home after hospital stay.  Objective: Vital signs in last 24 hours: Temp:  [97.8 F (36.6 C)-98.7 F (37.1 C)] 98.1 F (36.7 C) (10/12 0800) Pulse Rate:  [82-89] 82 (10/12 0800) Resp:  [17-19] 18 (10/12 0800) BP: (112-150)/(48-64) 112/48 (10/12 0800) SpO2:  [99 %] 99 % (10/12 0800)  Intake/Output from previous day: 10/11 0701 - 10/12 0700 In: 480 [P.O.:480] Out: 0  Intake/Output this shift: No intake/output data recorded.  Recent Labs    01/31/19 0500  HGB 8.9*   Recent Labs    01/31/19 0500  WBC 10.9*  RBC 3.69*  HCT 29.7*  PLT 265   Recent Labs    01/31/19 0500  NA 136  K 3.9  CL 101  CO2 26  BUN 17  CREATININE 0.54  GLUCOSE 106*  CALCIUM 8.2*   No results for input(s): LABPT, INR in the last 72 hours.  EXAM General - Patient is Alert, Appropriate and Oriented Extremity - Neurovascular intact Sensation intact distally Intact pulses distally Dorsiflexion/Plantar flexion intact No cellulitis present Compartment soft Dressing - dressing C/D/I and no drainage, Praveena intact without drainage.  Motor Function - intact, moving foot and toes well on exam.   Past Medical History:  Diagnosis Date  . Appendicitis 08/16/2016  . Arthritis   . Atrial tachycardia, paroxysmal (HCC)    a. s/p ablation in 2009 by Dr. Elonda Husky in [facility] (falied); b. Managed w/ beta blocker.  Marland Kitchen GERD (gastroesophageal reflux disease)   . Hypertension   . LBBB (left bundle branch block)    atrial tach.  . Obesity   . Sleep apnea    cpap    Assessment/Plan:   4 Days Post-Op Procedure(s) (LRB): TOTAL HIP ARTHROPLASTY ANTERIOR APPROACH (Left) Active Problems:   Status post total hip replacement,  left  Estimated body mass index is 66.96 kg/m as calculated from the following:   Height as of this encounter: 5\' 3"  (1.6 m).   Weight as of this encounter: 171.5 kg. Advance diet Up with therapy, 50% weightbearing left lower extremity Care management to assist with discharge to home with home health PT today  DVT Prophylaxis - Lovenox, Foot Pumps and TED hose 50% weight bearing left lower extremity   T. Rachelle Hora, PA-C [place] 02/02/2019, 8:01 AM

## 2019-02-02 NOTE — Progress Notes (Signed)
Discharge instructions and prescriptions reviewed with patient. Time allowed for questions. Verbalization of understanding received. IV discontinued without difficulty. Patient awaiting son's arrival for pick-up

## 2019-02-02 NOTE — Plan of Care (Signed)
Patient met goals for discharge home with home health.

## 2019-02-02 NOTE — Progress Notes (Signed)
Occupational Therapy Treatment Patient Details Name: [name] [name] MRN: [id number] DOB: May 20, 1970 Today's Date: 02/02/2019    History of present illness Pt is a 48 y.o. female s/p L THA anterior approach 01/29/19.  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   OT comments  [name] [name] was seen for OT treatment on this date. Upon arrival to room pt awake/alert, semi-supine in bed with RN in room administering pain medication. Pt reporting 5/10 pain and agreeable to OT tx this date. Pt and provider problem solved strategies for ADL mgt, and discussed adaptive equipment options for supporting LB dressing and bathing tasks. Pt verbalized concerns about being able to get into her small bathroom with her rolling walker. She also expressed concerns about having her son (who is now in town to assist) need to manage her BSC bucket. Pt and provider discussed routines modifications to support safety and functional independence while maximizing pt satisfaction with toileting routines upon return home. Pt verbalized plan to discuss [facility] mgt with her son and husband as this appears to be the safest option for her immediately after surgery. Pt then practiced [facility] transfer with this therapist. She is demonstrating significant improvement with functional mobility and independently manages bed set-up to complete bed mobility and STS transfers this date. OT provided set-up/supervision assist during functional transfer to [facility]. Pt able to manage peri-care independently but requested assistance with mgt of sanitary pad this date. OT provided min assist with feminine hygrine supply mgt. Pt making good progress toward goals and continues to benefit from skilled OT services to maximize return to PLOF and minimize risk of future falls, injury, caregiver burden, and readmission. Will continue to follow POC. Discharge recommendation remains appropriate.    Follow  Up Recommendations  No OT follow up    Equipment Recommendations       Recommendations for Other Services      Precautions / Restrictions Precautions Precautions: Fall;Anterior Hip Precaution Comments: wound vac Restrictions Weight Bearing Restrictions: Yes LLE Weight Bearing: Partial weight bearing LLE Partial Weight Bearing Percentage or Pounds: 50       Mobility Bed Mobility Overal bed mobility: Needs Assistance Bed Mobility: Supine to Sit;Sit to Supine     Supine to sit: Supervision;Modified independent (Device/Increase time) Sit to supine: Min assist   General bed mobility comments: Min a for mgt of LLE into bed during sit>sup this date. Pt manages bed and sets up for transfer independently with good safety judgement and no assist from therapist for sup>sit.  Transfers Overall transfer level: Modified independent Equipment used: Rolling walker (2 wheeled) Transfers: Sit to/from Stand Sit to Stand: Supervision         General transfer comment: Pt independently manages bed positioning to complete STS transfer.    Balance Overall balance assessment: Modified Independent Sitting-balance support: No upper extremity supported Sitting balance-Leahy Scale: Good Sitting balance - Comments: Steady static/dynamic sitting. Able to reach outside BOS for mgt of materials during use of BSC. Good weight shift for peri-care with no LOB.   Standing balance support: Bilateral upper extremity supported Standing balance-Leahy Scale: Fair Standing balance comment: steady with BUE support.                           ADL either performed or assessed with clinical judgement   ADL  General ADL Comments: Pt continues to require min-mod A for LB ADL mgt. On this date, she was able to ambulate to [facility] using 2ww with CGA for STS to<>from BSC. Pt completes peri-care independently with lateral leans, but requires min asist  for placement of feminine pad. Pt reports she purchased a LH shoe horn/dressing stick tool to support LB ADL mgt at home. She states she has been using a sock aid successfully since her recent knee replacement and feels confident utilizing this AE.     Vision Baseline Vision/History: Wears glasses Wears Glasses: At all times Patient Visual Report: No change from baseline     Perception     Praxis      Cognition Arousal/Alertness: Awake/alert Behavior During Therapy: WFL for tasks assessed/performed Overall Cognitive Status: Within Functional Limits for tasks assessed                                          Exercises Other Exercises Other Exercises: Pt assisted to [facility] this date. OT provides set-up/supervision as well as min assist fo mgt of feminine hygine materials. Other Exercises: Pt and provider discussed strategies for ADL mgt as well as AE options to support functional independence. Pt verbalizes understanding of education provided and reports having purchased a LH reacher which should be available to her upon return home. Pt confident in abilities to use AE and has become familiar with compensatory dressing strategies since knee surgery.   Shoulder Instructions       General Comments wound vac in place at start/end of session    Pertinent Vitals/ Pain       Pain Score: 5  Pain Location: LLE Pain Descriptors / Indicators: Aching;Sore Pain Intervention(s): Limited activity within patient's tolerance;Monitored during session;Repositioned;Premedicated before session  Home Living                                          Prior Functioning/Environment              Frequency  Min 2X/week        Progress Toward Goals  OT Goals(current goals can now be found in the care plan section)  Progress towards OT goals: Progressing toward goals  Acute Rehab OT Goals Patient Stated Goal: to go home OT Goal Formulation: With  patient Time For Goal Achievement: 02/13/19 Potential to Achieve Goals: Good  Plan Discharge plan remains appropriate;Frequency remains appropriate    Co-evaluation                 AM-PAC OT "6 Clicks" Daily Activity     Outcome Measure   Help from another person eating meals?: None Help from another person taking care of personal grooming?: None Help from another person toileting, which includes using toliet, bedpan, or urinal?: A Little Help from another person bathing (including washing, rinsing, drying)?: A Little Help from another person to put on and taking off regular upper body clothing?: None Help from another person to put on and taking off regular lower body clothing?: A Little 6 Click Score: 21    End of Session Equipment Utilized During Treatment: Gait belt;Rolling walker  OT Visit Diagnosis: Other abnormalities of gait and mobility (R26.89);Pain Pain - Right/Left: Left Pain - part of body: Hip;Knee   Activity Tolerance Patient  tolerated treatment well   Patient Left in bed;with bed alarm set;with SCD's reapplied;with call bell/phone within reach   Nurse Communication          Time: [id number] OT Time Calculation (min): 32 min  Charges: OT General Charges $OT Visit: 1 Visit OT Treatments $Self Care/Home Management : 23-37 mins  Rockney GheeSerenity Somaly Marteney, M.S., OTR/L Ascom: [phone] 02/02/19, 10:44 AM

## 2019-02-02 NOTE — TOC Transition Note (Signed)
Transition of Care [facility]) - CM/SW Discharge Note   Patient Details  Name: [name] [name] MRN: [id number] Date of Birth: 11-04-1970  Transition of Care [facility] CM/SW Contact:  Thadeus Gandolfi, Lenice Llamas Phone Number: [phone]  02/02/2019, 9:32 AM   Clinical Narrative: Clinical Social Worker (CSW) notified Helene Kelp [facility] home health representative that patient will D/C home today. Patient has a bariatric rolling walker and a bariatric bedside commode at home from her previous surgery in July 2020. Patient is aware that her Lovenox price is $0. Please reconsult if future social work needs arise. CSW signing off.       Final next level of care: [place] Barriers to Discharge: Barriers Resolved   Patient Goals and CMS Choice Patient states their goals for this hospitalization and ongoing recovery are:: To go home. CMS Medicare.gov Compare Post Acute Care list provided to:: Patient Choice offered to / list presented to : Patient  Discharge Placement                       Discharge Plan and Services In-house Referral: Clinical Social Work   Post Acute Care Choice: Home Health          DME Arranged: N/A         HH Arranged: PT [place] Agency: [facility] (formerly [employer]) Date [place]: 02/02/19 Time [place]: [phone] Representative spoke with at [place]: [place] ([place]) Interventions     Readmission Risk Interventions Readmission Risk Prevention Plan 11/08/2018  Post Dischage Appt Complete  Medication Screening Complete  Transportation Screening Complete  Some recent data might be hidden

## 2019-02-13 ENCOUNTER — Ambulatory Visit: Payer: Managed Care, Other (non HMO) | Admitting: Adult Health

## 2019-02-13 ENCOUNTER — Other Ambulatory Visit: Payer: Self-pay

## 2019-02-13 ENCOUNTER — Encounter: Payer: Self-pay | Admitting: Adult Health

## 2019-02-13 VITALS — BP 140/82 | HR 91 | Temp 97.8°F | Resp 20 | Ht 63.0 in | Wt 372.0 lb

## 2019-02-13 DIAGNOSIS — Z008 Encounter for other general examination: Secondary | ICD-10-CM | POA: Diagnosis not present

## 2019-02-13 DIAGNOSIS — Z6841 Body Mass Index (BMI) 40.0 and over, adult: Secondary | ICD-10-CM

## 2019-02-13 NOTE — Patient Instructions (Signed)
Provider also recommends patient see primary care physician for a routine physical and to establish primary care. Patient may chose provider of choice. Also gave the [place]  PHYSICIAN REFERRAL LINE at [phone] or web site at [email or web address] to help assist with finding a primary care doctor. Patient understands this office is acute care office only and no primary care is done at this office.    Obesity, Adult Obesity is having too much body fat. Being obese means that your weight is more than what is healthy for you. BMI is a number that explains how much body fat you have. If you have a BMI of 30 or more, you are obese. Obesity is often caused by eating or drinking more calories than your body uses. Changing your lifestyle can help you lose weight. Obesity can cause serious health problems, such as:  Stroke.  Coronary artery disease (CAD).  Type 2 diabetes.  Some types of cancer, including cancers of the colon, breast, uterus, and gallbladder.  Osteoarthritis.  High blood pressure (hypertension).  High cholesterol.  Sleep apnea.  Gallbladder stones.  Infertility problems. What are the causes?  Eating meals each day that are high in calories, sugar, and fat.  Being born with genes that may make you more likely to become obese.  Having a medical condition that causes obesity.  Taking certain medicines.  Sitting a lot (having a sedentary lifestyle).  Not getting enough sleep.  Drinking a lot of drinks that have sugar in them. What increases the risk?  Having a family history of obesity.  Being an [place] woman.  Being a Hispanic man.  Living in an area with limited access to: ? [place], recreation centers, or sidewalks. ? Healthy food choices, such as grocery stores and farmers' markets. What are the signs or symptoms? The main sign is having too much body fat. How is this treated?  Treatment for this condition often includes changing your  lifestyle. Treatment may include: ? Changing your diet. This may include making a healthy meal plan. ? Exercise. This may include activity that causes your heart to beat faster (aerobic exercise) and strength training. Work with your doctor to design a program that works for you. ? Medicine to help you lose weight. This may be used if you are not able to lose 1 pound a week after 6 weeks of healthy eating and more exercise. ? Treating conditions that cause the obesity. ? Surgery. Options may include gastric banding and gastric bypass. This may be done if:  Other treatments have not helped to improve your condition.  You have a BMI of 40 or higher.  You have life-threatening health problems related to obesity. Follow these instructions at home: Eating and drinking   Follow advice from your doctor about what to eat and drink. Your doctor may tell you to: ? Limit fast food, sweets, and processed snack foods. ? Choose low-fat options. For example, choose low-fat milk instead of whole milk. ? Eat 5 or more servings of fruits or vegetables each day. ? Eat at home more often. This gives you more control over what you eat. ? Choose healthy foods when you eat out. ? Learn to read food labels. This will help you learn how much food is in 1 serving. ? Keep low-fat snacks available. ? Avoid drinks that have a lot of sugar in them. These include soda, fruit juice, iced tea with sugar, and flavored milk.  Drink enough water to keep  your pee (urine) pale yellow.  Do not go on fad diets. Physical activity  Exercise often, as told by your doctor. Most adults should get up to 150 minutes of moderate-intensity exercise every week.Ask your doctor: ? What types of exercise are safe for you. ? How often you should exercise.  Warm up and stretch before being active.  Do slow stretching after being active (cool down).  Rest between times of being active. Lifestyle  Work with your doctor and a food  expert (dietitian) to set a weight-loss goal that is best for you.  Limit your screen time.  Find ways to reward yourself that do not involve food.  Do not drink alcohol if: ? Your doctor tells you not to drink. ? You are pregnant, may be pregnant, or are planning to become pregnant.  If you drink alcohol: ? Limit how much you use to:  0-1 drink a day for women.  0-2 drinks a day for men. ? Be aware of how much alcohol is in your drink. In the [place] one drink equals one 12 oz bottle of beer (355 mL), one 5 oz glass of wine (148 mL), or one 1 oz glass of hard liquor (44 mL). General instructions  Keep a weight-loss journal. This can help you keep track of: ? The food that you eat. ? How much exercise you get.  Take over-the-counter and prescription medicines only as told by your doctor.  Take vitamins and supplements only as told by your doctor.  Think about joining a support group.  Keep all follow-up visits as told by your doctor. This is important. Contact a doctor if:  You cannot meet your weight loss goal after you have changed your diet and lifestyle for 6 weeks. Get help right away if you:  Are having trouble breathing.  Are having thoughts of harming yourself. Summary  Obesity is having too much body fat.  Being obese means that your weight is more than what is healthy for you.  Work with your doctor to set a weight-loss goal.  Get regular exercise as told by your doctor. This information is not intended to replace advice given to you by your health care provider. Make sure you discuss any questions you have with your health care provider. Document Released: 07/02/2011 Document Revised: 12/12/2017 Document Reviewed: 12/12/2017 Elsevier Patient Education  2020 [employer]. Health Maintenance, Female Adopting a healthy lifestyle and getting preventive care are important in promoting health and wellness. Ask your health care provider about:  The right  schedule for you to have regular tests and exams.  Things you can do on your own to prevent diseases and keep yourself healthy. What should I know about diet, weight, and exercise? Eat a healthy diet   Eat a diet that includes plenty of vegetables, fruits, low-fat dairy products, and lean protein.  Do not eat a lot of foods that are high in solid fats, added sugars, or sodium. Maintain a healthy weight Body mass index (BMI) is used to identify weight problems. It estimates body fat based on height and weight. Your health care provider can help determine your BMI and help you achieve or maintain a healthy weight. Get regular exercise Get regular exercise. This is one of the most important things you can do for your health. Most adults should:  Exercise for at least 150 minutes each week. The exercise should increase your heart rate and make you sweat (moderate-intensity exercise).  Do strengthening exercises at least  twice a week. This is in addition to the moderate-intensity exercise.  Spend less time sitting. Even light physical activity can be beneficial. Watch cholesterol and blood lipids Have your blood tested for lipids and cholesterol at 50 years of age, then have this test every 5 years. Have your cholesterol levels checked more often if:  Your lipid or cholesterol levels are high.  You are older than 50 years of age.  You are at high risk for heart disease. What should I know about cancer screening? Depending on your health history and family history, you may need to have cancer screening at various ages. This may include screening for:  Breast cancer.  Cervical cancer.  Colorectal cancer.  Skin cancer.  Lung cancer. What should I know about heart disease, diabetes, and high blood pressure? Blood pressure and heart disease  High blood pressure causes heart disease and increases the risk of stroke. This is more likely to develop in people who have high blood  pressure readings, are of African descent, or are overweight.  Have your blood pressure checked: ? Every 3-5 years if you are 48-57 years of age. ? Every year if you are 29 years old or older. Diabetes Have regular diabetes screenings. This checks your fasting blood sugar level. Have the screening done:  Once every three years after age 70 if you are at a normal weight and have a low risk for diabetes.  More often and at a younger age if you are overweight or have a high risk for diabetes. What should I know about preventing infection? Hepatitis B If you have a higher risk for hepatitis B, you should be screened for this virus. Talk with your health care provider to find out if you are at risk for hepatitis B infection. Hepatitis C Testing is recommended for:  Everyone born from [birth year] through 1967.  Anyone with known risk factors for hepatitis C. Sexually transmitted infections (STIs)  Get screened for STIs, including gonorrhea and chlamydia, if: ? You are sexually active and are younger than 50 years of age. ? You are older than 50 years of age and your health care provider tells you that you are at risk for this type of infection. ? Your sexual activity has changed since you were last screened, and you are at increased risk for chlamydia or gonorrhea. Ask your health care provider if you are at risk.  Ask your health care provider about whether you are at high risk for HIV. Your health care provider may recommend a prescription medicine to help prevent HIV infection. If you choose to take medicine to prevent HIV, you should first get tested for HIV. You should then be tested every 3 months for as long as you are taking the medicine. Pregnancy  If you are about to stop having your period (premenopausal) and you may become pregnant, seek counseling before you get pregnant.  Take 400 to 800 micrograms (mcg) of folic acid every day if you become pregnant.  Ask for birth control  (contraception) if you want to prevent pregnancy. Osteoporosis and menopause Osteoporosis is a disease in which the bones lose minerals and strength with aging. This can result in bone fractures. If you are 87 years old or older, or if you are at risk for osteoporosis and fractures, ask your health care provider if you should:  Be screened for bone loss.  Take a calcium or vitamin D supplement to lower your risk of fractures.  Be given hormone  replacement therapy (HRT) to treat symptoms of menopause. Follow these instructions at home: Lifestyle  Do not use any products that contain nicotine or tobacco, such as cigarettes, e-cigarettes, and chewing tobacco. If you need help quitting, ask your health care provider.  Do not use street drugs.  Do not share needles.  Ask your health care provider for help if you need support or information about quitting drugs. Alcohol use  Do not drink alcohol if: ? Your health care provider tells you not to drink. ? You are pregnant, may be pregnant, or are planning to become pregnant.  If you drink alcohol: ? Limit how much you use to 0-1 drink a day. ? Limit intake if you are breastfeeding.  Be aware of how much alcohol is in your drink. In the [place].S., one drink equals one 12 oz bottle of beer (355 mL), one 5 oz glass of wine (148 mL), or one 1 oz glass of hard liquor (44 mL). General instructions  Schedule regular health, dental, and eye exams.  Stay current with your vaccines.  Tell your health care provider if: ? You often feel depressed. ? You have ever been abused or do not feel safe at home. Summary  Adopting a healthy lifestyle and getting preventive care are important in promoting health and wellness.  Follow your health care provider's instructions about healthy diet, exercising, and getting tested or screened for diseases.  Follow your health care provider's instructions on monitoring your cholesterol and blood pressure. This  information is not intended to replace advice given to you by your health care provider. Make sure you discuss any questions you have with your health care provider. Document Released: 10/23/2010 Document Revised: 04/02/2018 Document Reviewed: 04/02/2018 Elsevier Patient Education  2020 [employer].

## 2019-02-13 NOTE — Progress Notes (Signed)
[facility]  [name] [name] DOB: 48 y.o. MRN: [id number]  Subjective:  Here for Biometric Screen/brief exam Patient is a 48 year old female in no acute distress who comes to clinic for her biometric screening and brief exam. She sees Dr. Dina Rich as her primary care and does see them regularly.  She has multiple chronic medical conditions as below.  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  She denies any new or changing concerns and is keeping follow-up with her primary care provider daughter and with her specialty and post surgery appointments.  She also has a history of a total knee replacement to her right knee with cement on 11/06/2018 She is currently using a walker, status post hip and knee replacement.  Previous to her hip surgery she was able to walk on her knee again without any assistive device. She denies any falls or injury. She denies any leg pain or leg swelling. Patient  denies any fever, body aches,chills, rash, chest pain, shortness of breath, nausea, vomiting, or diarrhea.    Allergies  Allergen Reactions  . Aspirin Swelling     Current Outpatient Medications:  .  acetaminophen (TYLENOL) 500 MG tablet, Take 1,000 mg by mouth every 6 (six) hours as needed for moderate pain or headache., Disp: , Rfl:  .  carvedilol (COREG) 12.5 MG tablet, Take 1 tablet (12.5 mg total) by mouth 2 (two) times daily., Disp: 180 tablet, Rfl: 3 .  enoxaparin (LOVENOX) 40 MG/0.4ML injection, Inject 0.4 mLs (40 mg total) into the skin daily for 14 days., Disp: 5.6 mL, Rfl: 0 .  gabapentin (NEURONTIN) 300 MG capsule, Take 300 mg by mouth at bedtime., Disp: , Rfl:  .  methocarbamol (ROBAXIN) 500 MG tablet, Take 1 tablet (500 mg total) by mouth every 6 (six) hours as needed for muscle spasms., Disp: 30 tablet, Rfl: 0 .  Multiple Vitamins-Minerals (EQ  MULTIVITAMINS ADULT GUMMY PO), Take 2 each by mouth daily., Disp: , Rfl:  .  oxyCODONE 10 MG TABS, Take 1-1.5 tablets (10-15 mg total) by mouth every 4 (four) hours as needed for severe pain (pain score 7-10)., Disp: 40 tablet, Rfl: 0 .  pantoprazole (PROTONIX) 40 MG tablet, Take 40 mg by mouth daily. , Disp: , Rfl:    Patient Active Problem List   Diagnosis Date Noted  . Status post total hip replacement, left 01/29/2019  . Status post total knee replacement using cement, right 11/06/2018  . BMI 60.0-69.9, adult (HCC) 09/12/2018  . Sprain of ankle 11/20/2017  . Acute left-sided back pain 09/19/2017  . Encounter for biometric screening 09/19/2017  . Gastroesophageal reflux disease without esophagitis 09/19/2017  . Cyst of right ovary 07/03/2017  . DUB (dysfunctional uterine bleeding) 07/03/2017  . Vaginal mass 07/03/2017  . Sleep apnea 06/20/2017  . Primary osteoarthritis of left knee 09/19/2016  . Appendicitis, acute 08/16/2016  . Left bundle-branch block, unspecified 07/13/2015  . Hypertension 07/13/2015  . Pain in joint, lower leg 01/12/2014  . Atrial tachycardia, paroxysmal (HCC)      Social History   Tobacco Use  . Smoking status: Never Smoker  . Smokeless tobacco: Never Used  Substance Use Topics  . Alcohol use: No  . Drug use: No    Objective: Body mass index is 65.9 kg/m.  Blood pressure 140/82, pulse 91, temperature 97.8 F (36.6 C), temperature source Temporal, resp. rate 20, height  5\' 3"  (1.6 m), weight (!) 372 lb (168.7 kg), SpO2 97 %.  NAD, well developed, well nourished. Patient is alert and oriented and responsive to questions Engages in eye contact with provider. Speaks in full sentences without any pauses without any shortness of breath or distress.  HEENT: Within normal limits Neck: Normal, supple, thyroid normal, no cervical lymphadenopathy  Heart: Regular rate and rhythm without any murmurs rubs or gallops Lungs: Clear to auscultation without any  adventitious lung sounds Neuro:  Gait is slow but steady with walker in hall and in room.  She does not, onto the exam table due to her status post hip surgery. She has no bilateral leg edema, her legs are large due to morbid obesity.  2+ pedal pulse.  Skin is normal in appearance. Assessment: Biometric screen Encounter for biometric screening - Plan: Glucose, random, Lipid panel  Encounter for other general examination- not a full annual physical - brief biometric exam and biometric screening.  - Plan: Glucose, random, Lipid panel  Class 3 severe obesity due to excess calories with serious comorbidity and body mass index (BMI) of 60.0 to 69.9 in adult [facility])    Plan:  Keep regular follow up appointments with your hip surgeon and specialties and your primary care as they recommend and at least once yearly with primary care for a full physical and lab work.  I will have the office call you on your glucose and cholesterol results when they return if you have not heard within 1 week please call the office or sent to your Mychart account. Please call the clinic during office hours with any questions or concerns.  This biometric physical is a brief physical and the only labs done are glucose and your lipid panel(cholesterol) and is  not a substitute for seeing a primary care provider for a complete annual physical. Please see a primary care physician for routine health maintenance, labs and full physical at least yearly and follow up as recommended by your provider. Provider also recommends if you do not have a primary care provider for patient to establish care as soon as possible .Patient may chose provider of choice. Also gave the [place] at [phone] or web site at [email or web address] to help assist with finding a primary care doctor.  Patient verbalizes understanding that his office is acute care only and not a substitute for a primary care or for the  management of chronic conditions.    Fasting glucose and lipids. Discussed with patient that today's visit here is a limited biometric screening visit (not a comprehensive exam or management of any chronic problems) Discussed some health issues, including healthy eating habits and exercise. Encouraged to follow-up with PCP for annual comprehensive preventive and wellness care (and if applicable, any chronic issues). Questions invited and answered.

## 2019-02-14 LAB — LIPID PANEL
Chol/HDL Ratio: 3.3 ratio (ref 0.0–4.4)
Cholesterol, Total: 192 mg/dL (ref 100–199)
HDL: 58 mg/dL (ref >39)
LDL Chol Calc (NIH): 102 mg/dL — ABNORMAL HIGH (ref 0–99)
Triglycerides: 185 mg/dL — ABNORMAL HIGH (ref 0–149)
VLDL Cholesterol Cal: 32 mg/dL (ref 5–40)

## 2019-02-14 LAB — GLUCOSE, RANDOM: Glucose: 94 mg/dL (ref 65–99)

## 2019-02-16 ENCOUNTER — Other Ambulatory Visit: Payer: Self-pay | Admitting: *Deleted

## 2019-02-16 MED ORDER — CARVEDILOL 12.5 MG PO TABS: 12.5000 mg | ORAL_TABLET | Freq: Two times a day (BID) | ORAL | 0 refills | Status: DC

## 2019-03-25 ENCOUNTER — Other Ambulatory Visit: Payer: Self-pay | Admitting: Orthopedic Surgery

## 2019-04-06 ENCOUNTER — Inpatient Hospital Stay: Admission: RE | Admit: 2019-04-06 | Payer: Managed Care, Other (non HMO) | Source: Ambulatory Visit

## 2019-04-10 ENCOUNTER — Other Ambulatory Visit: Payer: Managed Care, Other (non HMO)

## 2019-04-14 ENCOUNTER — Inpatient Hospital Stay: Admit: 2019-04-14 | Payer: Managed Care, Other (non HMO) | Admitting: Orthopedic Surgery

## 2019-04-14 SURGERY — ARTHROPLASTY, KNEE, TOTAL
Anesthesia: Choice | Site: Knee | Laterality: Left

## 2019-05-20 ENCOUNTER — Other Ambulatory Visit: Payer: Self-pay | Admitting: Orthopedic Surgery

## 2019-05-21 ENCOUNTER — Encounter
Admission: RE | Admit: 2019-05-21 | Discharge: 2019-05-21 | Disposition: A | Discharge status: Home / Self Care | Payer: Managed Care, Other (non HMO) | Source: Ambulatory Visit | Attending: Orthopedic Surgery | Admitting: Orthopedic Surgery

## 2019-05-21 DIAGNOSIS — Z20822 Contact with and (suspected) exposure to covid-19: Secondary | ICD-10-CM | POA: Insufficient documentation

## 2019-05-21 DIAGNOSIS — I447 Left bundle-branch block, unspecified: Secondary | ICD-10-CM | POA: Insufficient documentation

## 2019-05-21 DIAGNOSIS — Z01818 Encounter for other preprocedural examination: Secondary | ICD-10-CM | POA: Insufficient documentation

## 2019-05-21 DIAGNOSIS — I10 Essential (primary) hypertension: Secondary | ICD-10-CM | POA: Insufficient documentation

## 2019-05-21 DIAGNOSIS — R9431 Abnormal electrocardiogram [ECG] [EKG]: Secondary | ICD-10-CM | POA: Diagnosis not present

## 2019-05-21 NOTE — Patient Instructions (Signed)
Your procedure is scheduled on: 05-26-19 TUESDAY Report to Same Day Surgery 2nd floor medical mall [facility] Entrance-take elevator on left to 2nd floor.  Check in with surgery information desk.) To find out your arrival time please call [phone] between 1PM - 3PM on 05-25-19 MONDAY  Remember: Instructions that are not followed completely may result in serious medical risk, up to and including death, or upon the discretion of your surgeon and anesthesiologist your surgery may need to be rescheduled.    _x___ 1. Do not eat food after midnight the night before your procedure. NO GUM OR CANDY AFTER MIDNIGHT. You may drink clear liquids up to 2 hours before you are scheduled to arrive at the hospital for your procedure.  Do not drink clear liquids within 2 hours of your scheduled arrival to the hospital.  Clear liquids include  --Water or Apple juice without pulp  --Gatorade  --Black Coffee or Clear Tea (No milk, no creamers, do not add anything to the coffee or Tea   ____Ensure clear carbohydrate drink on the way to the hospital for bariatric patients  _X___Ensure clear carbohydrate drink 3 hours PRIOR TO ARRIVAL TIME TO HOSPITAL-IF ARRIVAL TIME IS 6 AM, HAVE ENSURE FINISHED BY 4:30 AM. OTHERWISE, FINISH DRINKING 3 HOURS PRIOR TO ARRIVAL TIME TO HOSPITAL    __x__ 2. No Alcohol for 24 hours before or after surgery.   __x__3. No Smoking or e-cigarettes for 24 prior to surgery.  Do not use any chewable tobacco products for at least 6 hour prior to surgery   ____  4. Bring all medications with you on the day of surgery if instructed.    __x__ 5. Notify your doctor if there is any change in your medical condition     (cold, fever, infections).    x___6. On the morning of surgery brush your teeth with toothpaste and water.  You may rinse your mouth with mouth wash if you wish.  Do not swallow any toothpaste or mouthwash.   Do not wear jewelry, make-up, hairpins, clips or nail polish.  Do  not wear lotions, powders, or perfumes.  Do not shave 48 hours prior to surgery. Men may shave face and neck.  Do not bring valuables to the hospital.    [facility] is not responsible for any belongings or valuables.               Contacts, dentures or bridgework may not be worn into surgery.  Leave your suitcase in the car. After surgery it may be brought to your room.  For patients admitted to the hospital, discharge time is determined by your treatment team.  _  Patients discharged the day of surgery will not be allowed to drive home.  You will need someone to drive you home and stay with you the night of your procedure.    Please read over the following fact sheets that you were given:   [facility] Preparing for Surgery and  MRSA Information/INCENTIVE SPIROMETER   _x___ TAKE THE FOLLOWING MEDICATION THE MORNING OF SURGERY WITH A SMALL SIP OF WATER. These include:  1. COREG (CARVEDILOL)  2. PROTONIX (PANTOPRAZOLE)  3.  4.  5.  6.  ____Fleets enema or Magnesium Citrate as directed.   _x___ Use CHG Soap or sage wipes as directed on instruction sheet   ____ Use inhalers on the day of surgery and bring to hospital day of surgery  ____ Stop Metformin and Janumet 2 days prior to  surgery.    ____ Take 1/2 of usual insulin dose the night before surgery and none on the morning surgery.   ____ Follow recommendations from Cardiologist, Pulmonologist or PCP regarding  stopping Aspirin, Coumadin, Plavix ,Eliquis, Effient, or Pradaxa, and Pletal.  X____Stop Anti-inflammatories such as Advil, Aleve, Ibuprofen, Motrin, Naproxen,MOBIC (MELOXICAM) Naprosyn, Goodies powders or aspirin products NOW-OK to take Tylenol   ____ Stop supplements until after surgery.    _X___ Bring C-Pap to the hospital.

## 2019-05-22 ENCOUNTER — Other Ambulatory Visit: Payer: Self-pay

## 2019-05-22 ENCOUNTER — Encounter
Admission: RE | Admit: 2019-05-22 | Discharge: 2019-05-22 | Disposition: A | Discharge status: Home / Self Care | Payer: Managed Care, Other (non HMO) | Source: Ambulatory Visit | Attending: Orthopedic Surgery | Admitting: Orthopedic Surgery

## 2019-05-22 ENCOUNTER — Other Ambulatory Visit: Admission: RE | Admit: 2019-05-22 | Payer: Managed Care, Other (non HMO) | Source: Ambulatory Visit

## 2019-05-22 DIAGNOSIS — Z01818 Encounter for other preprocedural examination: Secondary | ICD-10-CM | POA: Diagnosis not present

## 2019-05-22 LAB — URINALYSIS, COMPLETE (UACMP) WITH MICROSCOPIC
Bacteria, UA: NONE SEEN
Bilirubin Urine: NEGATIVE
Glucose, UA: NEGATIVE mg/dL
Hgb urine dipstick: NEGATIVE
Ketones, ur: NEGATIVE mg/dL
Leukocytes,Ua: NEGATIVE
Nitrite: NEGATIVE
Protein, ur: NEGATIVE mg/dL
Specific Gravity, Urine: 1.013 (ref 1.005–1.030)
pH: 6 (ref 5.0–8.0)

## 2019-05-22 LAB — PROTIME-INR
INR: 1 (ref 0.8–1.2)
Prothrombin Time: 13.2 seconds (ref 11.4–15.2)

## 2019-05-22 LAB — CBC WITH DIFFERENTIAL/PLATELET
Abs Immature Granulocytes: 0.08 10*3/uL — ABNORMAL HIGH (ref 0.00–0.07)
Basophils Absolute: 0 10*3/uL (ref 0.0–0.1)
Basophils Relative: 1 %
Eosinophils Absolute: 0.1 10*3/uL (ref 0.0–0.5)
Eosinophils Relative: 1 %
HCT: 37.3 % (ref 36.0–46.0)
Hemoglobin: 10.6 g/dL — ABNORMAL LOW (ref 12.0–15.0)
Immature Granulocytes: 1 %
Lymphocytes Relative: 16 %
Lymphs Abs: 1.1 10*3/uL (ref 0.7–4.0)
MCH: 21.7 pg — ABNORMAL LOW (ref 26.0–34.0)
MCHC: 28.4 g/dL — ABNORMAL LOW (ref 30.0–36.0)
MCV: 76.4 fL — ABNORMAL LOW (ref 80.0–100.0)
Monocytes Absolute: 0.6 10*3/uL (ref 0.1–1.0)
Monocytes Relative: 9 %
Neutro Abs: 4.6 10*3/uL (ref 1.7–7.7)
Neutrophils Relative %: 72 %
Platelets: 284 10*3/uL (ref 150–400)
RBC: 4.88 MIL/uL (ref 3.87–5.11)
RDW: 17.6 % — ABNORMAL HIGH (ref 11.5–15.5)
WBC: 6.4 10*3/uL (ref 4.0–10.5)
nRBC: 0 % (ref 0.0–0.2)

## 2019-05-22 LAB — SURGICAL PCR SCREEN
MRSA, PCR: NEGATIVE
Staphylococcus aureus: POSITIVE — AB

## 2019-05-22 LAB — COMPREHENSIVE METABOLIC PANEL
ALT: 22 U/L (ref 0–44)
AST: 19 U/L (ref 15–41)
Albumin: 3.9 g/dL (ref 3.5–5.0)
Alkaline Phosphatase: 94 U/L (ref 38–126)
Anion gap: 11 (ref 5–15)
BUN: 12 mg/dL (ref 6–20)
CO2: 28 mmol/L (ref 22–32)
Calcium: 8.8 mg/dL — ABNORMAL LOW (ref 8.9–10.3)
Chloride: 99 mmol/L (ref 98–111)
Creatinine, Ser: 0.61 mg/dL (ref 0.44–1.00)
GFR calc Af Amer: >60 mL/min (ref >60)
GFR calc non Af Amer: >60 mL/min (ref >60)
Glucose, Bld: 103 mg/dL — ABNORMAL HIGH (ref 70–99)
Potassium: 3.5 mmol/L (ref 3.5–5.1)
Sodium: 138 mmol/L (ref 135–145)
Total Bilirubin: 1.4 mg/dL — ABNORMAL HIGH (ref 0.3–1.2)
Total Protein: 7.9 g/dL (ref 6.5–8.1)

## 2019-05-22 LAB — APTT: aPTT: 37 seconds — ABNORMAL HIGH (ref 24–36)

## 2019-05-22 LAB — SARS CORONAVIRUS 2 (TAT 6-24 HRS): SARS Coronavirus 2: NEGATIVE

## 2019-05-22 NOTE — Pre-Procedure Instructions (Signed)
Pre-Admit Testing Provider Notification Note  Provider Notified: Dr. Rosita Kea  Notification Mode: Fax  Reason: Abnormal PCR results.  Response: Fax confirmation received.  Additional Information: Placed on chart. Noted on Pre-Admit Worksheet.  Signed: Alvester Morin, RN

## 2019-05-23 LAB — URINE CULTURE

## 2019-05-25 MED ORDER — DEXTROSE 5 % IV SOLN
3.0000 g | INTRAVENOUS | Status: AC
  Administered 2019-05-26: 08:00:00 3 g via INTRAVENOUS
  Filled 2019-05-25: qty 3000

## 2019-05-25 MED ORDER — TRANEXAMIC ACID-NACL 1000-0.7 MG/100ML-% IV SOLN
1000.0000 mg | INTRAVENOUS | Status: AC
  Administered 2019-05-26: 08:00:00 1000 mg via INTRAVENOUS

## 2019-05-26 ENCOUNTER — Inpatient Hospital Stay
Admission: RE | Admit: 2019-05-26 | Discharge: 2019-05-31 | DRG: 470 | Disposition: A | Discharge status: Home / Self Care | Payer: Managed Care, Other (non HMO) | Attending: Orthopedic Surgery | Admitting: Orthopedic Surgery

## 2019-05-26 ENCOUNTER — Inpatient Hospital Stay: Payer: Managed Care, Other (non HMO)

## 2019-05-26 ENCOUNTER — Other Ambulatory Visit: Payer: Self-pay

## 2019-05-26 ENCOUNTER — Encounter: Payer: Self-pay | Admitting: Orthopedic Surgery

## 2019-05-26 ENCOUNTER — Encounter
Admission: RE | Disposition: A | Discharge status: Home / Self Care | Payer: Self-pay | Source: Home / Self Care | Attending: Orthopedic Surgery

## 2019-05-26 ENCOUNTER — Inpatient Hospital Stay: Payer: Managed Care, Other (non HMO) | Admitting: Registered Nurse

## 2019-05-26 DIAGNOSIS — Z6841 Body Mass Index (BMI) 40.0 and over, adult: Secondary | ICD-10-CM | POA: Diagnosis not present

## 2019-05-26 DIAGNOSIS — I447 Left bundle-branch block, unspecified: Secondary | ICD-10-CM | POA: Diagnosis present

## 2019-05-26 DIAGNOSIS — G8918 Other acute postprocedural pain: Secondary | ICD-10-CM

## 2019-05-26 DIAGNOSIS — Z886 Allergy status to analgesic agent status: Secondary | ICD-10-CM

## 2019-05-26 DIAGNOSIS — Z96642 Presence of left artificial hip joint: Secondary | ICD-10-CM | POA: Diagnosis present

## 2019-05-26 DIAGNOSIS — I10 Essential (primary) hypertension: Secondary | ICD-10-CM | POA: Diagnosis present

## 2019-05-26 DIAGNOSIS — M1712 Unilateral primary osteoarthritis, left knee: Principal | ICD-10-CM | POA: Diagnosis present

## 2019-05-26 DIAGNOSIS — M21162 Varus deformity, not elsewhere classified, left knee: Secondary | ICD-10-CM | POA: Diagnosis present

## 2019-05-26 DIAGNOSIS — Z79899 Other long term (current) drug therapy: Secondary | ICD-10-CM

## 2019-05-26 DIAGNOSIS — Z8249 Family history of ischemic heart disease and other diseases of the circulatory system: Secondary | ICD-10-CM

## 2019-05-26 DIAGNOSIS — G4730 Sleep apnea, unspecified: Secondary | ICD-10-CM | POA: Diagnosis present

## 2019-05-26 DIAGNOSIS — D62 Acute posthemorrhagic anemia: Secondary | ICD-10-CM | POA: Diagnosis not present

## 2019-05-26 DIAGNOSIS — K219 Gastro-esophageal reflux disease without esophagitis: Secondary | ICD-10-CM | POA: Diagnosis present

## 2019-05-26 DIAGNOSIS — Z96651 Presence of right artificial knee joint: Secondary | ICD-10-CM | POA: Diagnosis present

## 2019-05-26 DIAGNOSIS — Z96652 Presence of left artificial knee joint: Secondary | ICD-10-CM

## 2019-05-26 HISTORY — PX: TOTAL KNEE ARTHROPLASTY: SHX125

## 2019-05-26 LAB — CBC
HCT: 35.2 % — ABNORMAL LOW (ref 36.0–46.0)
Hemoglobin: 10.6 g/dL — ABNORMAL LOW (ref 12.0–15.0)
MCH: 23.8 pg — ABNORMAL LOW (ref 26.0–34.0)
MCHC: 30.1 g/dL (ref 30.0–36.0)
MCV: 79.1 fL — ABNORMAL LOW (ref 80.0–100.0)
Platelets: 286 10*3/uL (ref 150–400)
RBC: 4.45 MIL/uL (ref 3.87–5.11)
RDW: 18.5 % — ABNORMAL HIGH (ref 11.5–15.5)
WBC: 20.5 10*3/uL — ABNORMAL HIGH (ref 4.0–10.5)
nRBC: 0 % (ref 0.0–0.2)

## 2019-05-26 LAB — HEMOGLOBIN: Hemoglobin: 10.2 g/dL — ABNORMAL LOW (ref 12.0–15.0)

## 2019-05-26 LAB — CREATININE, SERUM
Creatinine, Ser: 0.72 mg/dL (ref 0.44–1.00)
GFR calc Af Amer: >60 mL/min (ref >60)
GFR calc non Af Amer: >60 mL/min (ref >60)

## 2019-05-26 LAB — POCT PREGNANCY, URINE: Preg Test, Ur: NEGATIVE

## 2019-05-26 LAB — PREPARE RBC (CROSSMATCH)

## 2019-05-26 SURGERY — ARTHROPLASTY, KNEE, TOTAL
Anesthesia: General | Site: Knee | Laterality: Left

## 2019-05-26 MED ORDER — ASCORBIC ACID 500 MG PO TABS
250.0000 mg | ORAL_TABLET | Freq: Every day | ORAL | Status: DC
  Administered 2019-05-26 – 2019-05-31 (×6): 250 mg via ORAL
  Filled 2019-05-26 (×6): qty 1

## 2019-05-26 MED ORDER — DOCUSATE SODIUM 100 MG PO CAPS
100.0000 mg | ORAL_CAPSULE | Freq: Two times a day (BID) | ORAL | Status: DC
  Administered 2019-05-26 – 2019-05-31 (×10): 100 mg via ORAL
  Filled 2019-05-26 (×10): qty 1

## 2019-05-26 MED ORDER — METHOCARBAMOL 1000 MG/10ML IJ SOLN
500.0000 mg | Freq: Four times a day (QID) | INTRAVENOUS | Status: DC | PRN
  Filled 2019-05-26: qty 5

## 2019-05-26 MED ORDER — DEXMEDETOMIDINE HCL 200 MCG/2ML IV SOLN
INTRAVENOUS | Status: DC | PRN
  Administered 2019-05-26: 16 ug via INTRAVENOUS
  Administered 2019-05-26: 12 ug via INTRAVENOUS
  Administered 2019-05-26: 4 ug via INTRAVENOUS
  Administered 2019-05-26 (×2): 8 ug via INTRAVENOUS

## 2019-05-26 MED ORDER — ACETAMINOPHEN 500 MG PO TABS
1000.0000 mg | ORAL_TABLET | Freq: Four times a day (QID) | ORAL | Status: AC
  Administered 2019-05-26 – 2019-05-27 (×4): 1000 mg via ORAL
  Filled 2019-05-26 (×4): qty 2

## 2019-05-26 MED ORDER — OXYCODONE HCL 5 MG PO TABS
10.0000 mg | ORAL_TABLET | ORAL | Status: DC | PRN
  Administered 2019-05-26 – 2019-05-29 (×4): 10 mg via ORAL
  Filled 2019-05-26 (×4): qty 2

## 2019-05-26 MED ORDER — ONDANSETRON HCL 4 MG/2ML IJ SOLN
INTRAMUSCULAR | Status: AC
  Filled 2019-05-26: qty 2

## 2019-05-26 MED ORDER — CHLORHEXIDINE GLUCONATE 4 % EX LIQD: 60.0000 mL | Freq: Once | CUTANEOUS | Status: DC

## 2019-05-26 MED ORDER — DEXTROSE 5 % IV SOLN
3.0000 g | Freq: Four times a day (QID) | INTRAVENOUS | Status: AC
  Administered 2019-05-26 – 2019-05-27 (×3): 3 g via INTRAVENOUS
  Filled 2019-05-26 (×3): qty 3

## 2019-05-26 MED ORDER — LACTATED RINGERS IV SOLN: INTRAVENOUS | Status: DC | PRN

## 2019-05-26 MED ORDER — FENTANYL CITRATE (PF) 100 MCG/2ML IJ SOLN
INTRAMUSCULAR | Status: AC
  Administered 2019-05-26: 25 ug via INTRAVENOUS
  Filled 2019-05-26: qty 2

## 2019-05-26 MED ORDER — ZOLPIDEM TARTRATE 5 MG PO TABS: 5.0000 mg | ORAL_TABLET | Freq: Every evening | ORAL | Status: DC | PRN

## 2019-05-26 MED ORDER — ALUM & MAG HYDROXIDE-SIMETH 200-200-20 MG/5ML PO SUSP: 30.0000 mL | ORAL | Status: DC | PRN

## 2019-05-26 MED ORDER — MORPHINE SULFATE 10 MG/ML IJ SOLN
INTRAMUSCULAR | Status: DC | PRN
  Administered 2019-05-26: 10 mg via INTRAMUSCULAR

## 2019-05-26 MED ORDER — CARVEDILOL 12.5 MG PO TABS
12.5000 mg | ORAL_TABLET | Freq: Two times a day (BID) | ORAL | Status: DC
  Administered 2019-05-26 – 2019-05-31 (×10): 12.5 mg via ORAL
  Filled 2019-05-26 (×10): qty 1

## 2019-05-26 MED ORDER — BUPIVACAINE LIPOSOME 1.3 % IJ SUSP
INTRAMUSCULAR | Status: AC
  Filled 2019-05-26: qty 20

## 2019-05-26 MED ORDER — LIDOCAINE HCL (CARDIAC) PF 100 MG/5ML IV SOSY
PREFILLED_SYRINGE | INTRAVENOUS | Status: DC | PRN
  Administered 2019-05-26: 100 mg via INTRAVENOUS

## 2019-05-26 MED ORDER — SODIUM CHLORIDE FLUSH 0.9 % IV SOLN
INTRAVENOUS | Status: AC
  Filled 2019-05-26: qty 40

## 2019-05-26 MED ORDER — SODIUM CHLORIDE 0.9 % IV SOLN
INTRAVENOUS | Status: DC | PRN
  Administered 2019-05-26: 60 mL

## 2019-05-26 MED ORDER — FENTANYL CITRATE (PF) 100 MCG/2ML IJ SOLN
INTRAMUSCULAR | Status: AC
  Filled 2019-05-26: qty 2

## 2019-05-26 MED ORDER — PROPOFOL 10 MG/ML IV BOLUS
INTRAVENOUS | Status: DC | PRN
  Administered 2019-05-26: 200 mg via INTRAVENOUS

## 2019-05-26 MED ORDER — NEOMYCIN-POLYMYXIN B GU 40-200000 IR SOLN
Status: AC
  Filled 2019-05-26: qty 20

## 2019-05-26 MED ORDER — BUPIVACAINE-EPINEPHRINE (PF) 0.25% -1:200000 IJ SOLN
INTRAMUSCULAR | Status: DC | PRN
  Administered 2019-05-26: 30 mL

## 2019-05-26 MED ORDER — ROCURONIUM BROMIDE 50 MG/5ML IV SOLN
INTRAVENOUS | Status: AC
  Filled 2019-05-26: qty 1

## 2019-05-26 MED ORDER — ACETAMINOPHEN 10 MG/ML IV SOLN
INTRAVENOUS | Status: DC | PRN
  Administered 2019-05-26: 1000 mg via INTRAVENOUS

## 2019-05-26 MED ORDER — GABAPENTIN 300 MG PO CAPS
300.0000 mg | ORAL_CAPSULE | Freq: Every day | ORAL | Status: DC
  Administered 2019-05-26 – 2019-05-30 (×5): 300 mg via ORAL
  Filled 2019-05-26 (×5): qty 1

## 2019-05-26 MED ORDER — NEOMYCIN-POLYMYXIN B GU 40-200000 IR SOLN
Status: DC | PRN
  Administered 2019-05-26: 6 mL

## 2019-05-26 MED ORDER — MIDAZOLAM HCL 2 MG/2ML IJ SOLN
INTRAMUSCULAR | Status: AC
  Filled 2019-05-26: qty 2

## 2019-05-26 MED ORDER — EPINEPHRINE PF 1 MG/ML IJ SOLN
INTRAMUSCULAR | Status: AC
  Filled 2019-05-26: qty 1

## 2019-05-26 MED ORDER — METOCLOPRAMIDE HCL 10 MG PO TABS: 5.0000 mg | ORAL_TABLET | Freq: Three times a day (TID) | ORAL | Status: DC | PRN

## 2019-05-26 MED ORDER — SUGAMMADEX SODIUM 500 MG/5ML IV SOLN
INTRAVENOUS | Status: AC
  Filled 2019-05-26: qty 5

## 2019-05-26 MED ORDER — PROPOFOL 500 MG/50ML IV EMUL
INTRAVENOUS | Status: AC
  Filled 2019-05-26: qty 50

## 2019-05-26 MED ORDER — SUCCINYLCHOLINE CHLORIDE 20 MG/ML IJ SOLN
INTRAMUSCULAR | Status: DC | PRN
  Administered 2019-05-26: 140 mg via INTRAVENOUS

## 2019-05-26 MED ORDER — SUCCINYLCHOLINE CHLORIDE 20 MG/ML IJ SOLN
INTRAMUSCULAR | Status: AC
  Filled 2019-05-26: qty 1

## 2019-05-26 MED ORDER — FENTANYL CITRATE (PF) 100 MCG/2ML IJ SOLN
INTRAMUSCULAR | Status: DC | PRN
  Administered 2019-05-26: 50 ug via INTRAVENOUS
  Administered 2019-05-26: 25 ug via INTRAVENOUS
  Administered 2019-05-26: 50 ug via INTRAVENOUS
  Administered 2019-05-26: 25 ug via INTRAVENOUS
  Administered 2019-05-26: 50 ug via INTRAVENOUS

## 2019-05-26 MED ORDER — DIPHENHYDRAMINE HCL 12.5 MG/5ML PO ELIX: 12.5000 mg | ORAL_SOLUTION | ORAL | Status: DC | PRN

## 2019-05-26 MED ORDER — SODIUM CHLORIDE 0.9 % IV SOLN: INTRAVENOUS | Status: DC

## 2019-05-26 MED ORDER — PHENYLEPHRINE HCL (PRESSORS) 10 MG/ML IV SOLN
INTRAVENOUS | Status: AC
  Filled 2019-05-26: qty 1

## 2019-05-26 MED ORDER — ACETAMINOPHEN 325 MG PO TABS
325.0000 mg | ORAL_TABLET | Freq: Four times a day (QID) | ORAL | Status: DC | PRN
  Administered 2019-05-27 – 2019-05-31 (×12): 650 mg via ORAL
  Filled 2019-05-26 (×13): qty 2

## 2019-05-26 MED ORDER — TRAMADOL HCL 50 MG PO TABS
50.0000 mg | ORAL_TABLET | Freq: Four times a day (QID) | ORAL | Status: DC
  Administered 2019-05-26 – 2019-05-31 (×18): 50 mg via ORAL
  Filled 2019-05-26 (×18): qty 1

## 2019-05-26 MED ORDER — BUPIVACAINE HCL (PF) 0.25 % IJ SOLN
INTRAMUSCULAR | Status: AC
  Filled 2019-05-26: qty 30

## 2019-05-26 MED ORDER — DEXAMETHASONE SODIUM PHOSPHATE 10 MG/ML IJ SOLN
INTRAMUSCULAR | Status: AC
  Filled 2019-05-26: qty 1

## 2019-05-26 MED ORDER — PHENOL 1.4 % MT LIQD
1.0000 | OROMUCOSAL | Status: DC | PRN
  Filled 2019-05-26: qty 177

## 2019-05-26 MED ORDER — LIDOCAINE HCL (PF) 2 % IJ SOLN
INTRAMUSCULAR | Status: AC
  Filled 2019-05-26: qty 10

## 2019-05-26 MED ORDER — MORPHINE SULFATE (PF) 10 MG/ML IV SOLN
INTRAVENOUS | Status: AC
  Filled 2019-05-26: qty 1

## 2019-05-26 MED ORDER — LORATADINE 10 MG PO TABS: 10.0000 mg | ORAL_TABLET | Freq: Every day | ORAL | Status: DC | PRN

## 2019-05-26 MED ORDER — ALBUMIN HUMAN 5 % IV SOLN: INTRAVENOUS | Status: DC | PRN

## 2019-05-26 MED ORDER — OXYCODONE HCL 5 MG PO TABS
5.0000 mg | ORAL_TABLET | ORAL | Status: DC | PRN
  Administered 2019-05-26: 5 mg via ORAL
  Administered 2019-05-27 – 2019-05-31 (×12): 10 mg via ORAL
  Filled 2019-05-26: qty 2
  Filled 2019-05-26: qty 1
  Filled 2019-05-26 (×2): qty 2
  Filled 2019-05-26: qty 1
  Filled 2019-05-26 (×3): qty 2
  Filled 2019-05-26: qty 1
  Filled 2019-05-26 (×8): qty 2

## 2019-05-26 MED ORDER — BUPIVACAINE HCL (PF) 0.5 % IJ SOLN
INTRAMUSCULAR | Status: AC
  Filled 2019-05-26: qty 10

## 2019-05-26 MED ORDER — HYDROMORPHONE HCL 1 MG/ML IJ SOLN
0.5000 mg | INTRAMUSCULAR | Status: DC | PRN
  Administered 2019-05-28: 1 mg via INTRAVENOUS
  Filled 2019-05-26: qty 1

## 2019-05-26 MED ORDER — FENTANYL CITRATE (PF) 100 MCG/2ML IJ SOLN
25.0000 ug | INTRAMUSCULAR | Status: DC | PRN
  Administered 2019-05-26: 25 ug via INTRAVENOUS

## 2019-05-26 MED ORDER — PANTOPRAZOLE SODIUM 40 MG PO TBEC: 40.0000 mg | DELAYED_RELEASE_TABLET | Freq: Every day | ORAL | Status: DC

## 2019-05-26 MED ORDER — SODIUM CHLORIDE (PF) 0.9 % IJ SOLN
INTRAMUSCULAR | Status: AC
  Filled 2019-05-26: qty 10

## 2019-05-26 MED ORDER — MAGNESIUM HYDROXIDE 400 MG/5ML PO SUSP: 30.0000 mL | Freq: Every day | ORAL | Status: DC | PRN

## 2019-05-26 MED ORDER — SUGAMMADEX SODIUM 500 MG/5ML IV SOLN
INTRAVENOUS | Status: DC | PRN
  Administered 2019-05-26: 500 mg via INTRAVENOUS

## 2019-05-26 MED ORDER — MIDAZOLAM HCL 5 MG/5ML IJ SOLN
INTRAMUSCULAR | Status: DC | PRN
  Administered 2019-05-26: 2 mg via INTRAVENOUS

## 2019-05-26 MED ORDER — ROCURONIUM BROMIDE 100 MG/10ML IV SOLN
INTRAVENOUS | Status: DC | PRN
  Administered 2019-05-26: 45 mg via INTRAVENOUS
  Administered 2019-05-26: 5 mg via INTRAVENOUS
  Administered 2019-05-26 (×2): 30 mg via INTRAVENOUS
  Administered 2019-05-26 (×2): 20 mg via INTRAVENOUS

## 2019-05-26 MED ORDER — ENOXAPARIN SODIUM 40 MG/0.4ML ~~LOC~~ SOLN
40.0000 mg | Freq: Two times a day (BID) | SUBCUTANEOUS | Status: DC
  Administered 2019-05-27 – 2019-05-31 (×9): 40 mg via SUBCUTANEOUS
  Filled 2019-05-26 (×10): qty 0.4

## 2019-05-26 MED ORDER — ONDANSETRON HCL 4 MG PO TABS: 4.0000 mg | ORAL_TABLET | Freq: Four times a day (QID) | ORAL | Status: DC | PRN

## 2019-05-26 MED ORDER — METOCLOPRAMIDE HCL 5 MG/ML IJ SOLN: 5.0000 mg | Freq: Three times a day (TID) | INTRAMUSCULAR | Status: DC | PRN

## 2019-05-26 MED ORDER — DEXMEDETOMIDINE HCL IN NACL 80 MCG/20ML IV SOLN
INTRAVENOUS | Status: AC
  Filled 2019-05-26: qty 20

## 2019-05-26 MED ORDER — ALBUMIN HUMAN 5 % IV SOLN
INTRAVENOUS | Status: AC
  Filled 2019-05-26: qty 250

## 2019-05-26 MED ORDER — MENTHOL 3 MG MT LOZG
1.0000 | LOZENGE | OROMUCOSAL | Status: DC | PRN
  Filled 2019-05-26: qty 9

## 2019-05-26 MED ORDER — KETOROLAC TROMETHAMINE 30 MG/ML IJ SOLN
INTRAMUSCULAR | Status: DC | PRN
  Administered 2019-05-26: 30 mg via INTRA_ARTICULAR

## 2019-05-26 MED ORDER — KETAMINE HCL 10 MG/ML IJ SOLN
INTRAMUSCULAR | Status: DC | PRN
  Administered 2019-05-26: 10 mg via INTRAVENOUS
  Administered 2019-05-26: 20 mg via INTRAVENOUS
  Administered 2019-05-26: 50 mg via INTRAVENOUS

## 2019-05-26 MED ORDER — ACETAMINOPHEN 10 MG/ML IV SOLN
INTRAVENOUS | Status: AC
  Filled 2019-05-26: qty 100

## 2019-05-26 MED ORDER — PROPOFOL 500 MG/50ML IV EMUL
INTRAVENOUS | Status: DC | PRN
  Administered 2019-05-26: 75 ug/kg/min via INTRAVENOUS

## 2019-05-26 MED ORDER — TRANEXAMIC ACID-NACL 1000-0.7 MG/100ML-% IV SOLN
INTRAVENOUS | Status: AC
  Filled 2019-05-26: qty 100

## 2019-05-26 MED ORDER — KETOROLAC TROMETHAMINE 30 MG/ML IJ SOLN
INTRAMUSCULAR | Status: AC
  Filled 2019-05-26: qty 1

## 2019-05-26 MED ORDER — ADULT MULTIVITAMIN W/MINERALS CH
1.0000 | ORAL_TABLET | Freq: Every day | ORAL | Status: DC
  Administered 2019-05-26 – 2019-05-31 (×6): 1 via ORAL
  Filled 2019-05-26 (×6): qty 1

## 2019-05-26 MED ORDER — ONDANSETRON HCL 4 MG/2ML IJ SOLN
4.0000 mg | Freq: Once | INTRAMUSCULAR | Status: AC | PRN
  Administered 2019-05-26: 4 mg via INTRAVENOUS

## 2019-05-26 MED ORDER — VITAMIN D 25 MCG (1000 UNIT) PO TABS
1000.0000 [IU] | ORAL_TABLET | Freq: Every day | ORAL | Status: DC
  Administered 2019-05-26 – 2019-05-31 (×6): 1000 [IU] via ORAL
  Filled 2019-05-26 (×6): qty 1

## 2019-05-26 MED ORDER — KETAMINE HCL 50 MG/ML IJ SOLN
INTRAMUSCULAR | Status: AC
  Filled 2019-05-26: qty 10

## 2019-05-26 MED ORDER — PHENYLEPHRINE HCL (PRESSORS) 10 MG/ML IV SOLN
INTRAVENOUS | Status: DC | PRN
  Administered 2019-05-26 (×2): 100 ug via INTRAVENOUS
  Administered 2019-05-26 (×2): 200 ug via INTRAVENOUS
  Administered 2019-05-26 (×3): 100 ug via INTRAVENOUS

## 2019-05-26 MED ORDER — GLYCOPYRROLATE 0.2 MG/ML IJ SOLN
INTRAMUSCULAR | Status: AC
  Filled 2019-05-26: qty 1

## 2019-05-26 MED ORDER — ONDANSETRON HCL 4 MG/2ML IJ SOLN
INTRAMUSCULAR | Status: DC | PRN
  Administered 2019-05-26: 4 mg via INTRAVENOUS

## 2019-05-26 MED ORDER — PANTOPRAZOLE SODIUM 40 MG PO TBEC
40.0000 mg | DELAYED_RELEASE_TABLET | Freq: Every day | ORAL | Status: DC
  Administered 2019-05-27 – 2019-05-30 (×4): 40 mg via ORAL
  Filled 2019-05-26 (×4): qty 1

## 2019-05-26 MED ORDER — SODIUM CHLORIDE FLUSH 0.9 % IV SOLN
INTRAVENOUS | Status: AC
  Filled 2019-05-26: qty 20

## 2019-05-26 MED ORDER — EPHEDRINE SULFATE 50 MG/ML IJ SOLN
INTRAMUSCULAR | Status: AC
  Filled 2019-05-26: qty 1

## 2019-05-26 MED ORDER — SEVOFLURANE IN SOLN
RESPIRATORY_TRACT | Status: AC
  Filled 2019-05-26: qty 250

## 2019-05-26 MED ORDER — SODIUM CHLORIDE (PF) 0.9 % IJ SOLN
INTRAMUSCULAR | Status: AC
  Filled 2019-05-26: qty 50

## 2019-05-26 MED ORDER — LACTATED RINGERS IV SOLN: INTRAVENOUS | Status: DC

## 2019-05-26 MED ORDER — ONDANSETRON HCL 4 MG/2ML IJ SOLN: 4.0000 mg | Freq: Four times a day (QID) | INTRAMUSCULAR | Status: DC | PRN

## 2019-05-26 MED ORDER — DEXAMETHASONE SODIUM PHOSPHATE 10 MG/ML IJ SOLN
INTRAMUSCULAR | Status: DC | PRN
  Administered 2019-05-26: 10 mg via INTRAVENOUS

## 2019-05-26 MED ORDER — BISACODYL 10 MG RE SUPP: 10.0000 mg | Freq: Every day | RECTAL | Status: DC | PRN

## 2019-05-26 MED ORDER — MAGNESIUM CITRATE PO SOLN
1.0000 | Freq: Once | ORAL | Status: DC | PRN
  Filled 2019-05-26: qty 296

## 2019-05-26 MED ORDER — METHOCARBAMOL 500 MG PO TABS: 500.0000 mg | ORAL_TABLET | Freq: Four times a day (QID) | ORAL | Status: DC | PRN

## 2019-05-26 SURGICAL SUPPLY — 77 items
BLADE SAGITTAL 25.0X1.19X90 (BLADE) ×2 IMPLANT
BLADE SAGITTAL 25.0X1.19X90MM (BLADE) ×1
BLADE SAW 90X13X1.19 OSCILLAT (BLADE) ×3 IMPLANT
BLOCK CUTTING FEMUR 3 LT MED (MISCELLANEOUS) ×3 IMPLANT
BLOCK CUTTING TIBIAL 2 LT (MISCELLANEOUS) ×3 IMPLANT
BNDG COHESIVE 4X5 TAN STRL (GAUZE/BANDAGES/DRESSINGS) ×3 IMPLANT
BNDG ELASTIC 6X5.8 VLCR STR LF (GAUZE/BANDAGES/DRESSINGS) ×3 IMPLANT
CANISTER SUCT 1200ML W/VALVE (MISCELLANEOUS) ×3 IMPLANT
CANISTER SUCT 3000ML PPV (MISCELLANEOUS) ×6 IMPLANT
CANISTER WOUND CARE 500ML ATS (WOUND CARE) ×3 IMPLANT
CEMENT FEMORAL COMP SZ3P LEFT (Femur) ×3 IMPLANT
CEMENT HV SMART SET (Cement) ×18 IMPLANT
CEMENT PATELLA RESURF SZ1 (Cement) ×3 IMPLANT
CHLORAPREP W/TINT 26 (MISCELLANEOUS) ×6 IMPLANT
COOLER POLAR GLACIER W/PUMP (MISCELLANEOUS) ×3 IMPLANT
COVER WAND RF STERILE (DRAPES) ×3 IMPLANT
CUFF TOURN SGL QUICK 24 (TOURNIQUET CUFF)
CUFF TOURN SGL QUICK 30 (TOURNIQUET CUFF)
CUFF TOURN SGL QUICK 42 (TOURNIQUET CUFF) ×3 IMPLANT
CUFF TRNQT CYL 24X4X16.5-23 (TOURNIQUET CUFF) IMPLANT
CUFF TRNQT CYL 30X4X21-28X (TOURNIQUET CUFF) IMPLANT
DRAPE 3/4 80X56 (DRAPES) ×6 IMPLANT
ELECT CAUTERY BLADE 6.4 (BLADE) ×3 IMPLANT
ELECT REM PT RETURN 9FT ADLT (ELECTROSURGICAL) ×3
ELECTRODE REM PT RTRN 9FT ADLT (ELECTROSURGICAL) ×1 IMPLANT
FEMUR BONE MODEL 4.9010 MEDACT (MISCELLANEOUS) ×3 IMPLANT
GAUZE 4X4 16PLY RFD (DISPOSABLE) ×3 IMPLANT
GAUZE SPONGE 4X4 12PLY STRL (GAUZE/BANDAGES/DRESSINGS) ×3 IMPLANT
GAUZE XEROFORM 1X8 LF (GAUZE/BANDAGES/DRESSINGS) ×3 IMPLANT
GLOVE BIOGEL PI IND STRL 9 (GLOVE) ×1 IMPLANT
GLOVE BIOGEL PI INDICATOR 9 (GLOVE) ×2
GLOVE INDICATOR 8.0 STRL GRN (GLOVE) ×3 IMPLANT
GLOVE SURG ORTHO 8.0 STRL STRW (GLOVE) ×3 IMPLANT
GLOVE SURG SYN 9.0  PF PI (GLOVE) ×2
GLOVE SURG SYN 9.0 PF PI (GLOVE) ×1 IMPLANT
GOWN SRG 2XL LVL 4 RGLN SLV (GOWNS) ×1 IMPLANT
GOWN STRL NON-REIN 2XL LVL4 (GOWNS) ×2
GOWN STRL REUS W/ TWL LRG LVL3 (GOWN DISPOSABLE) ×1 IMPLANT
GOWN STRL REUS W/ TWL XL LVL3 (GOWN DISPOSABLE) ×1 IMPLANT
GOWN STRL REUS W/TWL LRG LVL3 (GOWN DISPOSABLE) ×2
GOWN STRL REUS W/TWL XL LVL3 (GOWN DISPOSABLE) ×2
HOLDER FOLEY CATH W/STRAP (MISCELLANEOUS) ×3 IMPLANT
HOOD PEEL AWAY FLYTE STAYCOOL (MISCELLANEOUS) ×6 IMPLANT
INSERT TIBIAL SZ2 LEFT (Insert) ×6 IMPLANT
KIT PREVENA INCISION MGT20CM45 (CANNISTER) ×3 IMPLANT
KIT TURNOVER KIT A (KITS) ×3 IMPLANT
NDL SAFETY ECLIPSE 18X1.5 (NEEDLE) ×1 IMPLANT
NEEDLE HYPO 18GX1.5 SHARP (NEEDLE) ×2
NEEDLE SPNL 18GX3.5 QUINCKE PK (NEEDLE) ×3 IMPLANT
NEEDLE SPNL 20GX3.5 QUINCKE YW (NEEDLE) ×3 IMPLANT
NS IRRIG 1000ML POUR BTL (IV SOLUTION) ×3 IMPLANT
PACK TOTAL KNEE (MISCELLANEOUS) ×3 IMPLANT
PAD WRAPON POLAR KNEE (MISCELLANEOUS) ×1 IMPLANT
PENCIL SMOKE EVACUATOR COATED (MISCELLANEOUS) ×3 IMPLANT
PULSAVAC PLUS IRRIG FAN TIP (DISPOSABLE) ×3
SCALPEL PROTECTED #10 DISP (BLADE) ×6 IMPLANT
SOL .9 NS 3000ML IRR  AL (IV SOLUTION) ×2
SOL .9 NS 3000ML IRR UROMATIC (IV SOLUTION) ×1 IMPLANT
SPONGE LAP 18X18 RF (DISPOSABLE) ×12 IMPLANT
STAPLER SKIN PROX 35W (STAPLE) ×3 IMPLANT
STEM EXTENSION 11MMX30MM (Stem) ×3 IMPLANT
SUCTION FRAZIER HANDLE 10FR (MISCELLANEOUS) ×4
SUCTION TUBE FRAZIER 10FR DISP (MISCELLANEOUS) ×2 IMPLANT
SUT DVC 2 QUILL PDO  T11 36X36 (SUTURE) ×2
SUT DVC 2 QUILL PDO T11 36X36 (SUTURE) ×1 IMPLANT
SUT ETHIBOND 2 V 37 (SUTURE) IMPLANT
SUT V-LOC 90 ABS DVC 3-0 CL (SUTURE) ×6 IMPLANT
SYR 20ML LL LF (SYRINGE) ×3 IMPLANT
SYR 50ML LL SCALE MARK (SYRINGE) ×6 IMPLANT
TIBIAL BONE MODEL LEFT (MISCELLANEOUS) ×3 IMPLANT
TIBIAL TRAY FIXED MEDACTA 0207 (Joint) ×3 IMPLANT
TIP FAN IRRIG PULSAVAC PLUS (DISPOSABLE) ×1 IMPLANT
TOWEL OR 17X26 4PK STRL BLUE (TOWEL DISPOSABLE) ×3 IMPLANT
TOWER CARTRIDGE SMART MIX (DISPOSABLE) ×9 IMPLANT
TRAY FOLEY MTR SLVR 16FR STAT (SET/KITS/TRAYS/PACK) IMPLANT
TUBE KAMVAC SUCTION (TUBING) ×6 IMPLANT
WRAPON POLAR PAD KNEE (MISCELLANEOUS) ×3

## 2019-05-26 NOTE — Progress Notes (Signed)
Pt is resting in bed with Polar Cube on left knee. She apologized for how she spoke to RN earlier and said "I really did not mean to do that. I am sorry." Pt given 10mg  of Roxi for pain as ordered. Call bell is within reach.

## 2019-05-26 NOTE — Anesthesia Postprocedure Evaluation (Signed)
Anesthesia Post Note  Patient: [name]  Procedure(s) Performed: TOTAL KNEE ARTHROPLASTY (Left Knee)  Patient location during evaluation: PACU Anesthesia Type: General Level of consciousness: awake and alert and oriented Pain management: pain level controlled Vital Signs Assessment: post-procedure vital signs reviewed and stable Respiratory status: spontaneous breathing Cardiovascular status: blood pressure returned to baseline Anesthetic complications: no     Last Vitals:  Vitals:   05/26/19 1320 05/26/19 1611  BP: 125/76 (!) 143/92  Pulse: 72 80  Resp: 14 16  Temp: 37 C 36.9 C  SpO2: 100% 100%    Last Pain:  Vitals:   05/26/19 1320  TempSrc: Oral  PainSc:                  [name] [name]

## 2019-05-26 NOTE — H&P (Signed)
Subjective:   Patient is a 49 y.o. female presents with chronic severe left knee osteoarthritis.  She has had previous right total knee and one on her my knee CT was found to have severe left hip osteoarthritis and has undergone replacements of both the right knee and the left hip.  After failed extensive nonoperative treatment for several years she comes in today for elective left knee replacement  Patient Active Problem List   Diagnosis Date Noted  . Status post total hip replacement, left 01/29/2019  . Status post total knee replacement using cement, right 11/06/2018  . BMI 60.0-69.9, adult (HCC) 09/12/2018  . Sprain of ankle 11/20/2017  . Acute left-sided back pain 09/19/2017  . Encounter for biometric screening 09/19/2017  . Gastroesophageal reflux disease without esophagitis 09/19/2017  . Cyst of right ovary 07/03/2017  . DUB (dysfunctional uterine bleeding) 07/03/2017  . Vaginal mass 07/03/2017  . Sleep apnea 06/20/2017  . Primary osteoarthritis of left knee 09/19/2016  . Appendicitis, acute 08/16/2016  . Left bundle-branch block, unspecified 07/13/2015  . Hypertension 07/13/2015  . Pain in joint, lower leg 01/12/2014  . Atrial tachycardia, paroxysmal (HCC)    Past Medical History:  Diagnosis Date  . Appendicitis 08/16/2016  . Arthritis   . Atrial tachycardia, paroxysmal (HCC)    a. s/p ablation in 2009 by Dr. Chales Abrahams in [facility] (falied); b. Managed w/ beta blocker.  Marland Kitchen GERD (gastroesophageal reflux disease)   . Hypertension   . LBBB (left bundle branch block)    atrial tach.  . Obesity   . Sleep apnea    cpap    Past Surgical History:  Procedure Laterality Date  . ABLATION OF DYSRHYTHMIC FOCUS  2009  . ANTERIOR CRUCIATE LIGAMENT REPAIR    . APPLICATION OF WOUND VAC Right 11/06/2018   Procedure: APPLICATION OF WOUND VAC;  Surgeon: Kennedy Bucker, MD;  Location: [facility] ORS;  Service: Orthopedics;  Laterality: Right;  [id number]  . LAPAROSCOPIC APPENDECTOMY N/A 08/16/2016    Procedure: APPENDECTOMY LAPAROSCOPIC;  Surgeon: Lattie Haw, MD;  Location: [facility] ORS;  Service: General;  Laterality: N/A;  . TONSILLECTOMY    . TOTAL HIP ARTHROPLASTY Left 01/29/2019   Procedure: TOTAL HIP ARTHROPLASTY ANTERIOR APPROACH;  Surgeon: Kennedy Bucker, MD;  Location: [facility] ORS;  Service: Orthopedics;  Laterality: Left;  . TOTAL KNEE ARTHROPLASTY Right 11/06/2018   Procedure: RIGHT TOTAL KNEE ARTHROPLASTY;  Surgeon: Kennedy Bucker, MD;  Location: [facility] ORS;  Service: Orthopedics;  Laterality: Right;    Medications Prior to Admission  Medication Sig Dispense Refill Last Dose  . acetaminophen (TYLENOL) 500 MG tablet Take 1,000 mg by mouth every 6 (six) hours as needed for moderate pain or headache.   05/25/2019 at 2200  . carvedilol (COREG) 12.5 MG tablet Take 1 tablet (12.5 mg total) by mouth 2 (two) times daily. 180 tablet 0 05/26/2019 at 0430  . Cholecalciferol (VITAMIN D3 PO) Take 1 tablet by mouth daily.   05/25/2019 at Unknown time  . gabapentin (NEURONTIN) 300 MG capsule Take 300 mg by mouth at bedtime.   05/25/2019 at 2200  . loratadine (CLARITIN) 10 MG tablet Take 10 mg by mouth daily as needed for allergies.   03/25/2019  . meloxicam (MOBIC) 15 MG tablet Take 15 mg by mouth daily.   05/21/2019  . Multiple Vitamins-Minerals (EQ MULTIVITAMINS ADULT GUMMY PO) Take 2 each by mouth daily.   05/25/2019 at Unknown time  . pantoprazole (PROTONIX) 40 MG tablet Take 40 mg by mouth at bedtime.  05/26/2019 at 0430  . vitamin C (ASCORBIC ACID) 250 MG tablet Take 250 mg by mouth daily.   05/25/2019 at Unknown time  . enoxaparin (LOVENOX) 40 MG/0.4ML injection Inject 0.4 mLs (40 mg total) into the skin daily for 14 days. (Patient not taking: Reported on 03/27/2019) 5.6 mL 0   . methocarbamol (ROBAXIN) 500 MG tablet Take 1 tablet (500 mg total) by mouth every 6 (six) hours as needed for muscle spasms. (Patient not taking: Reported on 03/27/2019) 30 tablet 0    Allergies  Allergen Reactions  . Aspirin  Swelling    Social History   Tobacco Use  . Smoking status: Never Smoker  . Smokeless tobacco: Never Used  Substance Use Topics  . Alcohol use: No    Family History  Problem Relation Age of Onset  . Hyperlipidemia Mother   . Hypertension Mother   . Hypertension Father     Review of Systems Pertinent items are noted in HPI.  Objective:   Patient Vitals for the past 8 hrs:  BP Temp Temp src Pulse Resp SpO2 Height Weight  05/26/19 0702 - - - - - - 5\' 3"  (1.6 m) (!) 158.8 kg  05/26/19 0614 (!) 164/97 98.4 F (36.9 C) Tympanic 83 18 99 % - -   No intake/output data recorded. No intake/output data recorded.    BP (!) 164/97   Pulse 83   Temp 98.4 F (36.9 C) (Tympanic)   Resp 18   Ht 5\' 3"  (1.6 m)   Wt (!) 158.8 kg   SpO2 99%   BMI 62.00 kg/m   General Appearance:    Alert, cooperative, no distress, appears stated age  Head:    Normocephalic, without obvious abnormality, atraumatic        Nose:   Nares normal, septum midline, mucosa normal, no drainage    or sinus tenderness  Throat:   Lips, mucosa, and tongue normal; teeth and gums normal  Neck:   Supple, symmetrical, trachea midline, no adenopathy;    thyroid:  no enlargement/tenderness/nodules; no carotid   bruit or JVD     Lungs:     Clear to auscultation bilaterally, respirations unlabored      Heart:    Regular rate and rhythm, S1 and S2 normal, no murmur, rub   or gallop     Abdomen:     Soft, non-tender, bowel sounds active all four quadrants,    no masses, no organomegaly        Extremities:  .0 to 80 degrees range of motion of the left knee with varus deformity0 to 80 degrees range of motion left knee with varus deformity that is passively correctable.  Intact neurovascular distally.  Strong dorsalis pedis pulse.  Crepitation on range of motion of knee.  Pulses:   2+ and symmetric all extremities  Skin:   Skin color, texture, turgor normal, no rashes or lesions     Neurologic:   CNII-XII intact,  normal strength, sensation and reflexes    throughout    ECG: normal sinus rhythm, no blocks or conduction defects, no ischemic changes.  Data ReviewRadiology review: Prior x-rays and CT show severe tricompartmental osteoarthritis with subluxation of the tibia laterally significant bone loss of the patellofemoral joint and medial compartment  Assessment:   Active Problems:   * No active hospital problems. * Severe left knee osteoarthritis  Plan:   Elective left total knee replacement

## 2019-05-26 NOTE — Anesthesia Procedure Notes (Signed)
Procedure Name: Intubation Date/Time: 05/26/2019 8:08 AM Performed by: Lynden Oxford, CRNA Pre-anesthesia Checklist: Patient identified, Emergency Drugs available, Suction available and Patient being monitored Patient Re-evaluated:Patient Re-evaluated prior to induction Oxygen Delivery Method: Circle system utilized Preoxygenation: Pre-oxygenation with 100% oxygen Induction Type: IV induction Ventilation: Mask ventilation with difficulty and Oral airway inserted - appropriate to patient size Laryngoscope Size: McGraph and 3 Grade View: Grade I Tube type: Oral Tube size: 7.5 mm Number of attempts: 1 Airway Equipment and Method: Stylet,  Oral airway,  Patient positioned with wedge pillow and Video-laryngoscopy Placement Confirmation: ETT inserted through vocal cords under direct vision,  positive ETCO2 and breath sounds checked- equal and bilateral Secured at: 20 cm Tube secured with: Tape Dental Injury: Teeth and Oropharynx as per pre-operative assessment  Difficulty Due To: Difficulty was anticipated, Difficult Airway- due to large tongue and Difficult Airway- due to limited oral opening Future Recommendations: Recommend- induction with short-acting agent, and alternative techniques readily available

## 2019-05-26 NOTE — Transfer of Care (Signed)
Immediate Anesthesia Transfer of Care Note  Patient: [name] [name]  Procedure(s) Performed: TOTAL KNEE ARTHROPLASTY (Left Knee)  Patient Location: PACU  Anesthesia Type:General  Level of Consciousness: awake, alert  and oriented  Airway & Oxygen Therapy: Patient Spontanous Breathing and Patient connected to face mask oxygen  Post-op Assessment: Report given to RN and Post -op Vital signs reviewed and stable  Post vital signs: Reviewed and stable  Last Vitals:  Vitals Value Taken Time  BP 110/74 05/26/19 1140  Temp 36.2 C 05/26/19 1140  Pulse 72 05/26/19 1143  Resp 16 05/26/19 1143  SpO2 100 % 05/26/19 1143  Vitals shown include unvalidated device data.  Last Pain:  Vitals:   05/26/19 0614  TempSrc: Tympanic  PainSc: 0-No pain         Complications: No apparent anesthesia complications

## 2019-05-26 NOTE — Anesthesia Preprocedure Evaluation (Addendum)
Anesthesia Evaluation  Patient identified by MRN, date of birth, ID band Patient awake    Reviewed: Allergy & Precautions, NPO status , Patient's Chart, lab work & pertinent test results, reviewed documented beta blocker date and time   History of Anesthesia Complications (+) PONVNegative for: history of anesthetic complications  Airway Mallampati: II  TM Distance: >3 FB     Dental  (+) Dental Advidsory Given   Pulmonary neg shortness of breath, sleep apnea and Continuous Positive Airway Pressure Ventilation , neg recent URI,           Cardiovascular Exercise Tolerance: Good hypertension, Pt. on medications and Pt. on home beta blockers (-) angina(-) Past MI + dysrhythmias (atrial tachycardia, LBBB) Supra Ventricular Tachycardia (-) Valvular Problems/Murmurs     Neuro/Psych negative neurological ROS     GI/Hepatic Neg liver ROS, GERD  Medicated and Controlled,  Endo/Other  Morbid obesity  Renal/GU negative Renal ROS     Musculoskeletal  (+) Arthritis , Osteoarthritis,    Abdominal   Peds  Hematology negative hematology ROS (+)   Anesthesia Other Findings Past Medical History: 08/16/2016: Appendicitis No date: Arthritis No date: Atrial tachycardia, paroxysmal (HCC)     Comment:  a. s/p ablation in 2009 by Dr. Chales Abrahams in [facility]               (falied); b. Managed w/ beta blocker. No date: GERD (gastroesophageal reflux disease) No date: Hypertension No date: LBBB (left bundle branch block) No date: Obesity No date: Sleep apnea     Comment:  cpap   Reproductive/Obstetrics                             Anesthesia Physical  Anesthesia Plan  ASA: III  Anesthesia Plan: Spinal   Post-op Pain Management:    Induction: Intravenous  PONV Risk Score and Plan: 3 and Propofol infusion and TIVA  Airway Management Planned: Natural Airway, Simple Face Mask and Nasal Cannula  Additional  Equipment:   Intra-op Plan:   Post-operative Plan:   Informed Consent: I have reviewed the patients History and Physical, chart, labs and discussed the procedure including the risks, benefits and alternatives for the proposed anesthesia with the patient or authorized representative who has indicated his/her understanding and acceptance.     Dental advisory given  Plan Discussed with: CRNA and Surgeon  Anesthesia Plan Comments: (Will plan on spinal for the procedure, but patient understands that she may require GOT if we are unable to complete the spinal.  Patient understands and agrees.)       Anesthesia Quick Evaluation

## 2019-05-26 NOTE — Op Note (Signed)
05/26/2019  11:32 AM  PATIENT:  [name] [name]  49 y.o. female  PRE-OPERATIVE DIAGNOSIS:  PRIMARY OSTEOARTHRITIS OF LEFT KNEE  POST-OPERATIVE DIAGNOSIS:  PRIMARY OSTEOARTHRITIS LEFT KNEE  PROCEDURE:  Procedure(s): TOTAL KNEE ARTHROPLASTY (Left)  SURGEON: Leitha Schuller, MD  ASSISTANTS: Cranston Neighbor, PA-C  ANESTHESIA:   general  EBL:  Total I/O In: 1990 [I.V.:1100; Blood:640; IV Piggyback:250] Out: 1300 [Blood:1300]  BLOOD ADMINISTERED:1 CC PRBC  DRAINS: none   LOCAL MEDICATIONS USED:  MARCAINE    and OTHER Exparel morphine and Toradol  SPECIMEN:  No Specimen  DISPOSITION OF SPECIMEN:  N/A  COUNTS:  YES  TOURNIQUET:   Total Tourniquet Time Documented: Thigh (Left) - 5 minutes Thigh (Left) - 1 minutes Total: Thigh (Left) - 6 minutes 65 minutes at 450 mmHg  IMPLANTS: Medacta GMK sphere 3+ femur, 2 left tibia, short stem tibia, 10 mm insert and size 1 patella component cemented  DICTATION: .Dragon Dictation  patient was brought to the operating room and after failed attempted spinal anesthesia a general anesthesia was obtained the leftleg was prepped and draped in the usual sterile fashion. After patient identification and timeout procedures were completed, the tourniquet was raised and a midline skin incision was made followed by medial parapatellar arthrotomy.  The tourniquet acted as a venous tourniquet at 300 mmHg and at 350 and was left down until later in the case when it was time to cement.  At that point it was 400 mmHg also did not give adequate compression of arterial bleeding and so 450 was the final pressure for cementation. there is exposed bone throughout the entire medial and lateral femoral and tibial condyles withseverepatellofemoral degenerative changes. The ACL and fat pad were excised and the anterior horns of the meniscus were excised.  In the proximal tibia cutting guide from the [facility] was applied proximal tibia cut carried out.  This was  carried out in less flexion than usual secondary to the patient's large size and very difficult exposure secondary to her lack of motion with only approximately 20 to 60 degrees range of motion with anesthesia present. The distal femoral cut was carried out in a similar fashion and the3+cutting guide applied with anterior posterior and chamfer cuts made. The posterior horns of the menisci were removed at this point.  There were very large posterior osteophytes on the tibia and both medial l and lateral femoral condyles.  They were excised with some difficulty secondary difficulty getting the femur elevated but this was subsequently done successfully. The 2tibia baseplate trial was placed pinned into position and proximal tibial preparation carried out with drilling hand reaming and the keel punch followed by placement of the3+femur and sizing the tibial insert size 10gave the best fit with stability and full extension. The distal femoral drill holes were made with the notch cut for the trochlear groove was then carried out with trials were then removed the patella was cut using the patellar cutting guide and it sized to a size1, afterdrill holes have been made.At this point the above local was infiltrated in the periarticular tissues and periosteum. Tourniquet was raised at this time.  The knee was irrigated with pulsatile lavage and the bony surfaces dried the tibial component was cemented into place first. Excess cement was removed and the polyethylene insert placed with a torque screw placed with a torque screwdriver tightened. The distal femoral component cannot be placed into the joint because it was so tight and the tibia was so far forward it  did not allow access to the femur.  After attempting this again with a second batch of cement the tibial insert was removed and discarded and the femoral component then cemented into place with the polyethylene component inserted and impacted and the  knee held in extension as the patella was addressed. After the cement was set excess cement was removed and the knee was again irrigated thoroughly thoroughly irrigated. The tourniquet was let down and hemostasis checked electrocautery.   The arthrotomy was repaired with a heavy Quill suture, #2 Ethibondfollowed by 3-0 V lock subcuticular closure,skin staples,incisional wound VAC secondary to the size of the leg present  and Ace wrapand Polar Care Because of the inability to get the tourniquet to work during the procedure she did have much more blood loss than usual and received 1 unit of packed cells intraoperatively.  Hemoglobin to be checked postop.  PLAN OF CARE: Admit to inpatient   PATIENT DISPOSITION:  PACU - hemodynamically stable.

## 2019-05-26 NOTE — OR Nursing (Signed)
PT home c-pap machine checked by [employer] and taken to PACU at this time

## 2019-05-26 NOTE — Anesthesia Procedure Notes (Addendum)
Spinal  Patient location during procedure: OR Start time: 05/26/2019 7:40 AM End time: 05/26/2019 7:55 AM Staffing Performed: anesthesiologist and resident/CRNA  Anesthesiologist: Yves Dill, MD Resident/CRNA: Lynden Oxford, CRNA Preanesthetic Checklist Completed: patient identified, IV checked, site marked, risks and benefits discussed, surgical consent, monitors and equipment checked, pre-op evaluation and timeout performed Spinal Block Patient position: sitting Prep: Betadine Patient monitoring: heart rate, continuous pulse ox, blood pressure and cardiac monitor Approach: midline Location: L4-5 Injection technique: single-shot Needle Needle type: Whitacre and Introducer  Needle gauge: 24 G Needle length: 9 cm Additional Notes Attempted spinal under sterile prep and drape .Marland Kitchen  Unable to get CSF return within a reasonable time frame.Lelon Perla to undergo GOT as was explained as a possibility earlier.  No blood, fluid or paresthesias.  The patient tolerated the procedure well.

## 2019-05-27 LAB — BASIC METABOLIC PANEL
Anion gap: 9 (ref 5–15)
BUN: 15 mg/dL (ref 6–20)
CO2: 26 mmol/L (ref 22–32)
Calcium: 7.9 mg/dL — ABNORMAL LOW (ref 8.9–10.3)
Chloride: 100 mmol/L (ref 98–111)
Creatinine, Ser: 0.75 mg/dL (ref 0.44–1.00)
GFR calc Af Amer: >60 mL/min (ref >60)
GFR calc non Af Amer: >60 mL/min (ref >60)
Glucose, Bld: 144 mg/dL — ABNORMAL HIGH (ref 70–99)
Potassium: 4 mmol/L (ref 3.5–5.1)
Sodium: 135 mmol/L (ref 135–145)

## 2019-05-27 LAB — CBC
HCT: 27.9 % — ABNORMAL LOW (ref 36.0–46.0)
Hemoglobin: 8.6 g/dL — ABNORMAL LOW (ref 12.0–15.0)
MCH: 24.6 pg — ABNORMAL LOW (ref 26.0–34.0)
MCHC: 30.8 g/dL (ref 30.0–36.0)
MCV: 79.7 fL — ABNORMAL LOW (ref 80.0–100.0)
Platelets: 255 10*3/uL (ref 150–400)
RBC: 3.5 MIL/uL — ABNORMAL LOW (ref 3.87–5.11)
RDW: 18.6 % — ABNORMAL HIGH (ref 11.5–15.5)
WBC: 12.1 10*3/uL — ABNORMAL HIGH (ref 4.0–10.5)
nRBC: 0 % (ref 0.0–0.2)

## 2019-05-27 MED ORDER — FE FUMARATE-B12-VIT C-FA-IFC PO CAPS
1.0000 | ORAL_CAPSULE | Freq: Two times a day (BID) | ORAL | Status: DC
  Administered 2019-05-27 – 2019-05-31 (×9): 1 via ORAL
  Filled 2019-05-27 (×10): qty 1

## 2019-05-27 NOTE — Evaluation (Signed)
Occupational Therapy Evaluation Patient Details Name: [name] [name] MRN: [id number] DOB: 1971/03/25 Today's Date: 05/27/2019    History of Present Illness [name] [name] is a 28yoF who comes to [facility] for elective Left TKA. Pt was recently here for Rt TKA 11/06/18, Lt THA (direct anterior) 01/29/19.   Clinical Impression   [name] [name] was seen for OT evaluation this date, POD#1 from above surgery. Pt was modified independent in all ADLs prior to surgery. She endorses using adaptive equipment for BADL management including using a LH reacher and sock aid for LB dressing. Pt is eager to return to PLOF with less pain and improved safety and independence. Pt currently requires minimal to supervision assist for LB dressing while in seated position due to pain and limited AROM of L knee. Pt instructed in polar care mgt, falls prevention strategies, home/routines modifications, DME/AE for LB bathing and dressing tasks, and compression stocking mgt. Pt with good recall and carryover of prior education from past hospitalizations. She return verbalizes understanding of all education provided. No additional skilled OT needs identified will DC in house. Do not currently anticipate any OT needs following this hospitalization.       Follow Up Recommendations  Follow surgeon's recommendation for DC plan and follow-up therapies;No OT follow up    Equipment Recommendations  None recommended by OT(Pt has necessary equipment)    Recommendations for Other Services       Precautions / Restrictions Precautions Precautions: Knee;Fall Precaution Booklet Issued: Yes (comment) Restrictions Weight Bearing Restrictions: Yes LLE Weight Bearing: Weight bearing as tolerated      Mobility Bed Mobility Overal bed mobility: Needs Assistance Bed Mobility: Supine to Sit     Supine to sit: Min guard;HOB elevated     General bed mobility comments: deferred. Pt up in recliner at start/end of session.  Transfers Overall  transfer level: Needs assistance Equipment used: None(bari/pedi RW) Transfers: Sit to/from Stand Sit to Stand: Max assist         General transfer comment: Pt unable to rise from EOB with maxA; subseuqetly cued for increased hip hinge/trunk flexion from elevated bed and pt rises with supervision level assistance    Balance Overall balance assessment: Modified Independent;Mild deficits observed, not formally tested                                         ADL either performed or assessed with clinical judgement   ADL Overall ADL's : Needs assistance/impaired                                     Functional mobility during ADLs: Min guard;Supervision/safety General ADL Comments: Pt requires min guard to min A for LB ADL mgt. Max A to don compression stockings. Pt is very familiar with AE for LB ADL mgt from 2 prior surgeries this year. Endorsing using all equipment at baseline. Has Feels confident in ADL mgt, and will have spouse and son to assist 24/7 upon DC.     Vision Baseline Vision/History: Wears glasses(or contacts) Wears Glasses: At all times Patient Visual Report: No change from baseline       Perception     Praxis      Pertinent Vitals/Pain Pain Assessment: 0-10 Pain Score: 7  Pain Location: operative knee Pain Descriptors / Indicators: Aching;Sore Pain Intervention(s): Limited  activity within patient's tolerance;Monitored during session;Ice applied     Hand Dominance Right   Extremity/Trunk Assessment Upper Extremity Assessment Upper Extremity Assessment: Overall WFL for tasks assessed   Lower Extremity Assessment Lower Extremity Assessment: LLE deficits/detail;Defer to PT evaluation LLE Deficits / Details: s/p L TKA LLE: Unable to fully assess due to pain LLE Coordination: decreased gross motor       Communication Communication Communication: No difficulties   Cognition Arousal/Alertness: Awake/alert Behavior During  Therapy: WFL for tasks assessed/performed Overall Cognitive Status: Within Functional Limits for tasks assessed                                     General Comments       Exercises Total Joint Exercises Ankle Circles/Pumps: AROM;20 reps;Supine Short Arc Quad: AAROM;Left;10 reps;Supine Heel Slides: AAROM;Left;15 reps;Supine Hip ABduction/ADduction: AAROM;Left;15 reps;Supine Goniometric ROM: Difficult to assess d/t habitus; appears to have full extension upon arrival, flexion likely no more than 60 degrees during session (less during heel slide) Other Exercises Other Exercises: OT reviewed AE for LB ADL mgt, pt with good recall/carryover from past admissions. Return verbalizes understanding of all education provided. Handout provided to support continued recall/carryover to the home environment.   Shoulder Instructions      Home Living Family/patient expects to be discharged to:: Private residence Living Arrangements: Spouse/significant other Available Help at Discharge: Family;Available 24 hours/day Type of Home: House Home Access: Stairs to enter [employer] of Steps: 4 Entrance Stairs-Rails: Right;Left Home Layout: One level     Bathroom Shower/Tub: Walk-in Pensions consultant: Standard     Home Equipment: Environmental consultant - 2 wheels;Cane - single point;Crutches;Bedside commode;Adaptive equipment Adaptive Equipment: Sock aid;Long-handled shoe horn;Reacher Additional Comments: has been sleeping in bed or recliner at home recently      Prior Functioning/Environment Level of Independence: Independent with assistive device(s)        Comments: Pt endorses returning to work for approx 3 weeks PTA. Independent with assistive devices for ADL management uses LHR, LHSH, and sock aid for LB dressing. Reports she had gotten back to cooking and was walking without an AD.        OT Problem List: Decreased strength;Pain;Decreased  coordination;Obesity;Decreased activity tolerance      OT Treatment/Interventions:      OT Goals(Current goals can be found in the care plan section) Acute Rehab OT Goals Patient Stated Goal: Return to home and improve independence there OT Goal Formulation: All assessment and education complete, DC therapy Time For Goal Achievement: 05/27/19 Potential to Achieve Goals: Good  OT Frequency:     Barriers to D/C:            Co-evaluation              AM-PAC OT "6 Clicks" Daily Activity     Outcome Measure Help from another person eating meals?: None Help from another person taking care of personal grooming?: None Help from another person toileting, which includes using toliet, bedpan, or urinal?: A Little Help from another person bathing (including washing, rinsing, drying)?: None Help from another person to put on and taking off regular upper body clothing?: None Help from another person to put on and taking off regular lower body clothing?: A Little 6 Click Score: 22   End of Session    Activity Tolerance: Patient tolerated treatment well Patient left: in chair;with call bell/phone within reach;with nursing/sitter in  room;with chair alarm set;with SCD's reapplied(With polar care in place.)  OT Visit Diagnosis: Other abnormalities of gait and mobility (R26.89);Pain Pain - Right/Left: Left Pain - part of body: Knee;Leg                Time: 0919-8022 OT Time Calculation (min): 14 min Charges:  OT General Charges $OT Visit: 1 Visit OT Evaluation $OT Eval Low Complexity: 1 Low  Rockney Ghee, M.S., OTR/L Ascom: [phone] 05/27/19, 12:10 PM

## 2019-05-27 NOTE — Plan of Care (Signed)
  Problem: Education: Goal: Knowledge of the prescribed therapeutic regimen will improve Outcome: Progressing   Problem: Activity: Goal: Ability to avoid complications of mobility impairment will improve Outcome: Progressing   Problem: Clinical Measurements: Goal: Postoperative complications will be avoided or minimized Outcome: Progressing   Plan of care discussed with patient. Patient denies pain at this time and would like to receive next pain dose prior to PT.

## 2019-05-27 NOTE — Evaluation (Signed)
Physical Therapy Evaluation Patient Details Name: [name] [name] MRN: [id number] DOB: 12-31-1970 Today's Date: 05/27/2019   History of Present Illness  [name] [name] is a 89yoF who comes to [facility] for elective Left TKA. Pt was recently here for Rt TKA 11/06/18, Lt THA (direct anterior) 01/29/19.  Clinical Impression  Pt admitted with above diagnosis. Pt currently with functional limitations due to the deficits listed below (see "PT Problem List"). Upon entry, pt in bed, awake and agreeable to participate. The pt is alert and oriented x4, pleasant, conversational, and generally a good historian. Pain well controlld at rest (3/10), increases to 8/10 with exercises/bed mobility. Max effort required to come to EOB with HOB elevated, maxA to rise to standing. Pt tolerates AMB up to 24ft fairly well, steady and safe, but slow. Functional mobility assessment demonstrates increased effort/time requirements, poor tolerance, and need for physical assistance, whereas the patient performed these at a higher level of independence PTA. Pt will benefit from skilled PT intervention to increase independence and safety with basic mobility in preparation for discharge to the venue listed below.       Follow Up Recommendations Home health PT;Follow surgeon's recommendation for DC plan and follow-up therapies;Supervision for mobility/OOB    Equipment Recommendations  None recommended by PT    Recommendations for Other Services       Precautions / Restrictions Precautions Precautions: Knee;Fall Precaution Booklet Issued: Yes (comment) Restrictions Weight Bearing Restrictions: Yes      Mobility  Bed Mobility Overal bed mobility: Needs Assistance Bed Mobility: Supine to Sit     Supine to sit: Min guard;HOB elevated     General bed mobility comments: maximal effort required  Transfers Overall transfer level: Needs assistance Equipment used: None(bari/pedi RW) Transfers: Sit to/from Stand Sit to Stand:  Max assist         General transfer comment: Pt unable to rise from EOB with maxA; subseuqetly cued for increased hip hinge/trunk flexion from elevated bed and pt rises with supervision level assistance  Ambulation/Gait Ambulation/Gait assistance: Min guard Gait Distance (Feet): 50 Feet Assistive device: None(bari pedi RW) Gait Pattern/deviations: Step-to pattern     General Gait Details: 3-point step to gait c chair follow  Stairs            Wheelchair Mobility    Modified Rankin (Stroke Patients Only)       Balance Overall balance assessment: Modified Independent;Mild deficits observed, not formally tested                                           Pertinent Vitals/Pain Pain Assessment: 0-10 Pain Score: 3 (8/10 c some exercises) Pain Location: operative knee Pain Descriptors / Indicators: Aching;Sore Pain Intervention(s): Limited activity within patient's tolerance;Monitored during session;Premedicated before session;Repositioned;Patient requesting pain meds-RN notified;Ice applied    Home Living Family/patient expects to be discharged to:: Private residence Living Arrangements: Spouse/significant other Available Help at Discharge: Family;Available 24 hours/day(son coming home from college to assist patient) Type of Home: House Home Access: Stairs to enter Entrance Stairs-Rails: Psychiatric nurse of Steps: 4 Home Layout: One level Home Equipment: Walker - 2 wheels;Cane - single point;Crutches;Bedside commode;Adaptive equipment Additional Comments: has been sleeping in bed or recliner at home recently    Prior Function                 Hand Dominance   Dominant Hand: Right  Extremity/Trunk Assessment                Communication   Communication: No difficulties  Cognition Arousal/Alertness: Awake/alert Behavior During Therapy: WFL for tasks assessed/performed Overall Cognitive Status: Within Functional  Limits for tasks assessed                                        General Comments      Exercises Total Joint Exercises Ankle Circles/Pumps: AROM;20 reps;Supine Short Arc Quad: AAROM;Left;10 reps;Supine Heel Slides: AAROM;Left;15 reps;Supine Hip ABduction/ADduction: AAROM;Left;15 reps;Supine Goniometric ROM: Difficult to assess d/t habitus; appears to have full extension upon arrival, flexion likely no more than 60 degrees during session (less during heel slide)   Assessment/Plan    PT Assessment Patient needs continued PT services  PT Problem List Decreased strength;Decreased range of motion;Decreased activity tolerance;Decreased balance;Decreased mobility;Decreased knowledge of use of DME       PT Treatment Interventions DME instruction;Gait training;Stair training;Functional mobility training;Therapeutic activities;Therapeutic exercise;Patient/family education    PT Goals (Current goals can be found in the Care Plan section)  Acute Rehab PT Goals Patient Stated Goal: Return to home and improve independence there PT Goal Formulation: With patient Time For Goal Achievement: 06/10/19 Potential to Achieve Goals: Good    Frequency BID   Barriers to discharge        Co-evaluation               AM-PAC PT "6 Clicks" Mobility  Outcome Measure Help needed turning from your back to your side while in a flat bed without using bedrails?: A Little Help needed moving from lying on your back to sitting on the side of a flat bed without using bedrails?: A Little Help needed moving to and from a bed to a chair (including a wheelchair)?: A Lot Help needed standing up from a chair using your arms (e.g., wheelchair or bedside chair)?: A Lot Help needed to walk in hospital room?: A Little Help needed climbing 3-5 steps with a railing? : A Lot 6 Click Score: 15    End of Session   Activity Tolerance: Patient tolerated treatment well;Patient limited by  fatigue Patient left: in chair;with call bell/phone within reach;with chair alarm set;with SCD's reapplied Nurse Communication: Mobility status PT Visit Diagnosis: Other abnormalities of gait and mobility (R26.89);Difficulty in walking, not elsewhere classified (R26.2)    Time: 0981-1914 PT Time Calculation (min) (ACUTE ONLY): 54 min   Charges:   PT Evaluation $PT Eval Moderate Complexity: 1 Mod PT Treatments $Gait Training: 8-22 mins $Therapeutic Exercise: 8-22 mins        10:31 AM, 05/27/19 Rosamaria Lints, PT, DPT Physical Therapist - [facility]  [phone] (ASCOM)    Troi Bechtold C 05/27/2019, 10:30 AM

## 2019-05-27 NOTE — TOC Progression Note (Signed)
Transition of Care [facility]) - Progression Note    Patient Details  Name: [name] [name] MRN: [id number] Date of Birth: May 30, 1970  Transition of Care [facility]) CM/SW Contact  Eilan Mcinerny, Lemar Livings, LCSW Phone Number: 05/27/2019, 10:48 AM  Clinical Narrative:   Just heard from [facility] they can not take pt's [employer] either. There is no home health agency to take pt's case for HHPT. Informed pt of this. She reports she knows her exercises to do and her son will be there to assist and push her like he did last time. She wants to go to [facility] in [place] which is closer to her home. She will ask MD for a prescription for this and follow up with this on her own. She came back to [place] last time but it is 40 minutes form her home. She will talk with surgeon when he comes back this afternoon to see her. No other needs.     Expected Discharge Plan: Home w Home Health Services Barriers to Discharge: Continued Medical Work up, Other (comment)(Insurance coverage for home health)  Expected Discharge Plan and Services Expected Discharge Plan: Home w Home Health Services In-house Referral: Clinical Social Work     Living arrangements for the past 2 months: Single Family Home                                       Social Determinants of Health (SDOH) Interventions    Readmission Risk Interventions Readmission Risk Prevention Plan 11/08/2018  Post Dischage Appt Complete  Medication Screening Complete  Transportation Screening Complete  Some recent data might be hidden

## 2019-05-27 NOTE — Progress Notes (Signed)
Physical Therapy Treatment Patient Details Name: [name] [name] MRN: [id number] DOB: December 23, 1970 Today's Date: 05/27/2019    History of Present Illness [name] [name] is a 41yoF who comes to [facility] for elective Left TKA. Pt was recently here for Rt TKA 11/06/18, Lt THA (direct anterior) 01/29/19.    PT Comments    Pt in recliner upon return to room, reports adequate pain control. Pt agreeable to 2nd PT session this date.  Reviewed HEP in recliner with BLE, particularly due to chronic post op stiffness of RLE. Pt able to perform STS transfer from recliner mostly using chair arms per home technique. AMB progressed to 167ft per pt preference, no balance issues, pace slow but steady, consistent 3-point gait c RW. MinA for LLE back into the bed. Pt cardiovascularly challenged with extended AMB period, HR in 130s , SpO2 WNL, pt appears comfortable c slight fatigue. Pt progressing well in general. Will include stairs training next date.      Follow Up Recommendations  Home health PT;Follow surgeon's recommendation for DC plan and follow-up therapies;Supervision for mobility/OOB     Equipment Recommendations  None recommended by PT    Recommendations for Other Services       Precautions / Restrictions Precautions Precautions: Knee;Fall Precaution Booklet Issued: Yes (comment) Restrictions Weight Bearing Restrictions: Yes LLE Weight Bearing: Weight bearing as tolerated    Mobility  Bed Mobility Overal bed mobility: Needs Assistance Bed Mobility: Sit to Supine       Sit to supine: Min assist   General bed mobility comments: Rt EOB to supine, author assists with LLE back into bed.  Transfers Overall transfer level: Needs assistance Equipment used: (pedibari RW/arm rails) Transfers: Sit to/from Stand Sit to Stand: Min guard            Ambulation/Gait Ambulation/Gait assistance: Supervision Gait Distance (Feet): 160 Feet Assistive device: (bari/youth RW) Gait Pattern/deviations:  Step-to pattern Gait velocity: 0.43m/s   General Gait Details: 3-point step to gait c chair follow   Stairs             Wheelchair Mobility    Modified Rankin (Stroke Patients Only)       Balance Overall balance assessment: No apparent balance deficits (not formally assessed);Modified Independent                                          Cognition Arousal/Alertness: Awake/alert Behavior During Therapy: WFL for tasks assessed/performed Overall Cognitive Status: Within Functional Limits for tasks assessed                                        Exercises Total Joint Exercises Ankle Circles/Pumps: AROM;20 reps;Supine;Both Gluteal Sets: Strengthening;Both;15 reps;Supine Heel Slides: AAROM;15 reps;Supine;Both Hip ABduction/ADduction: AAROM;15 reps;Supine;Both Long Arc Quad: AROM;15 reps;Seated;Both;Limitations Long [employer] Limitations: amplitude of LLE is more limited in height for antalgia Other Exercises Other Exercises: RLE resisted single leg leg press 1x10    General Comments        Pertinent Vitals/Pain Pain Assessment: 0-10 Pain Score: 4  Pain Location: operative knee Pain Descriptors / Indicators: Aching;Sore Pain Intervention(s): Limited activity within patient's tolerance;Monitored during session;Premedicated before session;Repositioned    Home Living Family/patient expects to be discharged to:: Private residence Living Arrangements: Spouse/significant other Available Help at Discharge: Family;Available 24 hours/day Type of  Home: House Home Access: Stairs to enter Entrance Stairs-Rails: Right;Left Home Layout: One level Home Equipment: Environmental consultant - 2 wheels;Cane - single point;Crutches;Bedside commode;Adaptive equipment      Prior Function Level of Independence: Independent with assistive device(s)      Comments: Pt endorses returning to work for approx 3 weeks PTA. Independent with assistive devices for ADL  management uses LHR, LHSH, and sock aid for LB dressing. Reports she had gotten back to cooking and was walking without an AD.   PT Goals (current goals can now be found in the care plan section) Acute Rehab PT Goals Patient Stated Goal: Return to home and improve independence there PT Goal Formulation: With patient Time For Goal Achievement: 06/10/19 Potential to Achieve Goals: Good Progress towards PT goals: Progressing toward goals    Frequency    BID      PT Plan Current plan remains appropriate    Co-evaluation              AM-PAC PT "6 Clicks" Mobility   Outcome Measure  Help needed turning from your back to your side while in a flat bed without using bedrails?: A Little Help needed moving from lying on your back to sitting on the side of a flat bed without using bedrails?: A Little Help needed moving to and from a bed to a chair (including a wheelchair)?: A Lot Help needed standing up from a chair using your arms (e.g., wheelchair or bedside chair)?: A Lot Help needed to walk in hospital room?: A Little Help needed climbing 3-5 steps with a railing? : A Lot 6 Click Score: 15    End of Session   Activity Tolerance: Patient tolerated treatment well;No increased pain Patient left: with call bell/phone within reach;with SCD's reapplied;in bed;with bed alarm set Nurse Communication: Mobility status PT Visit Diagnosis: Other abnormalities of gait and mobility (R26.89);Difficulty in walking, not elsewhere classified (R26.2)     Time: 4270-6237 PT Time Calculation (min) (ACUTE ONLY): 49 min  Charges:  $Gait Training: 8-22 mins $Therapeutic Exercise: 23-37 mins                     2:44 PM, 05/27/19 Rosamaria Lints, PT, DPT Physical Therapist - [facility]  [phone] (ASCOM)    [name] [name] C 05/27/2019, 2:41 PM

## 2019-05-27 NOTE — Progress Notes (Signed)
   Subjective: 1 Day Post-Op Procedure(s) (LRB): TOTAL KNEE ARTHROPLASTY (Left) Patient reports pain as mild.   Patient is well, and has had no acute complaints or problems Denies any CP, SOB, ABD pain. We will start physical therapy today.  Plan is to go Home after hospital stay.  Objective: Vital signs in last 24 hours: Temp:  [97.2 F (36.2 C)-98.6 F (37 C)] 98.3 F (36.8 C) (02/03 0749) Pulse Rate:  [66-90] 75 (02/03 0749) Resp:  [12-19] 12 (02/03 0251) BP: (102-162)/(54-92) 138/80 (02/03 0749) SpO2:  [91 %-100 %] 96 % (02/03 0749)  Intake/Output from previous day: 02/02 0701 - 02/03 0700 In: 3962.8 [P.O.:240; I.V.:2725.9; Blood:640; IV Piggyback:356.9] Out: 1850 [Urine:550; Blood:1300] Intake/Output this shift: No intake/output data recorded.  Recent Labs    05/26/19 1328 05/26/19 1707 05/27/19 0627  HGB 10.2* 10.6* 8.6*   Recent Labs    05/26/19 1707 05/27/19 0627  WBC 20.5* 12.1*  RBC 4.45 3.50*  HCT 35.2* 27.9*  PLT 286 255   Recent Labs    05/26/19 1707 05/27/19 0627  NA  --  135  K  --  4.0  CL  --  100  CO2  --  26  BUN  --  15  CREATININE 0.72 0.75  GLUCOSE  --  144*  CALCIUM  --  7.9*   No results for input(s): LABPT, INR in the last 72 hours.  EXAM General - Patient is Alert, Appropriate and Oriented Extremity - Sensation intact distally Intact pulses distally Dorsiflexion/Plantar flexion intact No cellulitis present Compartment soft Dressing - dressing C/D/I and no drainage, prevena intact with out drainage Motor Function - intact, moving foot and toes well on exam.   Past Medical History:  Diagnosis Date  . Appendicitis 08/16/2016  . Arthritis   . Atrial tachycardia, paroxysmal (HCC)    a. s/p ablation in 2009 by Dr. Chales Abrahams in [facility] (falied); b. Managed w/ beta blocker.  Marland Kitchen GERD (gastroesophageal reflux disease)   . Hypertension   . LBBB (left bundle branch block)    atrial tach.  . Obesity   . Sleep apnea    cpap     Assessment/Plan:   1 Day Post-Op Procedure(s) (LRB): TOTAL KNEE ARTHROPLASTY (Left) Active Problems:   Knee joint replacement status, left   Acute post op blood loss anemia   Estimated body mass index is 62 kg/m as calculated from the following:   Height as of this encounter: 5\' 3"  (1.6 m).   Weight as of this encounter: 158.8 kg. Advance diet Up with therapy  Needs BM VSS Pain controlled Acute post op blood loss anemia - Iron supplement. Recheck labs in am CM to assist with discharge  DVT Prophylaxis - Lovenox and Foot Pumps Weight-Bearing as tolerated to left leg   T. , PA-C [facility] 05/27/2019, 8:17 AM

## 2019-05-27 NOTE — TOC Progression Note (Addendum)
Transition of Care [facility]) - Progression Note    Patient Details  Name: Myrissa Chipley MRN: [id number] Date of Birth: Feb 13, 1971  Transition of Care [facility]) CM/SW Contact  Cachet Mccutchen, Gardiner Rhyme, LCSW Phone Number: 05/27/2019, 9:35 AM  Clinical Narrative:   Met with pt to discuss home health needs. Informed her [facility] is no longer in network with [place]. Christella Scheuermann is an insurance that it is difficult to find an home health agency to see their pts.  Tried- all declined : [facility] [place] [facility] [place] [facility]  [facility] is the only agency that will let this worker know if can take.     Pt made aware and reports she knows all of her exercises to do from her previous knee replacement and hip replacement. Once home health is completed she would like to go to an OP closer to her home in [place]. She will talk with MD regarding this. Work on finding a home health agency to take pt.     Expected Discharge Plan: [place] Barriers to Discharge: Continued Medical Work up, Other (comment)(Insurance coverage for home health)  Expected Discharge Plan and Services Expected Discharge Plan: [place] In-house Referral: Clinical Social Work     Living arrangements for the past 2 months: Single Family Home                                       Social Determinants of Health (SDOH) Interventions    Readmission Risk Interventions Readmission Risk Prevention Plan 11/08/2018  Post Dischage Appt Complete  Medication Screening Complete  Transportation Screening Complete  Some recent data might be hidden

## 2019-05-27 NOTE — TOC Transition Note (Signed)
Transition of Care [facility]) - CM/SW Discharge Note   Patient Details  Name: [name] [name] MRN: [id number] Date of Birth: 1970-07-22  Transition of Care [facility]) CM/SW Contact:  Lucy Chris, LCSW Phone Number: 05/27/2019, 10:51 AM   Clinical Narrative:   Pt aware no home health agency will take her [place] insurance. She will have son assist and knows her exercises and has all DME. She will ask surgeon when he comes back this afternoon about OP script for [facility] in [place] close to her home. No other needs identified.     Final next level of care: Home/Self Care Barriers to Discharge: Continued Medical Work up, Other (comment)(Insurance coverage for home health)   Patient Goals and CMS Choice Patient states their goals for this hospitalization and ongoing recovery are:: Go home and reahb to get back to work      Discharge Placement                       Discharge Plan and Services In-house Referral: Clinical Social Work                                   Social Determinants of Health (SDOH) Interventions     Readmission Risk Interventions Readmission Risk Prevention Plan 11/08/2018  Post Dischage Appt Complete  Medication Screening Complete  Transportation Screening Complete  Some recent data might be hidden

## 2019-05-28 LAB — CBC
HCT: 25.9 % — ABNORMAL LOW (ref 36.0–46.0)
HCT: 26.3 % — ABNORMAL LOW (ref 36.0–46.0)
Hemoglobin: 7.7 g/dL — ABNORMAL LOW (ref 12.0–15.0)
Hemoglobin: 7.9 g/dL — ABNORMAL LOW (ref 12.0–15.0)
MCH: 24.1 pg — ABNORMAL LOW (ref 26.0–34.0)
MCH: 24.3 pg — ABNORMAL LOW (ref 26.0–34.0)
MCHC: 29.7 g/dL — ABNORMAL LOW (ref 30.0–36.0)
MCHC: 30 g/dL (ref 30.0–36.0)
MCV: 80.9 fL (ref 80.0–100.0)
MCV: 80.9 fL (ref 80.0–100.0)
Platelets: 228 10*3/uL (ref 150–400)
Platelets: 221 10*3/uL (ref 150–400)
RBC: 3.2 MIL/uL — ABNORMAL LOW (ref 3.87–5.11)
RBC: 3.25 MIL/uL — ABNORMAL LOW (ref 3.87–5.11)
RDW: 19.1 % — ABNORMAL HIGH (ref 11.5–15.5)
RDW: 19.5 % — ABNORMAL HIGH (ref 11.5–15.5)
WBC: 9.8 10*3/uL (ref 4.0–10.5)
WBC: 10.6 10*3/uL — ABNORMAL HIGH (ref 4.0–10.5)
nRBC: 0 % (ref 0.0–0.2)
nRBC: 0.2 % (ref 0.0–0.2)

## 2019-05-28 MED ORDER — OXYCODONE HCL 5 MG PO TABS: 5.0000 mg | ORAL_TABLET | ORAL | 0 refills | Status: DC | PRN

## 2019-05-28 MED ORDER — FE FUMARATE-B12-VIT C-FA-IFC PO CAPS: 1.0000 | ORAL_CAPSULE | Freq: Two times a day (BID) | ORAL | 0 refills | Status: DC

## 2019-05-28 MED ORDER — TRAMADOL HCL 50 MG PO TABS: 50.0000 mg | ORAL_TABLET | Freq: Four times a day (QID) | ORAL | 0 refills | Status: DC | PRN

## 2019-05-28 MED ORDER — DOCUSATE SODIUM 100 MG PO CAPS: 100.0000 mg | ORAL_CAPSULE | Freq: Two times a day (BID) | ORAL | 0 refills | Status: DC

## 2019-05-28 MED ORDER — ENOXAPARIN SODIUM 40 MG/0.4ML ~~LOC~~ SOLN: 40.0000 mg | SUBCUTANEOUS | 0 refills | Status: DC

## 2019-05-28 MED ORDER — MAGNESIUM HYDROXIDE 400 MG/5ML PO SUSP
30.0000 mL | Freq: Once | ORAL | Status: AC
  Administered 2019-05-28: 30 mL via ORAL
  Filled 2019-05-28: qty 30

## 2019-05-28 NOTE — Discharge Summary (Addendum)
Physician Discharge Summary  Patient ID: [name] [name] MRN: [id number] DOB/AGE: Aug 30, 1970 49 y.o.  Admit date: 05/26/2019 Discharge date: 05/30/2019 Admission Diagnoses:  Knee joint replacement status, left [Z96.652]   Discharge Diagnoses: Patient Active Problem List   Diagnosis Date Noted  . Knee joint replacement status, left 05/26/2019  . Status post total hip replacement, left 01/29/2019  . Status post total knee replacement using cement, right 11/06/2018  . BMI 60.0-69.9, adult ([place]) 09/12/2018  . Sprain of ankle 11/20/2017  . Acute left-sided back pain 09/19/2017  . Encounter for biometric screening 09/19/2017  . Gastroesophageal reflux disease without esophagitis 09/19/2017  . Cyst of right ovary 07/03/2017  . DUB (dysfunctional uterine bleeding) 07/03/2017  . Vaginal mass 07/03/2017  . Sleep apnea 06/20/2017  . Primary osteoarthritis of left knee 09/19/2016  . Appendicitis, acute 08/16/2016  . Left bundle-branch block, unspecified 07/13/2015  . Hypertension 07/13/2015  . Pain in joint, lower leg 01/12/2014  . Atrial tachycardia, paroxysmal (HCC)     Past Medical History:  Diagnosis Date  . Appendicitis 08/16/2016  . Arthritis   . Atrial tachycardia, paroxysmal (HCC)    a. s/p ablation in 2009 by Dr. Elonda Husky in [facility] (falied); b. Managed w/ beta blocker.  Marland Kitchen GERD (gastroesophageal reflux disease)   . Hypertension   . LBBB (left bundle branch block)    atrial tach.  . Obesity   . Sleep apnea    cpap     Transfusion: none   Consultants (if any):   Discharged Condition: Improved  Hospital Course: [name] [name] is an 49 y.o. female who was admitted 05/26/2019 with a diagnosis of <principal problem not specified> and went to the operating room on 05/26/2019 and underwent the above named procedures.    Surgeries: Procedure(s): TOTAL KNEE ARTHROPLASTY on 05/26/2019 Patient tolerated the surgery well. Taken to PACU where she was stabilized and then transferred to the  orthopedic floor.  Started on Lovenox 40 mg q 12 hrs. Foot pumps applied bilaterally at 80 mm. Heels elevated on bed with rolled towels. No evidence of DVT. Negative Homan. Physical therapy started on day #1 for gait training and transfer. OT started day #1 for ADL and assisted devices.  Patient's foley was d/c on day #1. Patient's IV  was d/c on day #2.  On postop day 1 2 and 3, patient's hemoglobin continued to trend down despite iron supplements.  On postop day 2, afternoon recheck of hemoglobin showed hemoglobin was trending up to 7.9.  On postop day 1 and 2 patient making excellent progress of PT.  Vital signs are stable.  On post op day #3 hemoglobin was down to 7.4.  We decided to transfuse 1 unit of packed red blood cells. Post transfusion Hgb 8.2. Good progress with PT, patient stable.  On postop day 4 hemoglobin stable 8.0.  She continue to make good progress of physical therapy.  Vital signs stable.  Pain well controlled.    On postop day 5 hemoglobin uptrending to 8.3.  She made good progress of physical therapy.  Pain well controlled vital signs stable.  Patient ready for discharge to home with home health PT    Implants:  Medacta GMK sphere 3+ femur, 2 left tibia, short stem tibia, 10 mm insert and size 1 patella component cemented  She was given perioperative antibiotics:  Anti-infectives (From admission, onward)   Start     Dose/Rate Route Frequency Ordered Stop   05/26/19 1700  ceFAZolin (ANCEF) 3 g in dextrose  5 % 50 mL IVPB     3 g 100 mL/hr over 30 Minutes Intravenous Every 6 hours 05/26/19 1632 05/27/19 0507   05/26/19 0600  ceFAZolin (ANCEF) 3 g in dextrose 5 % 50 mL IVPB     3 g 100 mL/hr over 30 Minutes Intravenous On call to O.R. 05/25/19 2135 05/26/19 7341    .  She was given sequential compression devices, early ambulation, and Lovenox  for DVT prophylaxis.  She benefited maximally from the hospital stay and there were no complications.    Recent vital  signs:  Vitals:   05/28/19 0451 05/28/19 0736  BP:  (!) 141/74  Pulse: 80 83  Resp:  18  Temp:  98.7 F (37.1 C)  SpO2: 97% 98%    Recent laboratory studies:  Lab Results  Component Value Date   HGB 7.7 (L) 05/28/2019   HGB 8.6 (L) 05/27/2019   HGB 10.6 (L) 05/26/2019   Lab Results  Component Value Date   WBC 9.8 05/28/2019   PLT 228 05/28/2019   Lab Results  Component Value Date   INR 1.0 05/22/2019   Lab Results  Component Value Date   NA 135 05/27/2019   K 4.0 05/27/2019   CL 100 05/27/2019   CO2 26 05/27/2019   BUN 15 05/27/2019   CREATININE 0.75 05/27/2019   GLUCOSE 144 (H) 05/27/2019    Discharge Medications:   Allergies as of 05/28/2019      Reactions   Aspirin Swelling      Medication List    STOP taking these medications   meloxicam 15 MG tablet Commonly known as: MOBIC     TAKE these medications   acetaminophen 500 MG tablet Commonly known as: TYLENOL Take 1,000 mg by mouth every 6 (six) hours as needed for moderate pain or headache.   carvedilol 12.5 MG tablet Commonly known as: COREG Take 1 tablet (12.5 mg total) by mouth 2 (two) times daily.   docusate sodium 100 MG capsule Commonly known as: COLACE Take 1 capsule (100 mg total) by mouth 2 (two) times daily.   enoxaparin 40 MG/0.4ML injection Commonly known as: LOVENOX Inject 0.4 mLs (40 mg total) into the skin daily for 14 days.   EQ MULTIVITAMINS ADULT GUMMY PO Take 2 each by mouth daily.   ferrous fumarate-b12-vitamic C-folic acid capsule Commonly known as: TRINSICON / FOLTRIN Take 1 capsule by mouth 2 (two) times daily after a meal.   gabapentin 300 MG capsule Commonly known as: NEURONTIN Take 300 mg by mouth at bedtime.   loratadine 10 MG tablet Commonly known as: CLARITIN Take 10 mg by mouth daily as needed for allergies.   methocarbamol 500 MG tablet Commonly known as: ROBAXIN Take 1 tablet (500 mg total) by mouth every 6 (six) hours as needed for muscle spasms.    oxyCODONE 5 MG immediate release tablet Commonly known as: Oxy IR/ROXICODONE Take 1-2 tablets (5-10 mg total) by mouth every 4 (four) hours as needed for moderate pain (pain score 4-6).   pantoprazole 40 MG tablet Commonly known as: PROTONIX Take 40 mg by mouth at bedtime.   traMADol 50 MG tablet Commonly known as: ULTRAM Take 1 tablet (50 mg total) by mouth every 6 (six) hours as needed.   vitamin C 250 MG tablet Commonly known as: ASCORBIC ACID Take 250 mg by mouth daily.   VITAMIN D3 PO Take 1 tablet by mouth daily.       Diagnostic Studies: DG Knee 1-2  Views Left  Result Date: 05/26/2019 CLINICAL DATA:  Primary osteoarthritis of the left knee. EXAM: LEFT KNEE - 1-2 VIEW COMPARISON:  CT scan dated 12/31/2018 FINDINGS: Components of the total knee prosthesis appear in excellent position in the AP and lateral projections. No fractures. IMPRESSION: Satisfactory appearance of the left knee after total knee prosthesis insertion. Electronically Signed   By: Francene Boyers M.D.   On: 05/26/2019 12:24    Disposition:     Follow-up Information    Evon Slack, PA-C Follow up in 2 week(s).   Specialties: Orthopedic Surgery, Emergency Medicine Contact information: [address] [place] [address] [phone]            Signed: Patience Musca 05/28/2019, 8:52 AM

## 2019-05-28 NOTE — Progress Notes (Addendum)
Physical Therapy Treatment Patient Details Name: [name] [name] MRN: [id number] DOB: December 01, 1970 Today's Date: 05/28/2019    History of Present Illness [name] [name] is a 48yoF who comes to [facility] for elective Left TKA. Pt was recently here for Rt TKA 11/06/18, Lt THA (direct anterior) 01/29/19.    PT Comments    Pt was supine in bed upon arriving. 3rd attempt to see this afternoon 2/2 to waiting on pain meds prior to PT session. Pt reports having increased pain this afternoon versus morning session with 8/10 rated pain. She was cooperative and agreeable to PT but requested performing exercises and not ambulating. Pt was able to sit EOB and perform knee flexion/extension exercises. ROM 3-64 degrees. She was able to stand from FOB and take sidesteps to [facility] with CGA for safety. Once repositioned in supine, pt performed there ex handout (exercises listed below) and tolerate well. Reports 6/10 at conclusion of PT session. Polar care reapplied, with wound vac running and SCDs placed. Pt is progressing with PT. Will continue to follow per POC.    Follow Up Recommendations  Home health PT;Follow surgeon's recommendation for DC plan and follow-up therapies;Supervision for mobility/OOB     Equipment Recommendations  None recommended by PT    Recommendations for Other Services       Precautions / Restrictions Precautions Precautions: Knee;Fall Precaution Booklet Issued: Yes (comment)(in room already) Restrictions Weight Bearing Restrictions: No    Mobility  Bed Mobility Overal bed mobility: Needs Assistance Bed Mobility: Supine to Sit;Sit to Supine     Supine to sit: HOB elevated;Min guard Sit to supine: Min assist(HOB flat)   General bed mobility comments: Pt was able to transition from supine to EOB short sit to R side of bed with CGA + increased time. she did require min assist with returning to supine in bed post session with therapist assisting LLE.   Transfers Overall transfer level:  Needs assistance Equipment used: Rolling walker (2 wheeled)(Bariatric) Transfers: Sit to/from Stand Sit to Stand: Min guard         General transfer comment: Pt stood and took 3 steps from FOB to [facility] from slightly elevated bed height. CGA for safety with vcs for porper handplacement and improved technique. She tolerated well  Ambulation/Gait             General Gait Details: Pt declined ambulation this afternoon 2/2 ambulated this morning and has more pain this afternoon   Stairs             Wheelchair Mobility    Modified Rankin (Stroke Patients Only)       Balance                                            Cognition Arousal/Alertness: Awake/alert Behavior During Therapy: WFL for tasks assessed/performed Overall Cognitive Status: Within Functional Limits for tasks assessed                                 General Comments: Pt A and O x 4      Exercises Total Joint Exercises Ankle Circles/Pumps: AROM;20 reps;Supine;Both Quad Sets: AROM;Left;10 reps;Supine Gluteal Sets: Strengthening;Both;Supine;10 reps Short Arc Quad: AAROM;Supine;Both;10 reps Heel Slides: AAROM;Supine;Right;Limitations;10 reps;AROM Heel Slides Limitations: Heel slides performed while pt was seated EOB with pillowcase under LLE to decrease friction on  floor. pt performed AROM 20 reps prior to therapist AAROm to promote increased knee flex. she tolerated well Hip ABduction/ADduction: AAROM;Supine;Left;10 reps Straight Leg Raises: AAROM;10 reps;Supine Long Arc Quad: Seated;10 reps;Left;AAROM Goniometric ROM: 3-64 degrees Other Exercises Other Exercises: RLE resisted single leg leg press 1x10    General Comments        Pertinent Vitals/Pain Pain Assessment: 0-10 Pain Score: 8  Pain Location: operative knee(LLE) Pain Descriptors / Indicators: Constant;Aching;Sore Pain Intervention(s): Premedicated before session;Limited activity within patient's  tolerance;Monitored during session;Ice applied(polar care)    Home Living                      Prior Function            PT Goals (current goals can now be found in the care plan section) Acute Rehab PT Goals Patient Stated Goal: Return to home and improve independence there PT Goal Formulation: With patient Time For Goal Achievement: 06/10/19 Potential to Achieve Goals: Good Progress towards PT goals: Progressing toward goals    Frequency    BID      PT Plan Current plan remains appropriate    Co-evaluation              AM-PAC PT "6 Clicks" Mobility   Outcome Measure  Help needed turning from your back to your side while in a flat bed without using bedrails?: A Little Help needed moving from lying on your back to sitting on the side of a flat bed without using bedrails?: A Little Help needed moving to and from a bed to a chair (including a wheelchair)?: A Little Help needed standing up from a chair using your arms (e.g., wheelchair or bedside chair)?: A Little Help needed to walk in hospital room?: A Little Help needed climbing 3-5 steps with a railing? : A Little 6 Click Score: 18    End of Session Equipment Utilized During Treatment: Gait belt Activity Tolerance: Patient tolerated treatment well;Patient limited by pain Patient left: in bed;with call bell/phone within reach;with bed alarm set;with SCD's reapplied Nurse Communication: Mobility status PT Visit Diagnosis: Other abnormalities of gait and mobility (R26.89);Difficulty in walking, not elsewhere classified (R26.2)     Time: 4132-4401 PT Time Calculation (min) (ACUTE ONLY): 32 min                       [name] PTA 05/28/19, 4:33 PM

## 2019-05-28 NOTE — Progress Notes (Addendum)
Physical Therapy Treatment Patient Details Name: [name] [name] MRN: [id number] DOB: Aug 27, 1970 Today's Date: 05/28/2019    History of Present Illness [name] [name] is a 48yoF who comes to [facility] for elective Left TKA. Pt was recently here for Rt TKA 11/06/18, Lt THA (direct anterior) 01/29/19.    PT Comments    Pt in bed upon entry, pain meds received earlier. Noted Hb: drop to 7s overnight, PA reports plans to check CBC again later this date, likely will not transfuse >7.0. Reviewed HEP in bed and seated. Pt struggles with heel slides left in supine, but able to perform well while sitting at EOB. Improve independence with transfers, but rising to standing still requires near maximal effort albeit pt does better with chair arm push-up. AMB more limited this date in distance, 178ft yesterday, today only ~68ft pt reports feeling weaker. Pt able to perform stairs straining with 2 rails then 1 rail to simulate home entry. Communicated to RN pt need for bari [facility] for BM later in date, current toilet too low and no other BSC in room. Pt continues to progress.    Follow Up Recommendations  Home health PT;Follow surgeon's recommendation for DC plan and follow-up therapies;Supervision for mobility/OOB     Equipment Recommendations  None recommended by PT    Recommendations for Other Services       Precautions / Restrictions Precautions Precautions: Knee;Fall Precaution Booklet Issued: Yes (comment) Restrictions LLE Weight Bearing: Weight bearing as tolerated    Mobility  Bed Mobility Overal bed mobility: Needs Assistance Bed Mobility: Sit to Supine     Supine to sit: HOB elevated;Min guard        Transfers Overall transfer level: Needs assistance Equipment used: Rolling walker (2 wheeled) Transfers: Sit to/from Stand Sit to Stand: Min guard         General transfer comment: 3x in session once from elevated EOB, twice from chair with arms  Ambulation/Gait Ambulation/Gait  assistance: Supervision Gait Distance (Feet): 60 Feet Assistive device: Rolling walker (2 wheeled) Gait Pattern/deviations: Step-to pattern     General Gait Details: movign slower this date, more pausing, pt reports feeling weaker, more stiff in knees   Stairs Stairs: Yes Stairs assistance: Min guard Stair Management: One rail Right;Two rails;Forwards;Backwards Number of Stairs: 4 General stair comments: repeated 1st stp 4 times, twice with 2 rails, twice with one rail left   Wheelchair Mobility    Modified Rankin (Stroke Patients Only)       Balance                                            Cognition Arousal/Alertness: Awake/alert Behavior During Therapy: WFL for tasks assessed/performed Overall Cognitive Status: Within Functional Limits for tasks assessed                                        Exercises Total Joint Exercises Short Arc QuadBarbaraann [name];Supine;Both;15 reps Heel Slides: AAROM;15 reps;Supine;Right;Limitations Heel Slides Limitations: Left heel slides performed in sitting with Lef tfoot on floor Hip ABduction/ADduction: AAROM;15 reps;Supine;Both Long Arc Quad: AROM;15 reps;Seated;Right Goniometric ROM: estimated -5 to 65 degrees Other Exercises Other Exercises: RLE resisted single leg leg press 1x10    General Comments        Pertinent Vitals/Pain Pain Score: 6  Pain Location: operative knee Pain Descriptors / Indicators: Aching;Sore Pain Intervention(s): Limited activity within patient's tolerance;Monitored during session;Premedicated before session;Repositioned    Home Living                      Prior Function            PT Goals (current goals can now be found in the care plan section) Acute Rehab PT Goals Patient Stated Goal: Return to home and improve independence there PT Goal Formulation: With patient Time For Goal Achievement: 06/10/19 Potential to Achieve Goals: Good Progress towards PT  goals: Progressing toward goals    Frequency    BID      PT Plan Current plan remains appropriate    Co-evaluation              AM-PAC PT "6 Clicks" Mobility   Outcome Measure  Help needed turning from your back to your side while in a flat bed without using bedrails?: A Little Help needed moving from lying on your back to sitting on the side of a flat bed without using bedrails?: A Little Help needed moving to and from a bed to a chair (including a wheelchair)?: A Little Help needed standing up from a chair using your arms (e.g., wheelchair or bedside chair)?: A Little Help needed to walk in hospital room?: A Little Help needed climbing 3-5 steps with a railing? : A Little 6 Click Score: 18    End of Session Equipment Utilized During Treatment: Gait belt Activity Tolerance: Patient tolerated treatment well;No increased pain;Patient limited by fatigue Patient left: with call bell/phone within reach;with SCD's reapplied;in chair Nurse Communication: Mobility status(Left antebrachial IV access has come out.) PT Visit Diagnosis: Other abnormalities of gait and mobility (R26.89);Difficulty in walking, not elsewhere classified (R26.2)     Time: 1962-2297 PT Time Calculation (min) (ACUTE ONLY): 42 min  Charges:  $Gait Training: 23-37 mins $Therapeutic Exercise: 8-22 mins                     12:41 PM, 05/28/19 Etta Grandchild, PT, DPT Physical Therapist - [facility]  [phone] ([place]) '  [name] [name] C 05/28/2019, 12:36 PM

## 2019-05-28 NOTE — Progress Notes (Signed)
   Subjective: 2 Days Post-Op Procedure(s) (LRB): TOTAL KNEE ARTHROPLASTY (Left) Patient reports pain as mild.   Patient is well, and has had no acute complaints or problems Denies any CP, SOB, ABD pain.  No dizziness or lightheadedness.  Vital signs are stable.  Excellent progress with physical therapy yesterday. We will continue with physical therapy today.  Plan is to go Home after hospital stay.  Objective: Vital signs in last 24 hours: Temp:  [98.7 F (37.1 C)] 98.7 F (37.1 C) (02/04 0736) Pulse Rate:  [77-134] 83 (02/04 0736) Resp:  [18-20] 18 (02/04 0736) BP: (122-156)/(66-74) 141/74 (02/04 0736) SpO2:  [93 %-100 %] 98 % (02/04 0736)  Intake/Output from previous day: 02/03 0701 - 02/04 0700 In: 1257.3 [P.O.:960; I.V.:297.3] Out: 1100 [Urine:1100] Intake/Output this shift: No intake/output data recorded.  Recent Labs    05/26/19 1328 05/26/19 1707 05/27/19 0627 05/28/19 0333  HGB 10.2* 10.6* 8.6* 7.7*   Recent Labs    05/27/19 0627 05/28/19 0333  WBC 12.1* 9.8  RBC 3.50* 3.20*  HCT 27.9* 25.9*  PLT 255 228   Recent Labs    05/26/19 1707 05/27/19 0627  NA  --  135  K  --  4.0  CL  --  100  CO2  --  26  BUN  --  15  CREATININE 0.72 0.75  GLUCOSE  --  144*  CALCIUM  --  7.9*   No results for input(s): LABPT, INR in the last 72 hours.  EXAM General - Patient is Alert, Appropriate and Oriented Extremity - Sensation intact distally Intact pulses distally Dorsiflexion/Plantar flexion intact No cellulitis present Compartment soft  Negative Homans' sign Dressing - dressing C/D/I and no drainage, prevena intact with out drainage Motor Function - intact, moving foot and toes well on exam.   Past Medical History:  Diagnosis Date  . Appendicitis 08/16/2016  . Arthritis   . Atrial tachycardia, paroxysmal (HCC)    a. s/p ablation in 2009 by Dr. Chales Abrahams in [facility] (falied); b. Managed w/ beta blocker.  Marland Kitchen GERD (gastroesophageal reflux disease)   .  Hypertension   . LBBB (left bundle branch block)    atrial tach.  . Obesity   . Sleep apnea    cpap    Assessment/Plan:   2 Days Post-Op Procedure(s) (LRB): TOTAL KNEE ARTHROPLASTY (Left) Active Problems:   Knee joint replacement status, left   Acute post op blood loss anemia   Estimated body mass index is 62 kg/m as calculated from the following:   Height as of this encounter: 5\' 3"  (1.6 m).   Weight as of this encounter: 158.8 kg. Advance diet Up with therapy  Needs BM -give milk of mag.  Continue with Colace.  May need enema VSS Pain controlled Acute post op blood loss anemia - Iron supplement.  Recheck hemoglobin this afternoon, if continuing to drop we will transfuse 1 unit of packed red blood cells.  We will also monitor for any symptomatic anemia while participating with PT. CM to assist with discharge to home with home health PT  DVT Prophylaxis - Lovenox and Foot Pumps Weight-Bearing as tolerated to left leg   T. , PA-C [facility] 05/28/2019, 8:46 AM

## 2019-05-28 NOTE — Discharge Instructions (Signed)

## 2019-05-29 LAB — PREPARE RBC (CROSSMATCH)

## 2019-05-29 LAB — HEMOGLOBIN: Hemoglobin: 7.4 g/dL — ABNORMAL LOW (ref 12.0–15.0)

## 2019-05-29 LAB — HEMOGLOBIN AND HEMATOCRIT, BLOOD
HCT: 26.9 % — ABNORMAL LOW (ref 36.0–46.0)
Hemoglobin: 8.2 g/dL — ABNORMAL LOW (ref 12.0–15.0)

## 2019-05-29 MED ORDER — SODIUM CHLORIDE 0.9% IV SOLUTION: Freq: Once | INTRAVENOUS | Status: AC

## 2019-05-29 MED ORDER — FLEET ENEMA 7-19 GM/118ML RE ENEM
1.0000 | ENEMA | Freq: Once | RECTAL | Status: AC
  Administered 2019-05-29: 1 via RECTAL

## 2019-05-29 MED ORDER — MAGNESIUM HYDROXIDE 400 MG/5ML PO SUSP
30.0000 mL | Freq: Once | ORAL | Status: AC
  Administered 2019-05-29: 30 mL via ORAL
  Filled 2019-05-29: qty 30

## 2019-05-29 NOTE — Progress Notes (Signed)
Physical Therapy Treatment Patient Details Name: [name] [name] MRN: [id number] DOB: [birth year] Today's Date: 05/29/2019    History of Present Illness [name] [name] is a 48yoF who comes to [facility] for elective Left TKA. Pt was recently here for Rt TKA 11/06/18, Lt THA (direct anterior) 01/29/19.    PT Comments    Pt in bed upon entry,reports adequate pain control. Author retrieved bari BSC for potential BM this date. Pt assisted with therex in bed, then bed mobility to EOB at supervision level, and STS transfer with RW at supervision level. Pt requires customization for mobility strategies d/t large habitus and high body weight, but is fluent in these techniques, all appear safe, and will easy translate to mobility performance in home. Noted Hb drop since yesterday, orthopedic team planning for transfusion this date. Pt reports generalized fatigue, feelings of hotness/flush. Will defer AMB and higher exertion activity until after unit of PRBC has been delivered. Pt continues to progress well in general.     Follow Up Recommendations  Home health PT;Follow surgeon's recommendation for DC plan and follow-up therapies;Supervision for mobility/OOB     Equipment Recommendations  None recommended by PT    Recommendations for Other Services       Precautions / Restrictions Precautions Precautions: Knee;Fall Precaution Booklet Issued: Yes (comment) Restrictions LLE Weight Bearing: Weight bearing as tolerated    Mobility  Bed Mobility Overal bed mobility: Needs Assistance Bed Mobility: Supine to Sit;Sit to Supine     Supine to sit: HOB elevated;Min assist     General bed mobility comments: MinA provided to Left leg OOB for pain control, although pt is able to perform without assist  Transfers Overall transfer level: Needs assistance Equipment used: Rolling walker (2 wheeled) Transfers: Sit to/from Stand Sit to Stand: Supervision         General transfer comment: much more calculated  and controlled.  Ambulation/Gait Ambulation/Gait assistance: (deferred at this time, awaiting transfusion.)               Stairs             Wheelchair Mobility    Modified Rankin (Stroke Patients Only)       Balance Overall balance assessment: No apparent balance deficits (not formally assessed);Modified Independent                                          Cognition Arousal/Alertness: Awake/alert Behavior During Therapy: WFL for tasks assessed/performed Overall Cognitive Status: Within Functional Limits for tasks assessed                                 General Comments: Pt A and O x 4      Exercises Total Joint Exercises Ankle Circles/Pumps: AROM;20 reps;Supine;Both Heel Slides: AAROM;Supine;Right;Limitations;AROM;15 reps Hip ABduction/ADduction: AAROM;Supine;15 reps;Both    General Comments        Pertinent Vitals/Pain Pain Score: 2 (worse to grimacing crying with Left SAQ) Pain Location: operative knee Pain Descriptors / Indicators: Constant;Aching;Sore Pain Intervention(s): Limited activity within patient's tolerance;Monitored during session;RN gave pain meds during session    Home Living                      Prior Function            PT Goals (current goals can  now be found in the care plan section) Acute Rehab PT Goals Patient Stated Goal: Return to home and improve independence there PT Goal Formulation: With patient Time For Goal Achievement: 06/10/19 Potential to Achieve Goals: Good Progress towards PT goals: Progressing toward goals    Frequency    BID      PT Plan Current plan remains appropriate    Co-evaluation              AM-PAC PT "6 Clicks" Mobility   Outcome Measure  Help needed turning from your back to your side while in a flat bed without using bedrails?: A Little Help needed moving from lying on your back to sitting on the side of a flat bed without using  bedrails?: A Little Help needed moving to and from a bed to a chair (including a wheelchair)?: A Little Help needed standing up from a chair using your arms (e.g., wheelchair or bedside chair)?: A Little Help needed to walk in hospital room?: A Little Help needed climbing 3-5 steps with a railing? : A Little 6 Click Score: 18    End of Session   Activity Tolerance: Patient tolerated treatment well;Treatment limited secondary to medical complications (Comment);Patient limited by fatigue Patient left: with nursing/sitter in room;Other (comment)(BSC) Nurse Communication: Mobility status PT Visit Diagnosis: Other abnormalities of gait and mobility (R26.89);Difficulty in walking, not elsewhere classified (R26.2)     Time: 8466-5993 PT Time Calculation (min) (ACUTE ONLY): 33 min  Charges:  $Therapeutic Exercise: 23-37 mins                     9:54 AM, 05/29/19 Etta Grandchild, PT, DPT Physical Therapist - [facility]  [phone] ([place])    [place] C 05/29/2019, 9:51 AM

## 2019-05-29 NOTE — Progress Notes (Signed)
Physical Therapy Treatment Patient Details Name: [name] [name] MRN: [id number] DOB: Oct 16, 1970 Today's Date: 05/29/2019    History of Present Illness [name] [name] is a 89yoF who comes to [facility] for elective Left TKA. Pt was recently here for Rt TKA 11/06/18, Lt THA (direct anterior) 01/29/19.    PT Comments    Author returning for 2nd PT treatment this date, had waited until s/p 1unit PRBC administered. Pt reports feeling a little better, expresses some reluctance to AMB for fear of increasing soreness prior to DC home. Pt requires supervision/set-up for modI mobility to EOB with bedsheet self assist of LLE. Supervision for rising to standing with RW from slight elevated surfaced. Pt AMB ~36ft, fairly limited by DOE/fatigue, requires 3-4 rest breaks standing and moving a gait speed slightly slower than 2DA, albeit not significantly different statistically. Pt reports fatigue as a major limiter to performance. Pt elects to end session on BSC, as she has not had a bowel movement as of yet. Pt left up with callbell in reach. Pt encouraged to stay overnight if she feels physically unsafe performing mobility required to enter home, this option made clear by ortho PA in hallway.    Follow Up Recommendations  Home health PT;Follow surgeon's recommendation for DC plan and follow-up therapies;Supervision for mobility/OOB     Equipment Recommendations  None recommended by PT    Recommendations for Other Services       Precautions / Restrictions Precautions Precautions: Knee;Fall Precaution Booklet Issued: Yes (comment) Restrictions LLE Weight Bearing: Weight bearing as tolerated    Mobility  Bed Mobility Overal bed mobility: Needs Assistance Bed Mobility: Supine to Sit     Supine to sit: HOB elevated;Supervision     General bed mobility comments: offered  asheet for self assisst to EOB  Transfers Overall transfer level: Needs assistance Equipment used: Rolling walker (2  wheeled) Transfers: Sit to/from Stand Sit to Stand: Supervision;From elevated surface         General transfer comment: heavy effort required  Ambulation/Gait   Gait Distance (Feet): 70 Feet Assistive device: Rolling walker (2 wheeled) Gait Pattern/deviations: Step-to pattern Gait velocity: 0.68m/s (0.70m/s on 2/3 POD1)   General Gait Details: multiple pauses to rest, quite SOB, fatigued.   Stairs             Wheelchair Mobility    Modified Rankin (Stroke Patients Only)       Balance Overall balance assessment: No apparent balance deficits (not formally assessed);Modified Independent                                          Cognition Arousal/Alertness: Awake/alert Behavior During Therapy: WFL for tasks assessed/performed Overall Cognitive Status: Within Functional Limits for tasks assessed                                 General Comments: Pt A and O x 4      Exercises Other Exercises Other Exercises: Seated LLE heel slides on pillowcase 1x15 (ROM amplitude quite poor) Other Exercises: Seated ankle pumps 1x15 bilat (requires self-assist with arms LLE)    General Comments        Pertinent Vitals/Pain Pain Assessment: Faces Faces Pain Scale: Hurts little more Pain Location: operative knee Pain Descriptors / Indicators: Constant;Aching;Sore Pain Intervention(s): Limited activity within patient's tolerance;Monitored during session;Premedicated before  session;Repositioned    Home Living                      Prior Function            PT Goals (current goals can now be found in the care plan section) Acute Rehab PT Goals Patient Stated Goal: Return to home and improve independence there PT Goal Formulation: With patient Time For Goal Achievement: 06/10/19 Potential to Achieve Goals: Good Progress towards PT goals: Not progressing toward goals - comment    Frequency    BID      PT Plan Current plan  remains appropriate    Co-evaluation              AM-PAC PT "6 Clicks" Mobility   Outcome Measure  Help needed turning from your back to your side while in a flat bed without using bedrails?: A Little Help needed moving from lying on your back to sitting on the side of a flat bed without using bedrails?: A Little Help needed moving to and from a bed to a chair (including a wheelchair)?: A Little Help needed standing up from a chair using your arms (e.g., wheelchair or bedside chair)?: A Little Help needed to walk in hospital room?: A Little Help needed climbing 3-5 steps with a railing? : A Little 6 Click Score: 18    End of Session Equipment Utilized During Treatment: Gait belt Activity Tolerance: Patient limited by fatigue Patient left: in chair;with call bell/phone within reach;Other (comment)(on BSC) Nurse Communication: Mobility status PT Visit Diagnosis: Other abnormalities of gait and mobility (R26.89);Difficulty in walking, not elsewhere classified (R26.2)     Time: 4193-7902 PT Time Calculation (min) (ACUTE ONLY): 22 min  Charges:  $Gait Training: 8-22 mins                     5:12 PM, 05/29/19 Rosamaria Lints, PT, DPT Physical Therapist - [facility]  [phone] ([facility])    Lindsie Simar C 05/29/2019, 5:08 PM

## 2019-05-29 NOTE — Progress Notes (Addendum)
   Subjective: 3 Days Post-Op Procedure(s) (LRB): TOTAL KNEE ARTHROPLASTY (Left) Patient reports pain as mild.   Patient is well, and has had no acute complaints or problems Denies any CP, SOB, ABD pain.  No dizziness or lightheadedness.  Vital signs are stable.  Excellent progress with physical therapy yesterday. We will continue with physical therapy today.  Plan is to go Home after hospital stay.  Objective: Vital signs in last 24 hours: Temp:  [98.4 F (36.9 C)-99.5 F (37.5 C)] 99.5 F (37.5 C) (02/05 0000) Pulse Rate:  [88-89] 88 (02/05 0000) Resp:  [16-17] 16 (02/05 0000) BP: (137-143)/(64-75) 137/75 (02/05 0000) SpO2:  [98 %-100 %] 98 % (02/05 0000)  Intake/Output from previous day: 02/04 0701 - 02/05 0700 In: 480 [P.O.:480] Out: 1300 [Urine:1300] Intake/Output this shift: No intake/output data recorded.  Recent Labs    05/26/19 1707 05/27/19 0627 05/28/19 0333 05/28/19 1350 05/29/19 0434  HGB 10.6* 8.6* 7.7* 7.9* 7.4*   Recent Labs    05/28/19 0333 05/28/19 1350  WBC 9.8 10.6*  RBC 3.20* 3.25*  HCT 25.9* 26.3*  PLT 228 221   Recent Labs    05/26/19 1707 05/27/19 0627  NA  --  135  K  --  4.0  CL  --  100  CO2  --  26  BUN  --  15  CREATININE 0.72 0.75  GLUCOSE  --  144*  CALCIUM  --  7.9*   No results for input(s): LABPT, INR in the last 72 hours.  EXAM General - Patient is Alert, Appropriate and Oriented Extremity - Sensation intact distally Intact pulses distally Dorsiflexion/Plantar flexion intact No cellulitis present Compartment soft  Negative Homans' sign Dressing - dressing C/D/I and no drainage, prevena intact with out drainage Motor Function - intact, moving foot and toes well on exam.   Past Medical History:  Diagnosis Date  . Appendicitis 08/16/2016  . Arthritis   . Atrial tachycardia, paroxysmal (HCC)    a. s/p ablation in 2009 by Dr. Chales Abrahams in [facility] (falied); b. Managed w/ beta blocker.  Marland Kitchen GERD (gastroesophageal  reflux disease)   . Hypertension   . LBBB (left bundle branch block)    atrial tach.  . Obesity   . Sleep apnea    cpap    Assessment/Plan:   3 Days Post-Op Procedure(s) (LRB): TOTAL KNEE ARTHROPLASTY (Left) Active Problems:   Knee joint replacement status, left   Acute post op blood loss anemia   Estimated body mass index is 62 kg/m as calculated from the following:   Height as of this encounter: 5\' 3"  (1.6 m).   Weight as of this encounter: 158.8 kg. Advance diet Up with therapy  Needs BM -give milk of mag.  Continue with Colace.  May need enema VSS Pain controlled Acute post op blood loss anemia - Iron supplement.  Transfuse 1 unit of packed red blood cells today. CM to assist with discharge to home with home health PT  DVT Prophylaxis - Lovenox and Foot Pumps Weight-Bearing as tolerated to left leg   T. , PA-C [facility] 05/29/2019, 8:09 AM   Patient still has not had BM and therapy recommended 1 more session.  With her size and postop anemia I think an additional day is indicated for safety.  She will have therapy in the morning check her hemoglobin and if she had a BM and is otherwise stable plan discharge to home tomorrow with outpatient therapy.

## 2019-05-30 LAB — CBC
HCT: 26.8 % — ABNORMAL LOW (ref 36.0–46.0)
Hemoglobin: 8.1 g/dL — ABNORMAL LOW (ref 12.0–15.0)
MCH: 25.2 pg — ABNORMAL LOW (ref 26.0–34.0)
MCHC: 30.2 g/dL (ref 30.0–36.0)
MCV: 83.2 fL (ref 80.0–100.0)
Platelets: 244 10*3/uL (ref 150–400)
RBC: 3.22 MIL/uL — ABNORMAL LOW (ref 3.87–5.11)
RDW: 19.7 % — ABNORMAL HIGH (ref 11.5–15.5)
WBC: 9.4 10*3/uL (ref 4.0–10.5)
nRBC: 0.5 % — ABNORMAL HIGH (ref 0.0–0.2)

## 2019-05-30 LAB — TYPE AND SCREEN
ABO/RH(D): O POS
Antibody Screen: NEGATIVE
Unit division: 0
Unit division: 0
Unit division: 0

## 2019-05-30 LAB — BPAM RBC
Blood Product Expiration Date: 202103012359
Blood Product Expiration Date: 202103012359
Blood Product Expiration Date: 202103012359
ISSUE DATE / TIME: 202102020938
ISSUE DATE / TIME: 202102020938
ISSUE DATE / TIME: 202102051245
Unit Type and Rh: 5100
Unit Type and Rh: 5100
Unit Type and Rh: 5100

## 2019-05-30 LAB — HEMOGLOBIN: Hemoglobin: 8 g/dL — ABNORMAL LOW (ref 12.0–15.0)

## 2019-05-30 NOTE — Progress Notes (Addendum)
   Subjective: 4 Days Post-Op Procedure(s) (LRB): TOTAL KNEE ARTHROPLASTY (Left) Patient reports pain as mild.   Patient is well, and has had no acute complaints or problems Denies any CP, SOB, ABD pain.  No dizziness or lightheadedness.  Yesterday after transfusion fatigue improved.  She denies any fatigue today.  Vital signs are stable.  Making good progress with PT. We will continue with physical therapy today.  Plan is to go Home after hospital stay.  Objective: Vital signs in last 24 hours: Temp:  [98 F (36.7 C)-99 F (37.2 C)] 98 F (36.7 C) (02/06 0810) Pulse Rate:  [79-122] 81 (02/06 0810) Resp:  [16-20] 17 (02/06 0810) BP: (119-147)/(47-80) 139/66 (02/06 0810) SpO2:  [97 %-100 %] 97 % (02/06 0810)  Intake/Output from previous day: 02/05 0701 - 02/06 0700 In: 570 [I.V.:220; Blood:350] Out: 2000 [Urine:2000] Intake/Output this shift: No intake/output data recorded.  Recent Labs    05/28/19 0333 05/28/19 1350 05/29/19 0434 05/29/19 1606 05/30/19 0707  HGB 7.7* 7.9* 7.4* 8.2* 8.0*   Recent Labs    05/28/19 0333 05/28/19 0333 05/28/19 1350 05/29/19 1606  WBC 9.8  --  10.6*  --   RBC 3.20*  --  3.25*  --   HCT 25.9*   < > 26.3* 26.9*  PLT 228  --  221  --    < > = values in this interval not displayed.   No results for input(s): NA, K, CL, CO2, BUN, CREATININE, GLUCOSE, CALCIUM in the last 72 hours. No results for input(s): LABPT, INR in the last 72 hours.  EXAM General - Patient is Alert, Appropriate and Oriented Extremity - Sensation intact distally Intact pulses distally Dorsiflexion/Plantar flexion intact No cellulitis present Compartment soft  Negative Homans' sign bilaterally Dressing - dressing C/D/I and no drainage, prevena intact with out drainage Motor Function - intact, moving foot and toes well on exam.   Past Medical History:  Diagnosis Date  . Appendicitis 08/16/2016  . Arthritis   . Atrial tachycardia, paroxysmal (HCC)    a. s/p  ablation in 2009 by Dr. Chales Abrahams in [facility] (falied); b. Managed w/ beta blocker.  Marland Kitchen GERD (gastroesophageal reflux disease)   . Hypertension   . LBBB (left bundle branch block)    atrial tach.  . Obesity   . Sleep apnea    cpap    Assessment/Plan:   4 Days Post-Op Procedure(s) (LRB): TOTAL KNEE ARTHROPLASTY (Left) Active Problems:   Knee joint replacement status, left   Acute post op blood loss anemia   Estimated body mass index is 62 kg/m as calculated from the following:   Height as of this encounter: 5\' 3"  (1.6 m).   Weight as of this encounter: 158.8 kg. Advance diet Up with therapy  VSS Pain well controlled Acute post op blood loss anemia -status post 1 unit of packed red blood cells 11/26/2019.  Hemoglobin stable at 8.0 this morning.  Will continue with iron supplement.  Check progress with PT and monitor for any signs of fatigue. Recheck Hgb this afternoon, if Hgb drops will give 2nd unit of PRBC. CM to assist with discharge to home with home health PT  DVT Prophylaxis - Lovenox and Foot Pumps Weight-Bearing as tolerated to left leg   T. 01/26/2020, PA-C [facility] 05/30/2019, 8:39 AM

## 2019-05-30 NOTE — Progress Notes (Signed)
Physical Therapy Treatment Patient Details Name: [name] [name] MRN: [id number] DOB: Jul 08, 1970 Today's Date: 05/30/2019    History of Present Illness [name] [name] is a 48yoF who comes to [facility] for elective Left TKA. Pt was recently here for Rt TKA 11/06/18, Lt THA (direct anterior) 01/29/19.    PT Comments    Patient declined OOB and mobility activities, expressing preference for therex only this PM.  Assisted with supine L LE therex, act assist required at end ranges of hip/knee flexion due to adiposity.  Did verbally review car transfer technique and upcoming discharge plan; patient comfortable with all information and eager for discharge home when medically appropriate. Of note, patient to benefit from extender strap to polar care sleeve.  Unable to contact central supply; will continue efforts next date.   Follow Up Recommendations  Home health PT     Equipment Recommendations       Recommendations for Other Services       Precautions / Restrictions Precautions Precautions: Knee;Fall Restrictions Weight Bearing Restrictions: Yes LLE Weight Bearing: Weight bearing as tolerated    Mobility  Bed Mobility               General bed mobility comments: patient declined, expressing preference for therex only this PM  Transfers                 General transfer comment: patient declined, expressing preference for therex only this PM  Ambulation/Gait             General Gait Details: patient declined, expressing preference for therex only this PM   Stairs             Wheelchair Mobility    Modified Rankin (Stroke Patients Only)       Balance                                            Cognition Arousal/Alertness: Awake/alert Behavior During Therapy: WFL for tasks assessed/performed Overall Cognitive Status: Within Functional Limits for tasks assessed                                        Exercises Other  Exercises Other Exercises: Supine L LE therex, 2x15, active ROM: ankle pumps, quad sets, heel slides, hip abduct/adduct and SLR (act assist) for strength/flexibility.    General Comments        Pertinent Vitals/Pain Pain Assessment: Faces Faces Pain Scale: Hurts little more Pain Location: L knee Pain Descriptors / Indicators: Aching;Guarding;Grimacing Pain Intervention(s): Limited activity within patient's tolerance;Monitored during session;Repositioned;Patient requesting pain meds-RN notified    Home Living                      Prior Function            PT Goals (current goals can now be found in the care plan section) Acute Rehab PT Goals Patient Stated Goal: " I want to go home once my HGB is stable" PT Goal Formulation: With patient Time For Goal Achievement: 06/10/19 Potential to Achieve Goals: Good Progress towards PT goals: Progressing toward goals    Frequency    BID      PT Plan Current plan remains appropriate    Co-evaluation  AM-PAC PT "6 Clicks" Mobility   Outcome Measure  Help needed turning from your back to your side while in a flat bed without using bedrails?: A Little Help needed moving from lying on your back to sitting on the side of a flat bed without using bedrails?: A Little Help needed moving to and from a bed to a chair (including a wheelchair)?: A Little Help needed standing up from a chair using your arms (e.g., wheelchair or bedside chair)?: A Little Help needed to walk in hospital room?: A Little Help needed climbing 3-5 steps with a railing? : A Little 6 Click Score: 18    End of Session   Activity Tolerance: Patient tolerated treatment well Patient left: in bed;with call bell/phone within reach;with bed alarm set Nurse Communication: Mobility status PT Visit Diagnosis: Other abnormalities of gait and mobility (R26.89);Difficulty in walking, not elsewhere classified (R26.2)     Time: 0086-7619 PT  Time Calculation (min) (ACUTE ONLY): 33 min  Charges:  $Therapeutic Exercise: 23-37 mins                     Jelani Vreeland H. Owens Shark, PT, DPT, NCS 05/30/19, 4:50 PM [phone]

## 2019-05-30 NOTE — Progress Notes (Signed)
Physical Therapy Treatment Patient Details Name: [name] [name] MRN: [id number] DOB: Jan 30, 1971 Today's Date: 05/30/2019    History of Present Illness [name] [name] is a 48yoF who comes to [facility] for elective Left TKA. Pt was recently here for Rt TKA 11/06/18, Lt THA (direct anterior) 01/29/19.    PT Comments    Pt was supine in bed upon arriving. She agrees to PT session and is cooperative and motivated throughout. Reports 2/10 pain and had been pre-medicated prior to PT session. She was able to exit R side of bed with CGA for support and stabilizing LLE. Pt requested to use BR and was able to stand and step to [facility] without physical assistance from slightly elevated bed height. She then ambulated 100 ft with RW with supervision. Overall tolerated well but is limited by fatigue. HR elevated to 120 with O2 in upper 90s throughout but with standing rest HR decreases as expected. Pt returned to room and performed EOB and supine exercises. Will measure ROM this afternoon in L knee but continues to improve. She tolerated exercises and OOB activity well. Will continue to monitor Hgb. PT will continue to progress per POC.     Follow Up Recommendations  Home health PT     Equipment Recommendations  None recommended by PT    Recommendations for Other Services       Precautions / Restrictions Precautions Precautions: Knee;Fall Precaution Booklet Issued: Yes (comment) Restrictions Weight Bearing Restrictions: No LLE Weight Bearing: Weight bearing as tolerated    Mobility  Bed Mobility Overal bed mobility: Needs Assistance Bed Mobility: Supine to Sit;Sit to Supine     Supine to sit: HOB elevated;Min guard Sit to supine: Min assist   General bed mobility comments: Pt exited R side of bed with increased time and CGA on LLE for stabilizing. She required min assist to return to supine after performing gait and OOB activity.  Transfers Overall transfer level: Needs assistance Equipment used:  Rolling walker (2 wheeled) Transfers: Sit to/from Stand Sit to Stand: Supervision;From elevated surface         General transfer comment: Pt demonstartes safe ability to stand from EOB and from [facility]. she required no lifting assistance and demonstrated safe technique.   Ambulation/Gait Ambulation/Gait assistance: Supervision Gait Distance (Feet): 100 Feet Assistive device: Rolling walker (2 wheeled) Gait Pattern/deviations: Step-to pattern Gait velocity: decreased   General Gait Details: multiple pauses to rest, quite SOB, fatigued however stable and tolerated well.   Stairs             Wheelchair Mobility    Modified Rankin (Stroke Patients Only)       Balance                                            Cognition Arousal/Alertness: Awake/alert Behavior During Therapy: WFL for tasks assessed/performed Overall Cognitive Status: Within Functional Limits for tasks assessed                                 General Comments: Pt A and O x 4      Exercises Total Joint Exercises Ankle Circles/Pumps: AROM;20 reps;Supine;Both Quad Sets: AROM;Left;10 reps;Supine Gluteal Sets: Strengthening;Both;Supine;10 reps Short Arc Quad: AAROM;Supine;Both;10 reps Heel Slides: AAROM;Supine;Right;Limitations;AROM;15 reps Heel Slides Limitations: Heel slides performed while pt was seated EOB with pillowcase under  LLE to decrease friction on floor. pt performed AROM 20 reps prior to therapist AAROm to promote increased knee flex. she tolerated well Straight Leg Raises: AAROM;10 reps;Supine Long Arc Quad: Seated;10 reps;Left;AAROM Goniometric ROM: (will perform this afternoon)    General Comments        Pertinent Vitals/Pain Pain Assessment: 0-10 Pain Score: 2  Faces Pain Scale: Hurts a little bit Pain Location: operative knee Pain Descriptors / Indicators: Constant;Aching;Sore Pain Intervention(s): Limited activity within patient's  tolerance;Monitored during session;Premedicated before session;Ice applied    Home Living                      Prior Function            PT Goals (current goals can now be found in the care plan section) Acute Rehab PT Goals Patient Stated Goal: " I want to go home once my HGB is stable" Progress towards PT goals: Progressing toward goals    Frequency    BID      PT Plan Current plan remains appropriate    Co-evaluation              AM-PAC PT "6 Clicks" Mobility   Outcome Measure  Help needed turning from your back to your side while in a flat bed without using bedrails?: A Little Help needed moving from lying on your back to sitting on the side of a flat bed without using bedrails?: A Little Help needed moving to and from a bed to a chair (including a wheelchair)?: A Little Help needed standing up from a chair using your arms (e.g., wheelchair or bedside chair)?: A Little Help needed to walk in hospital room?: A Little Help needed climbing 3-5 steps with a railing? : A Little 6 Click Score: 18    End of Session Equipment Utilized During Treatment: Gait belt Activity Tolerance: Patient tolerated treatment well;Patient limited by fatigue Patient left: in bed;with call bell/phone within reach;with bed alarm set;with SCD's reapplied Nurse Communication: Mobility status PT Visit Diagnosis: Other abnormalities of gait and mobility (R26.89);Difficulty in walking, not elsewhere classified (R26.2)     Time: 2800-3491 PT Time Calculation (min) (ACUTE ONLY): 30 min  Charges:  $Gait Training: 8-22 mins $Therapeutic Exercise: 8-22 mins                     [name] PTA 05/30/19, 12:32 PM

## 2019-05-31 LAB — CBC
HCT: 27.7 % — ABNORMAL LOW (ref 36.0–46.0)
Hemoglobin: 8.3 g/dL — ABNORMAL LOW (ref 12.0–15.0)
MCH: 24.9 pg — ABNORMAL LOW (ref 26.0–34.0)
MCHC: 30 g/dL (ref 30.0–36.0)
MCV: 83.2 fL (ref 80.0–100.0)
Platelets: 256 10*3/uL (ref 150–400)
RBC: 3.33 MIL/uL — ABNORMAL LOW (ref 3.87–5.11)
RDW: 20.3 % — ABNORMAL HIGH (ref 11.5–15.5)
WBC: 9.2 10*3/uL (ref 4.0–10.5)
nRBC: 0.4 % — ABNORMAL HIGH (ref 0.0–0.2)

## 2019-05-31 MED ORDER — TRAMADOL HCL 50 MG PO TABS
50.0000 mg | ORAL_TABLET | Freq: Once | ORAL | Status: AC
  Administered 2019-05-31: 50 mg via ORAL
  Filled 2019-05-31: qty 1

## 2019-05-31 NOTE — Progress Notes (Signed)
   Subjective: 5 Days Post-Op Procedure(s) (LRB): TOTAL KNEE ARTHROPLASTY (Left) Patient reports pain as mild.   Patient is well, and has had no acute complaints or problems Denies any CP, SOB, ABD pain.  No dizziness or lightheadedness. She denies any fatigue today.  Vital signs are stable.  Making good progress with PT. We will continue with physical therapy today.  Plan is to go Home after hospital stay.  Objective: Vital signs in last 24 hours: Temp:  [98 F (36.7 C)-98.7 F (37.1 C)] 98 F (36.7 C) (02/07 0749) Pulse Rate:  [76-90] 76 (02/07 0749) Resp:  [16-17] 17 (02/07 0749) BP: (126-146)/(55-57) 126/56 (02/07 0749) SpO2:  [96 %-97 %] 97 % (02/07 0749)  Intake/Output from previous day: 02/06 0701 - 02/07 0700 In: -  Out: 2300 [Urine:2300] Intake/Output this shift: No intake/output data recorded.  Recent Labs    05/28/19 1350 05/29/19 0434 05/29/19 1606 05/30/19 0707 05/30/19 1412  HGB 7.9* 7.4* 8.2* 8.0* 8.1*   Recent Labs    05/28/19 1350 05/28/19 1350 05/29/19 1606 05/30/19 1412  WBC 10.6*  --   --  9.4  RBC 3.25*  --   --  3.22*  HCT 26.3*   < > 26.9* 26.8*  PLT 221  --   --  244   < > = values in this interval not displayed.   No results for input(s): NA, K, CL, CO2, BUN, CREATININE, GLUCOSE, CALCIUM in the last 72 hours. No results for input(s): LABPT, INR in the last 72 hours.  EXAM General - Patient is Alert, Appropriate and Oriented Extremity - Sensation intact distally Intact pulses distally Dorsiflexion/Plantar flexion intact No cellulitis present Compartment soft  Negative Homans' sign bilaterally Dressing - dressing C/D/I and no drainage, prevena intact with out drainage Motor Function - intact, moving foot and toes well on exam.   Past Medical History:  Diagnosis Date  . Appendicitis 08/16/2016  . Arthritis   . Atrial tachycardia, paroxysmal (HCC)    a. s/p ablation in 2009 by Dr. Chales Abrahams in [facility] (falied); b. Managed w/ beta  blocker.  Marland Kitchen GERD (gastroesophageal reflux disease)   . Hypertension   . LBBB (left bundle branch block)    atrial tach.  . Obesity   . Sleep apnea    cpap    Assessment/Plan:   5 Days Post-Op Procedure(s) (LRB): TOTAL KNEE ARTHROPLASTY (Left) Active Problems:   Knee joint replacement status, left   Acute post op blood loss anemia   Estimated body mass index is 62 kg/m as calculated from the following:   Height as of this encounter: 5\' 3"  (1.6 m).   Weight as of this encounter: 158.8 kg. Advance diet Up with therapy  VSS Pain controlled Acute post op blood loss anemia -status post 1 unit of packed red blood cells 11/26/2019.  Hemoglobin stable yesterday.  Hemoglobin pending this morning.  Continue with iron supplements We will plan on discharging home today as long as patient does well with PT this morning and hemoglobin is stable or trending up.  DVT Prophylaxis - Lovenox and Foot Pumps Weight-Bearing as tolerated to left leg   T. 01/26/2020, PA-C [facility] 05/31/2019, 9:00 AM

## 2019-05-31 NOTE — Progress Notes (Signed)
Patient discharged to personal vehicle , picked up by husband and son with hard rx, belongings , discharge summary . Iv removed from LFA

## 2019-05-31 NOTE — Progress Notes (Addendum)
PT Cancellation Note  Patient Details Name: [name] [name] MRN: [id number] DOB: 1970/05/25   Cancelled Treatment:    Reason Eval/Treat Not Completed: Other (comment)   Offered session this am.  Pt stated he pain was not controlled and wanted to defer therapy until pain medication.  Anticipates discharge today.   ASCOM number left for pt to call when she ready.  Pt called later in am to state she was going home and they she would not need session today.     Danielle Dess 05/31/2019, 12:12 PM

## 2019-06-14 ENCOUNTER — Encounter: Payer: Self-pay | Admitting: *Deleted

## 2019-08-28 ENCOUNTER — Telehealth: Payer: Self-pay | Admitting: Cardiovascular Disease

## 2019-08-28 MED ORDER — FUROSEMIDE 20 MG PO TABS: 20.0000 mg | ORAL_TABLET | Freq: Every day | ORAL | 1 refills | Status: DC | PRN

## 2019-08-28 NOTE — Telephone Encounter (Signed)
Patient requesting furosemide refill because she states since returning to work after her knee replacement she has been having some swelling after standing and would like to see if this will help. She states the last time she filled this was in 2019. It has been D/C'ed from her med list. Please advise.

## 2019-08-28 NOTE — Telephone Encounter (Signed)
Patient made aware of Dr. Jari Sportsman recommendation. Rx for Lasix 20 mg daily prn #30 R-1 sent to the patients pharmacy as requested.

## 2019-08-28 NOTE — Telephone Encounter (Signed)
To Dr. Arida to review.   

## 2019-08-28 NOTE — Telephone Encounter (Signed)
We can refill furosemide 20 mg daily as needed.

## 2019-08-28 NOTE — Telephone Encounter (Signed)
*  STAT* If patient is at the pharmacy, call can be transferred to refill team.   1. Which medications need to be refilled? (please list name of each medication and dose if known) fursreomide 20 mg (as needed)  2. Which pharmacy/location (including street and city if local pharmacy) is medication to be sent to? [facility] in [place]  3. Do they need a 30 day or 90 day supply? 30

## 2019-10-20 ENCOUNTER — Other Ambulatory Visit: Payer: Self-pay | Admitting: Cardiovascular Disease

## 2019-12-30 ENCOUNTER — Other Ambulatory Visit: Payer: Self-pay | Admitting: Cardiovascular Disease

## 2020-01-04 NOTE — Progress Notes (Signed)
Cardiology Office Note    Date:  01/08/2020   ID:  [name], [name] December 07, 1970, MRN [id number]  PCP:  Olive Bass, MD  Cardiologist:  Lorine Bears, MD  Electrophysiologist:  None   Chief Complaint: Follow up  History of Present Illness:   [name] [name] is a 49 y.o. female with history of atrial tachycardia, LBBB, HTN, obesity, OSA on CPAP, and GERD who presents for follow up of atrial tachycardia.  She was diagnosed with atrial tachycardia in 2009 and underwent successful ablation by Dr. Chales Abrahams in [facility]. Following this, she developed a LBBB. Echo in 2013 showed an EF of 55-60%, no RWMA, normal LV diastolic function parameters, PASP 38 mmHg. She was last seen virtually in 12/2018, and was doing well from a cardiac perspective. Since she was last seen, she has undergone a left total hip arthroplasty in 01/2019 and a left total knee arthroplasty (prior right knee TKA in 11/2018).  Following this, she requested a refill on Lasix 20 mg as needed secondary to lower extremity swelling with return to work.  She comes in today and is doing reasonably well.  She notes since her most recent left TKA she has continued to have waxing and waning lower extremity swelling as well as fluctuations in weight and dyspnea.  She has typically been taking her Lasix 3-4 times per week and notes if she misses much more than this her weight will trend up into the 390s and her dyspnea worsens.  The swelling in the left lower extremity is typically most noticeable after she is sitting down at her desk at work for the afternoon.  No chest pain or palpitations.  She is hoping to resume her exercise regimen with her elliptical and is working on intermittent fasting to assist with weight loss.  She is watching her sodium intake though eats out at restaurants 4-5 times per week.  She does not apply salt to food if she cooks it.   Labs independently reviewed: 09/2019 - A1c 5.3 08/2019 - direct LDL 112, TC 185, TG 178,  HDL 54 05/2019 - HGB 10.9, PLT 470, potassium 4.0, BUN 15, serum creatinine 0.75 04/2019 - albumin 3.9, AST/ALT normal  Past Medical History:  Diagnosis Date  . Appendicitis 08/16/2016  . Arthritis   . Atrial tachycardia, paroxysmal (HCC)    a. s/p ablation in 2009 by Dr. Chales Abrahams in [facility] (falied); b. Managed w/ beta blocker.  Marland Kitchen GERD (gastroesophageal reflux disease)   . Hypertension   . LBBB (left bundle branch block)    atrial tach.  . Obesity   . Sleep apnea    cpap    Past Surgical History:  Procedure Laterality Date  . ABLATION OF DYSRHYTHMIC FOCUS  2009  . ANTERIOR CRUCIATE LIGAMENT REPAIR    . APPLICATION OF WOUND VAC Right 11/06/2018   Procedure: APPLICATION OF WOUND VAC;  Surgeon: Kennedy Bucker, MD;  Location: [facility] ORS;  Service: Orthopedics;  Laterality: Right;  [id number]  . LAPAROSCOPIC APPENDECTOMY N/A 08/16/2016   Procedure: APPENDECTOMY LAPAROSCOPIC;  Surgeon: Lattie Haw, MD;  Location: [facility] ORS;  Service: General;  Laterality: N/A;  . TONSILLECTOMY    . TOTAL HIP ARTHROPLASTY Left 01/29/2019   Procedure: TOTAL HIP ARTHROPLASTY ANTERIOR APPROACH;  Surgeon: Kennedy Bucker, MD;  Location: [facility] ORS;  Service: Orthopedics;  Laterality: Left;  . TOTAL KNEE ARTHROPLASTY Right 11/06/2018   Procedure: RIGHT TOTAL KNEE ARTHROPLASTY;  Surgeon: Kennedy Bucker, MD;  Location: [facility] ORS;  Service: Orthopedics;  Laterality: Right;  . TOTAL KNEE ARTHROPLASTY Left 05/26/2019   Procedure: TOTAL KNEE ARTHROPLASTY;  Surgeon: Kennedy BuckerMenz, Michael, MD;  Location: [facility] ORS;  Service: Orthopedics;  Laterality: Left;    Current Medications: Current Meds  Medication Sig  . acetaminophen (TYLENOL) 500 MG tablet Take 1,000 mg by mouth every 6 (six) hours as needed for moderate pain or headache.  . carvedilol (COREG) 12.5 MG tablet TAKE 1 TABLET(12.5 MG) BY MOUTH TWICE DAILY  . doxycycline (VIBRA-TABS) 100 MG tablet Take by mouth in the morning and at bedtime.  . furosemide (LASIX) 20 MG tablet  Take 1 tablet (20 mg total) by mouth daily as needed.  . meloxicam (MOBIC) 15 MG tablet Take 15 mg by mouth daily.  . Multiple Vitamins-Minerals (EQ MULTIVITAMINS ADULT GUMMY PO) Take 2 each by mouth daily.  . mupirocin ointment (BACTROBAN) 2 % daily.  . pantoprazole (PROTONIX) 40 MG tablet Take 40 mg by mouth at bedtime.     Allergies:   Aspirin   Social History   Socioeconomic History  . Marital status: Married    Spouse name: Not on file  . Number of children: Not on file  . Years of education: Not on file  . Highest education level: Not on file  Occupational History  . Not on file  Tobacco Use  . Smoking status: Never Smoker  . Smokeless tobacco: Never Used  Vaping Use  . Vaping Use: Never used  Substance and Sexual Activity  . Alcohol use: No  . Drug use: No  . Sexual activity: Not on file  Other Topics Concern  . Not on file  Social History Narrative  . Not on file   Social Determinants of Health   Financial Resource Strain:   . Difficulty of Paying Living Expenses: Not on file  Food Insecurity:   . Worried About Programme researcher, broadcasting/film/videounning Out of Food in the Last Year: Not on file  . Ran Out of Food in the Last Year: Not on file  Transportation Needs:   . Lack of Transportation (Medical): Not on file  . Lack of Transportation (Non-Medical): Not on file  Physical Activity:   . Days of Exercise per Week: Not on file  . Minutes of Exercise per Session: Not on file  Stress:   . Feeling of Stress : Not on file  Social Connections:   . Frequency of Communication with Friends and Family: Not on file  . Frequency of Social Gatherings with Friends and Family: Not on file  . Attends Religious Services: Not on file  . Active Member of Clubs or Organizations: Not on file  . Attends BankerClub or Organization Meetings: Not on file  . Marital Status: Not on file     Family History:  The patient's family history includes Hyperlipidemia in her mother; Hypertension in her father and  mother.  ROS:   Review of Systems  Constitutional: Negative for chills, diaphoresis, fever, malaise/fatigue and weight loss.  HENT: Negative for congestion.   Eyes: Negative for discharge and redness.  Respiratory: Positive for shortness of breath. Negative for cough, sputum production and wheezing.   Cardiovascular: Positive for leg swelling. Negative for chest pain, palpitations, orthopnea, claudication and PND.  Gastrointestinal: Negative for abdominal pain, heartburn, nausea and vomiting.  Musculoskeletal: Negative for falls and myalgias.  Skin: Negative for rash.  Neurological: Negative for dizziness, tingling, tremors, sensory change, speech change, focal weakness, loss of consciousness and weakness.  Endo/Heme/Allergies: Does not bruise/bleed easily.  Psychiatric/Behavioral: Negative for substance  abuse. The patient is not nervous/anxious.   All other systems reviewed and are negative.    EKGs/Labs/Other Studies Reviewed:    Studies reviewed were summarized above. The additional studies were reviewed today:  2D echo 11/2011: - Left ventricle: The cavity size was normal. There was mild  concentric hypertrophy. Systolic function was normal. The  estimated ejection fraction was in the range of 55% to  60%. Wall motion was normal; there were no regional wall  motion abnormalities. Left ventricular diastolic function  parameters were normal.  - Pulmonary arteries: Systolic pressure was mildly  increased, estimated to be 54mm Hg.    EKG:  EKG is ordered today.  The EKG ordered today demonstrates NSR, 78 bpm, left bundle branch block (known)  Recent Labs: 05/22/2019: ALT 22 05/27/2019: BUN 15; Creatinine, Ser 0.75; Potassium 4.0; Sodium 135 05/31/2019: Hemoglobin 8.3; Platelets 256  Recent Lipid Panel    Component Value Date/Time   CHOL 192 02/13/2019 1147   TRIG 185 (H) 02/13/2019 1147   HDL 58 02/13/2019 1147   CHOLHDL 3.3 02/13/2019 1147   LDLCALC 102 (H)  02/13/2019 1147    PHYSICAL EXAM:    VS:  BP 138/82 (BP Location: Right Arm, Patient Position: Sitting, Cuff Size: Large)   Pulse 78   Ht 5\' 3"  (1.6 m)   Wt (!) 384 lb (174.2 kg)   SpO2 98%   BMI 68.02 kg/m   BMI: Body mass index is 68.02 kg/m.  Physical Exam Constitutional:      Appearance: She is well-developed.  HENT:     Head: Normocephalic and atraumatic.  Eyes:     General:        Right eye: No discharge.        Left eye: No discharge.  Neck:     Vascular: No JVD.  Cardiovascular:     Rate and Rhythm: Normal rate and regular rhythm.     Pulses: No midsystolic click and no opening snap.          Posterior tibial pulses are 2+ on the right side and 2+ on the left side.     Heart sounds: Normal heart sounds, S1 normal and S2 normal. Heart sounds not distant. No murmur heard.  No friction rub.  Pulmonary:     Effort: Pulmonary effort is normal. No respiratory distress.     Breath sounds: Normal breath sounds. No decreased breath sounds, wheezing or rales.  Chest:     Chest wall: No tenderness.  Abdominal:     General: There is no distension.     Palpations: Abdomen is soft.     Tenderness: There is no abdominal tenderness.  Musculoskeletal:     Cervical back: Normal range of motion.     Right lower leg: Edema present.     Left lower leg: Edema present.  Skin:    General: Skin is warm and dry.     Nails: There is no clubbing.  Neurological:     Mental Status: She is alert and oriented to person, place, and time.  Psychiatric:        Speech: Speech normal.        Behavior: Behavior normal.        Thought Content: Thought content normal.        Judgment: Judgment normal.     Wt Readings from Last 3 Encounters:  01/08/20 (!) 384 lb (174.2 kg)  05/26/19 (!) 350 lb (158.8 kg)  02/13/19 (!) 372 lb (168.7 kg)  ASSESSMENT & PLAN:   1. Paroxysmal atrial tachycardia: Well controlled on carvedilol.  2. Lower extremity swelling dyspnea: Her lower extremity  swelling is likely multifactorial including recent surgery, dependent edema in the setting of morbid obesity, and venous insufficiency.  Cannot exclude some degree of diastolic dysfunction.  Recommend leg elevation.  Schedule echo.  Transition Lasix from as needed dosing to 20 mg daily.  Her Lasix will be taken at approximately 3 PM in an effort to assist with the dependent swelling that occurs in the afternoon hours while sitting at her desk at work.  Check BMP.  3. HTN: Blood pressure well controlled.  Continue carvedilol and Lasix as outlined above.  Low-sodium diet recommended.  4. Morbid obesity: Weight is stable when compared to her last in-person clinic visit.  Weight loss is recommended.  Encourage heart healthy diet and exercise program.  5. OSA: Continue CPAP.  6. LBBB: Known.  Prior echo with preserved LV SF.  Update echo as outlined above.  Disposition: F/u with Dr. Kirke Corin or an APP in 2 months.   Medication Adjustments/Labs and Tests Ordered: Current medicines are reviewed at length with the patient today.  Concerns regarding medicines are outlined above. Medication changes, Labs and Tests ordered today are summarized above and listed in the Patient Instructions accessible in Encounters.   Signed, Eula Listen, PA-C 01/08/2020 9:13 AM     [facility] [address] [place], [place] [address] [phone]

## 2020-01-08 ENCOUNTER — Other Ambulatory Visit: Payer: Self-pay

## 2020-01-08 ENCOUNTER — Encounter: Payer: Self-pay | Admitting: Physician Assistant

## 2020-01-08 ENCOUNTER — Ambulatory Visit (INDEPENDENT_AMBULATORY_CARE_PROVIDER_SITE_OTHER): Payer: Managed Care, Other (non HMO) | Admitting: Physician Assistant

## 2020-01-08 VITALS — BP 138/82 | HR 78 | Ht 63.0 in | Wt 384.0 lb

## 2020-01-08 DIAGNOSIS — I447 Left bundle-branch block, unspecified: Secondary | ICD-10-CM

## 2020-01-08 DIAGNOSIS — I10 Essential (primary) hypertension: Secondary | ICD-10-CM | POA: Diagnosis not present

## 2020-01-08 DIAGNOSIS — G4733 Obstructive sleep apnea (adult) (pediatric): Secondary | ICD-10-CM

## 2020-01-08 DIAGNOSIS — I471 Supraventricular tachycardia: Secondary | ICD-10-CM | POA: Diagnosis not present

## 2020-01-08 DIAGNOSIS — R0602 Shortness of breath: Secondary | ICD-10-CM

## 2020-01-08 DIAGNOSIS — M7989 Other specified soft tissue disorders: Secondary | ICD-10-CM

## 2020-01-08 DIAGNOSIS — I4719 Other supraventricular tachycardia: Secondary | ICD-10-CM

## 2020-01-08 DIAGNOSIS — Z9989 Dependence on other enabling machines and devices: Secondary | ICD-10-CM

## 2020-01-08 MED ORDER — FUROSEMIDE 20 MG PO TABS: ORAL_TABLET | ORAL | 3 refills | Status: DC

## 2020-01-08 MED ORDER — CARVEDILOL 12.5 MG PO TABS: ORAL_TABLET | ORAL | 3 refills | Status: DC

## 2020-01-08 NOTE — Patient Instructions (Signed)
Medication Instructions:  - Your physician has recommended you make the following change in your medication:   1) Increase lasix to 20 mg- take 1 tablet by mouth once daily   *If you need a refill on your cardiac medications before your next appointment, please call your pharmacy*   Lab Work: - Your physician recommends that you have lab work today: BMP  If you have labs (blood work) drawn today and your tests are completely normal, you will receive your results only by: Marland Kitchen MyChart Message (if you have MyChart) OR . A paper copy in the mail If you have any lab test that is abnormal or we need to change your treatment, we will call you to review the results.   Testing/Procedures: - Your physician has requested that you have an echocardiogram. Echocardiography is a painless test that uses sound waves to create images of your heart. It provides your doctor with information about the size and shape of your heart and how well your heart's chambers and valves are working. This procedure takes approximately one hour. There are no restrictions for this procedure. There is a possibility that an IV may need to be started during your test to inject an image enhancing agent. This is done to obtain more optimal pictures of your heart. Therefore we ask that you do at least drink some water prior to coming in to hydrate your veins.   Note: if you arrive more than 10 minutes after your echo appointment time, then your test may be rescheduled.     Follow-Up: At [facility], you and your health needs are our priority.  As part of our continuing mission to provide you with exceptional heart care, we have created designated Provider Care Teams.  These Care Teams include your primary Cardiologist (physician) and Advanced Practice Providers (APPs -  Physician Assistants and Nurse Practitioners) who all work together to provide you with the care you need, when you need it.  We recommend signing up for the  patient portal called "MyChart".  Sign up information is provided on this After Visit Summary.  MyChart is used to connect with patients for Virtual Visits (Telemedicine).  Patients are able to view lab/test results, encounter notes, upcoming appointments, etc.  Non-urgent messages can be sent to your provider as well.   To learn more about what you can do with MyChart, go to [email or web address].    Your next appointment:   2 month(s)  The format for your next appointment:   In Person  Provider:    You may see Lorine Bears, MD or one of the following Advanced Practice Providers on your designated Care Team:    Nicolasa Ducking, NP  Eula Listen, PA-C  Marisue Ivan, PA-C    Other Instructions

## 2020-01-09 LAB — BASIC METABOLIC PANEL
BUN/Creatinine Ratio: 19 (ref 9–23)
BUN: 15 mg/dL (ref 6–24)
CO2: 25 mmol/L (ref 20–29)
Calcium: 9.5 mg/dL (ref 8.7–10.2)
Chloride: 100 mmol/L (ref 96–106)
Creatinine, Ser: 0.78 mg/dL (ref 0.57–1.00)
GFR calc Af Amer: 102 mL/min/{1.73_m2} (ref >59)
GFR calc non Af Amer: 88 mL/min/{1.73_m2} (ref >59)
Glucose: 108 mg/dL — ABNORMAL HIGH (ref 65–99)
Potassium: 4.6 mmol/L (ref 3.5–5.2)
Sodium: 141 mmol/L (ref 134–144)

## 2020-01-12 NOTE — Addendum Note (Signed)
Addended by: Iverson Alamin C on: 01/12/2020 11:01 AM   Modules accepted: Orders

## 2020-01-29 ENCOUNTER — Other Ambulatory Visit: Payer: Managed Care, Other (non HMO)

## 2020-02-01 ENCOUNTER — Ambulatory Visit (INDEPENDENT_AMBULATORY_CARE_PROVIDER_SITE_OTHER): Payer: Managed Care, Other (non HMO)

## 2020-02-01 ENCOUNTER — Other Ambulatory Visit: Payer: Self-pay

## 2020-02-01 DIAGNOSIS — R0602 Shortness of breath: Secondary | ICD-10-CM | POA: Diagnosis not present

## 2020-02-01 LAB — ECHOCARDIOGRAM COMPLETE
AR max vel: 2.73 cm2
AV Area VTI: 2.69 cm2
AV Area mean vel: 2.89 cm2
AV Mean grad: 6 mmHg
AV Peak grad: 11.4 mmHg
Ao pk vel: 1.69 m/s
Area-P 1/2: 4.77 cm2
Calc EF: 61.5 %
S' Lateral: 3.6 cm
Single Plane A2C EF: 56.2 %
Single Plane A4C EF: 65.9 %

## 2020-02-01 MED ORDER — PERFLUTREN LIPID MICROSPHERE
1.0000 mL | INTRAVENOUS | Status: AC | PRN
  Administered 2020-02-01: 2 mL via INTRAVENOUS

## 2020-02-08 ENCOUNTER — Other Ambulatory Visit: Payer: Self-pay

## 2020-02-08 MED ORDER — CARVEDILOL 12.5 MG PO TABS: ORAL_TABLET | ORAL | 0 refills | Status: DC

## 2020-02-08 MED ORDER — FUROSEMIDE 20 MG PO TABS: ORAL_TABLET | ORAL | 0 refills | Status: DC

## 2020-02-24 IMAGING — CT CT KNEE*L* W/O CM
3 of 8 series · 8 of 33 positions shown, 9 images · non-contrast
Comparison: None.

CLINICAL DATA: Preop for total left knee arthroplasty. My knee
protocol.

EXAM:
CT OF THE left KNEE WITHOUT CONTRAST
TECHNIQUE: Multidetector CT imaging of the left knee was performed according to
the standard protocol. Multiplanar CT image reconstructions were
also generated.

[Series 4: axial bone knee · axial · 0.39mm/px · z∈[+1100,+1288]mm · 2 of 188 slices shown, 3 images]
[im 47/188  soft-tissue]
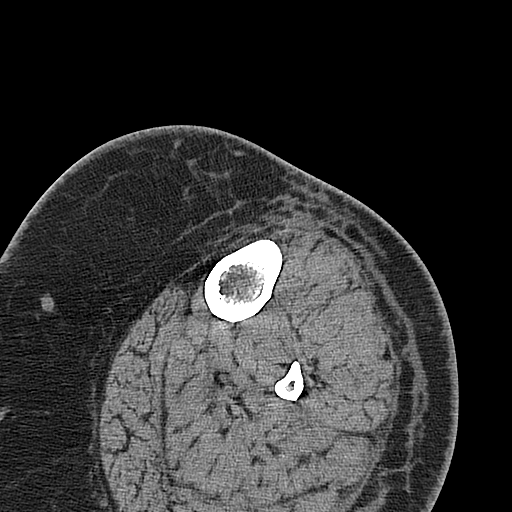
[im 47/188  bone]
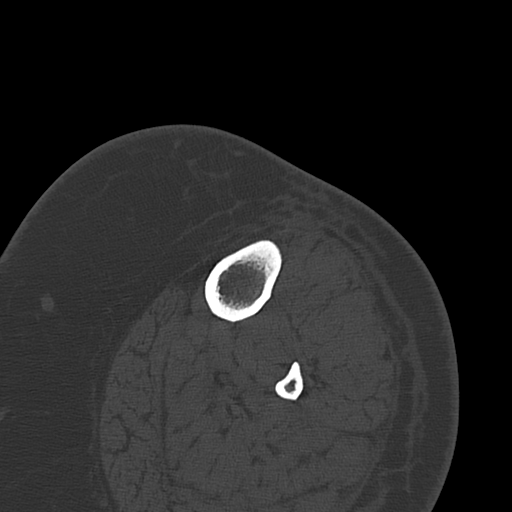
[im 141/188  bone]
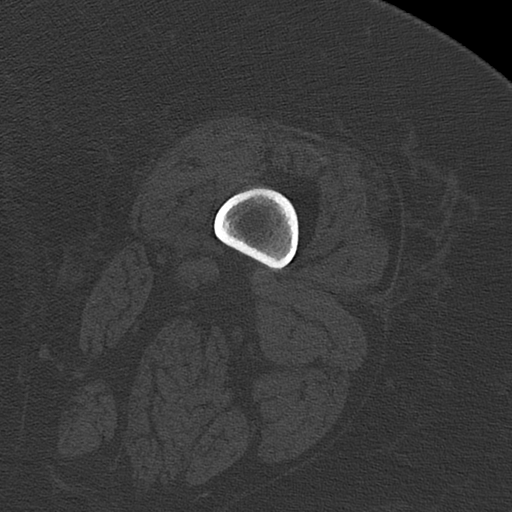

[Series 6: coronal bone knee · coronal · 0.26mm/px · 1 of 68 slices shown]
[im 34/68  bone]
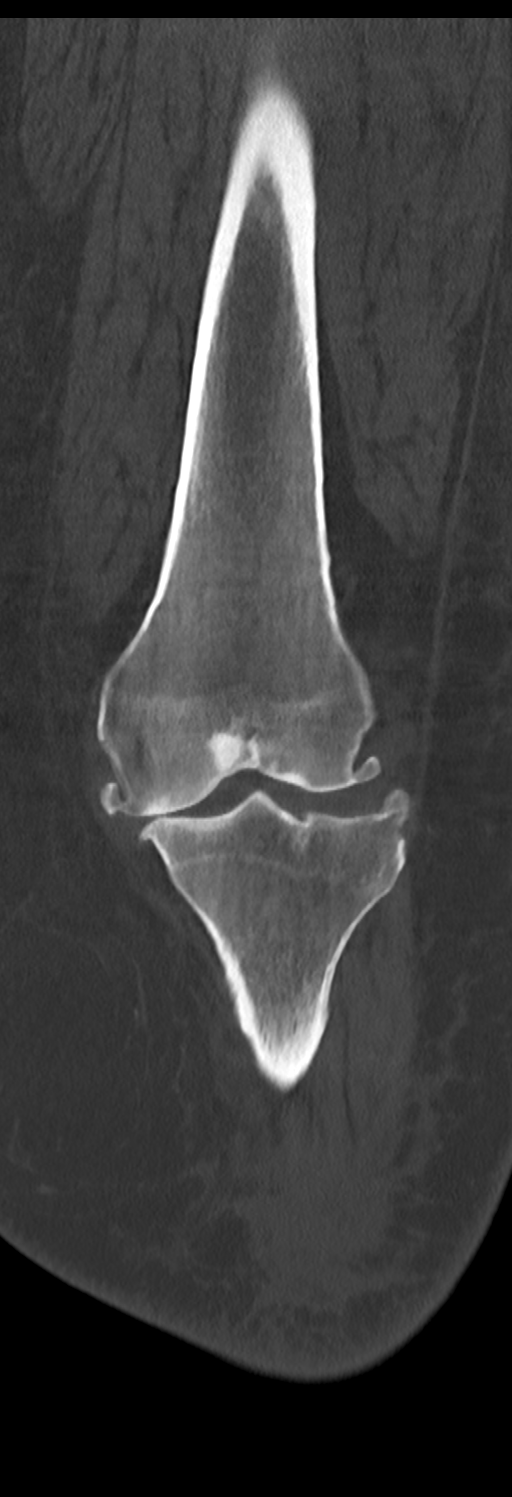

[Series 10: sagittal bone knee · sagittal · 0.27mm/px · 5 of 65 slices shown]
[im 11/65  bone]
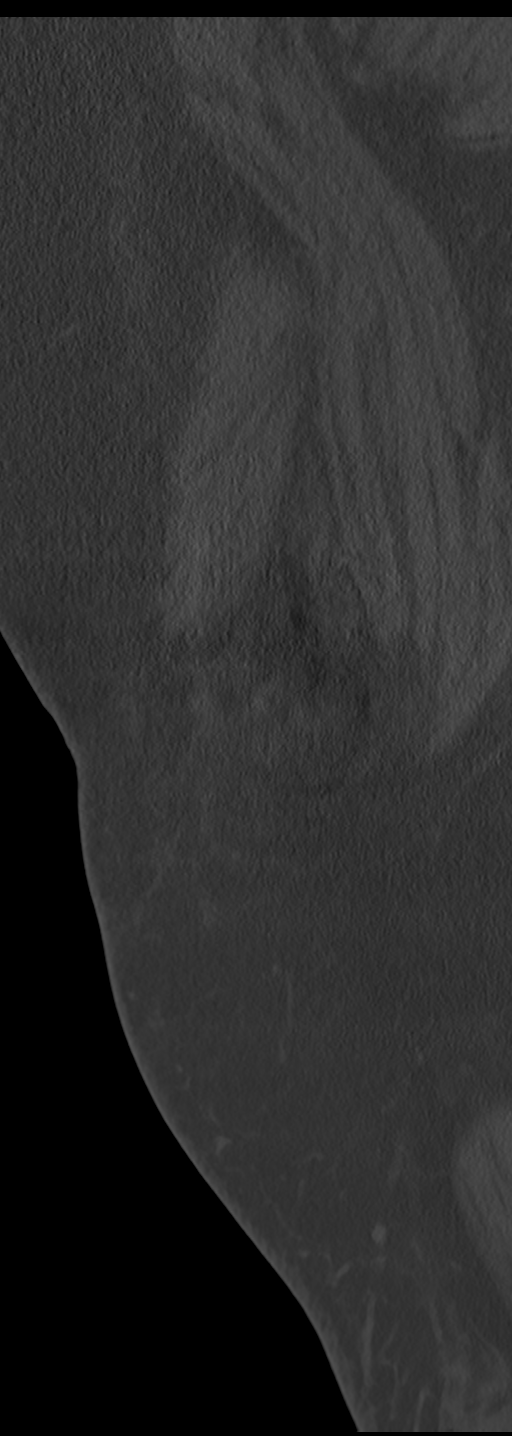
[im 22/65  bone]
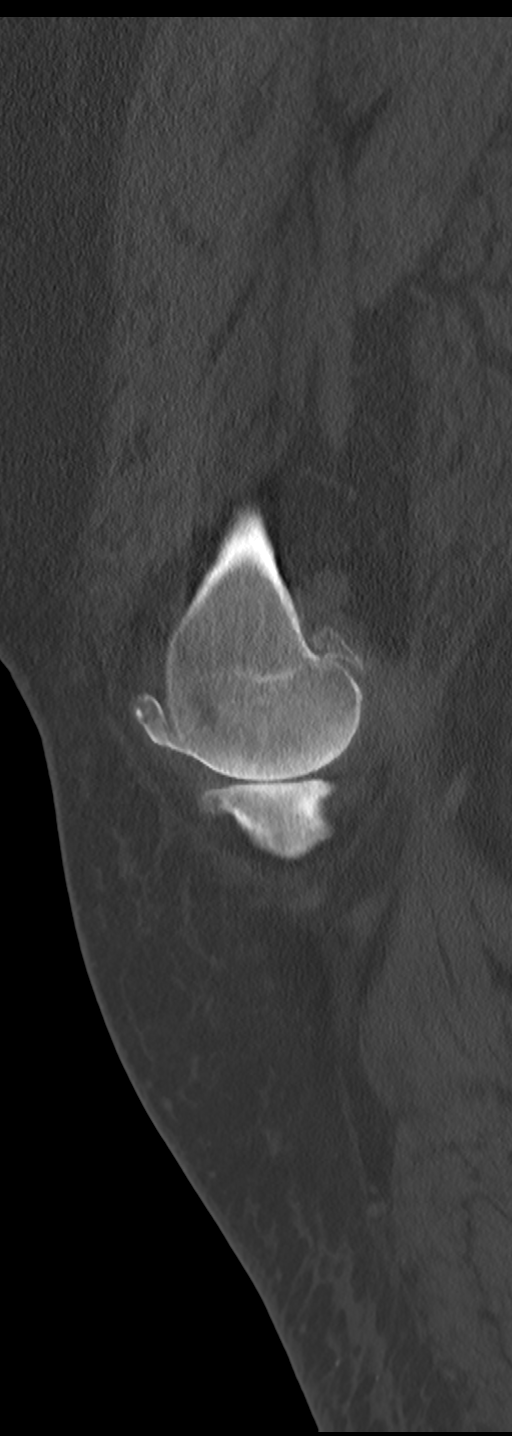
[im 33/65  bone]
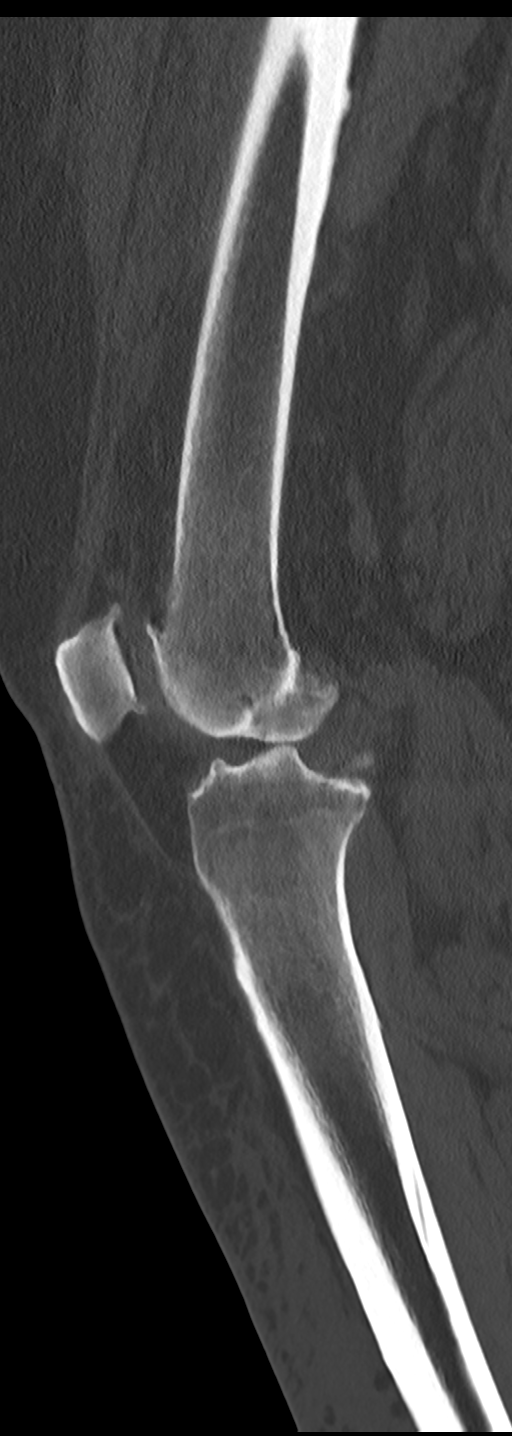
[im 43/65  bone]
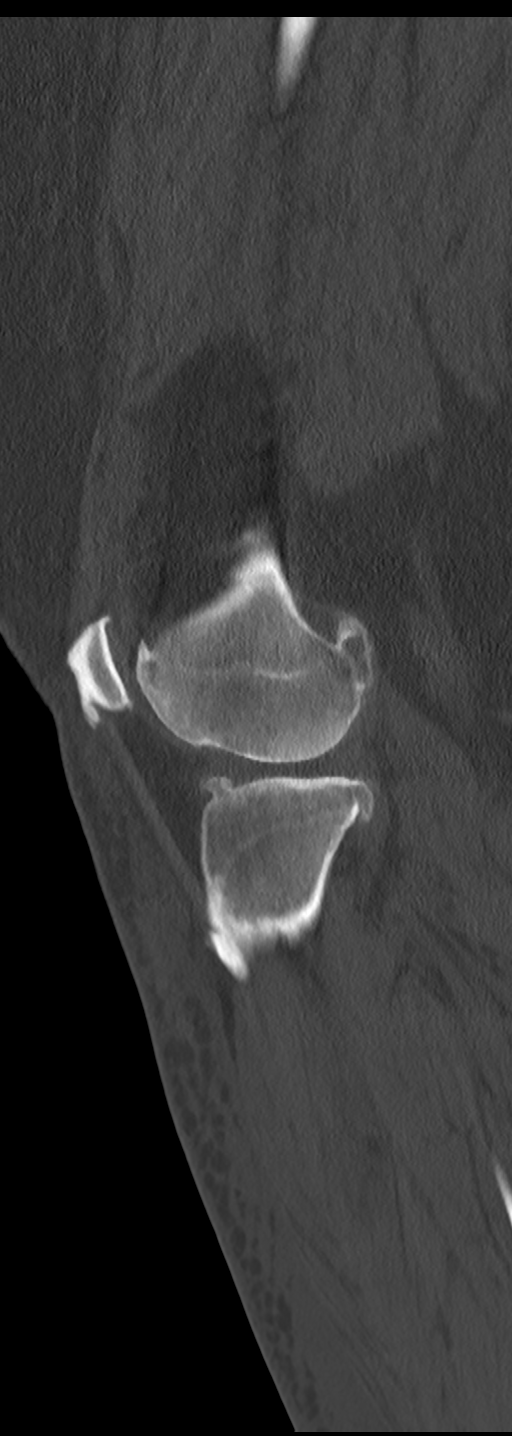
[im 54/65  bone]
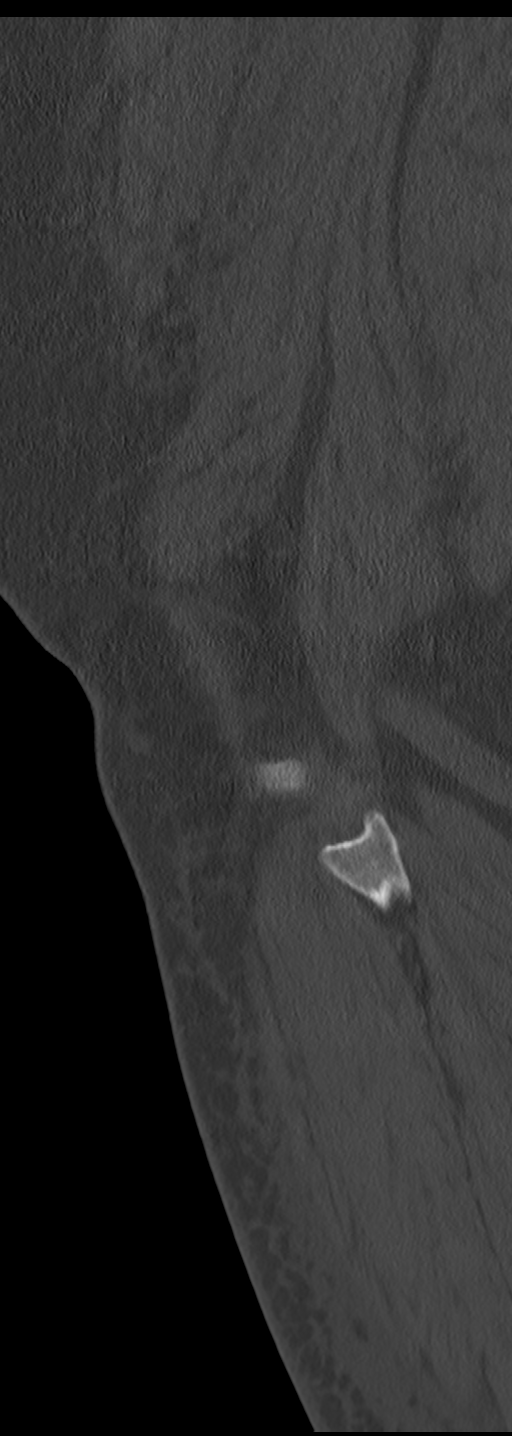

[8 of 33 positions shown; findings below may reference images not displayed]

FINDINGS: Left hip:

Axial images demonstrate advanced degenerative disease with joint
space narrowing, osteophytic spurring, bony eburnation and
subchondral cystic change. No fracture or bone lesion.

Left knee:

Advanced tricompartmental degenerative changes with joint space
narrowing, osteophytic spurring, bony eburnation and subchondral
cystic change. No worrisome bone lesions or fracture. Small joint
effusion.

Left ankle:

Axial images demonstrate moderate degenerative changes and probable
remote small avulsion fractures. No acute bony findings.
IMPRESSION: 1. Advanced tricompartmental degenerative changes involving the left
knee. No acute bony findings or worrisome bone lesions.
2. Advanced left hip joint degenerative changes and moderate left
ankle joint degenerative changes.

## 2020-03-03 IMAGING — DX DG HIP (WITH OR WITHOUT PELVIS) 2-3V*L*
2 series · 3 of 3 positions shown · non-contrast
Comparison: None.

CLINICAL DATA: Status post left hip replacement.

EXAM:
DG HIP (WITH OR WITHOUT PELVIS) 2-3V LEFT

[hip ap]
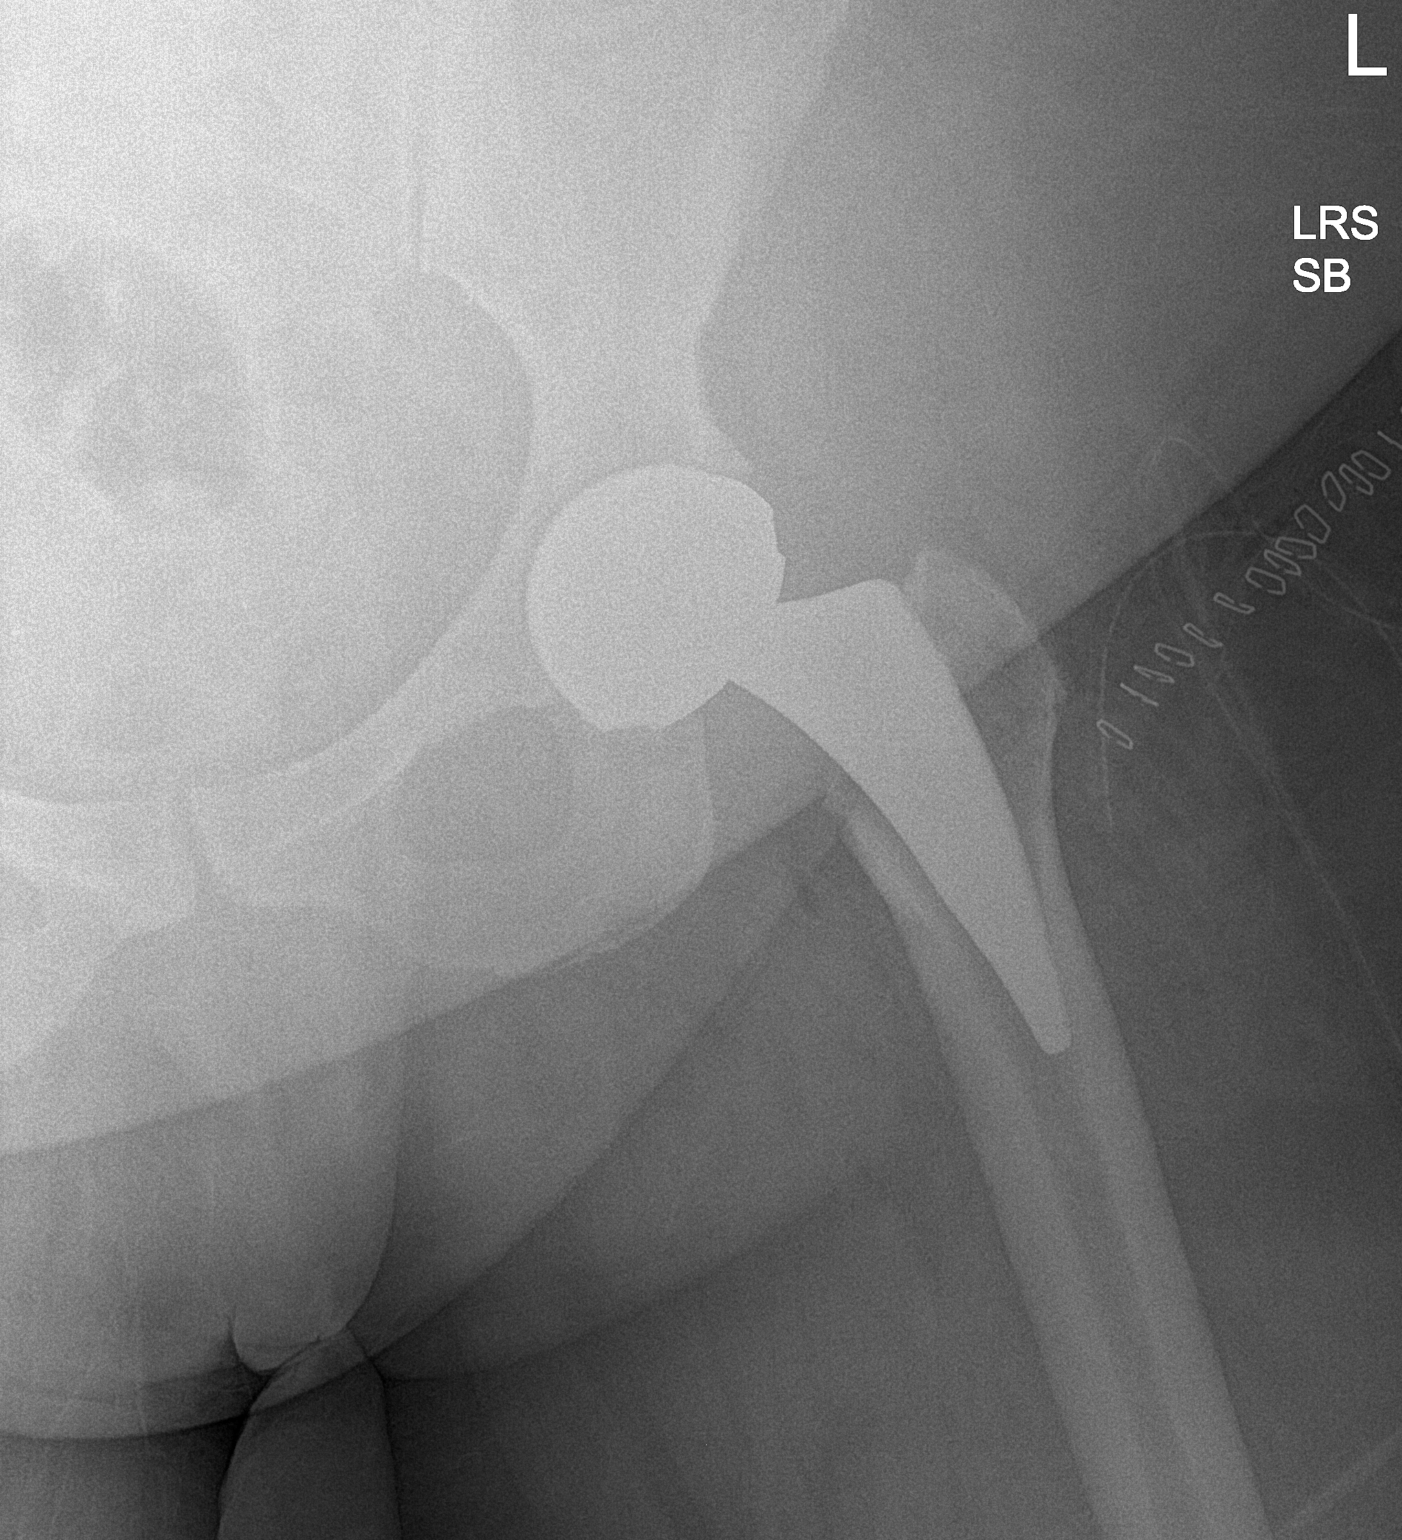

[Series 3: hip lat · 0.14mm/px · 2 of 2 slices shown]
[im 1/2]
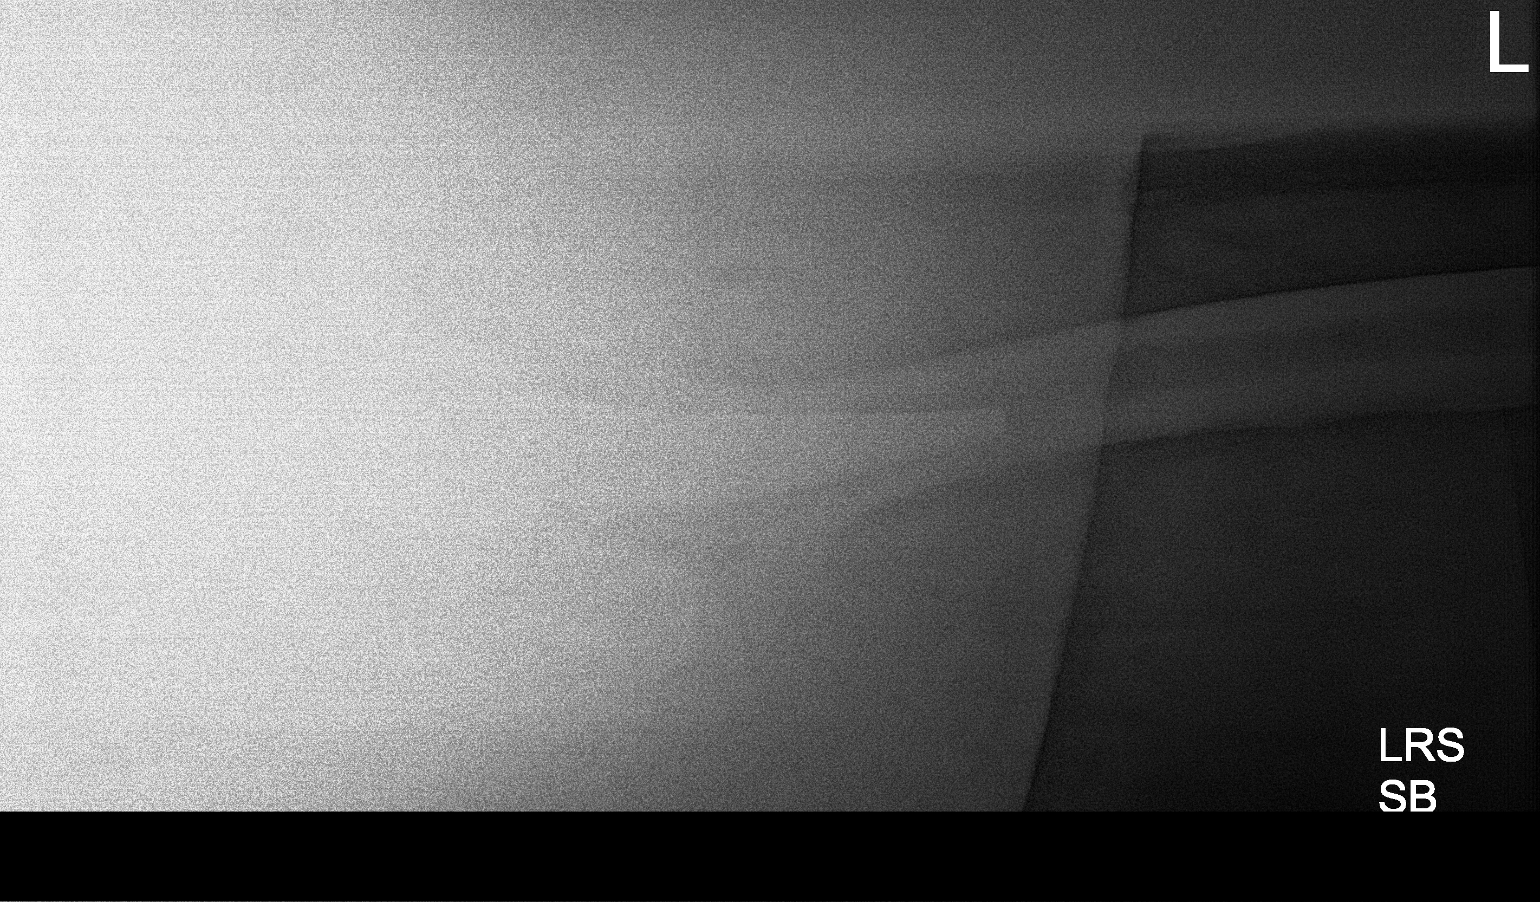
[im 2/2]
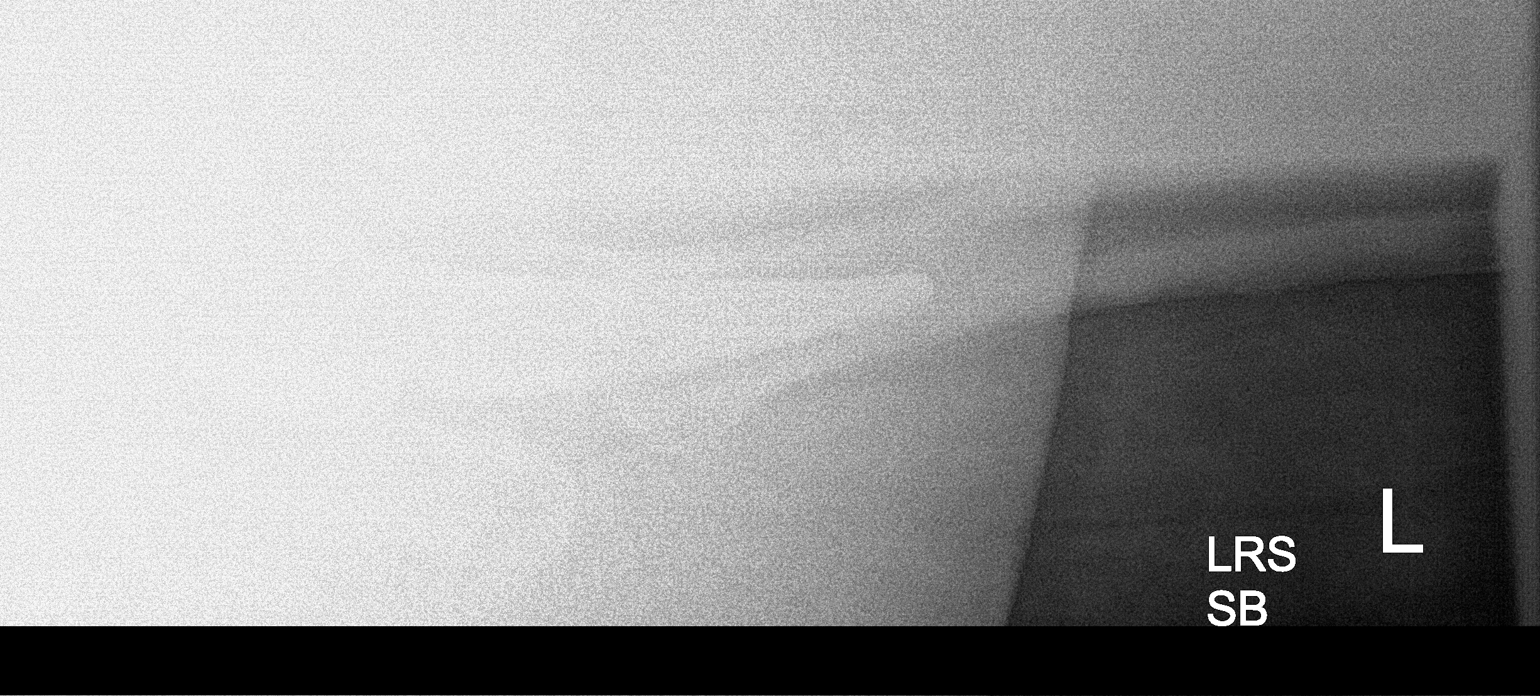

[3 of 3 positions shown; findings below may reference images not displayed]

FINDINGS: Left hip prosthesis in normal position and alignment in the frontal
projection. This cannot be viewed in its entirety in the lateral
projection due to the thickness of the overlying soft tissues. The
visualized portions are unremarkable. No visible fracture or
dislocation.
IMPRESSION: Satisfactory postoperative appearance of a left hip prosthesis with
limited visualization in the lateral projection due to body habitus.

## 2020-03-03 IMAGING — XA DG HIP (WITH PELVIS) OPERATIVE*L*
2 series · 2 of 2 positions shown · non-contrast
Comparison: None.

CLINICAL DATA: Left anterior hip placed

EXAM:
OPERATIVE LEFT HIP (WITH PELVIS IF PERFORMED) 2 VIEWS
TECHNIQUE: Fluoroscopic spot image(s) were submitted for interpretation
post-operatively.

[Series 2: ortho standard · 1 of 1 slices shown (1 of 2)]
[im 1/1]
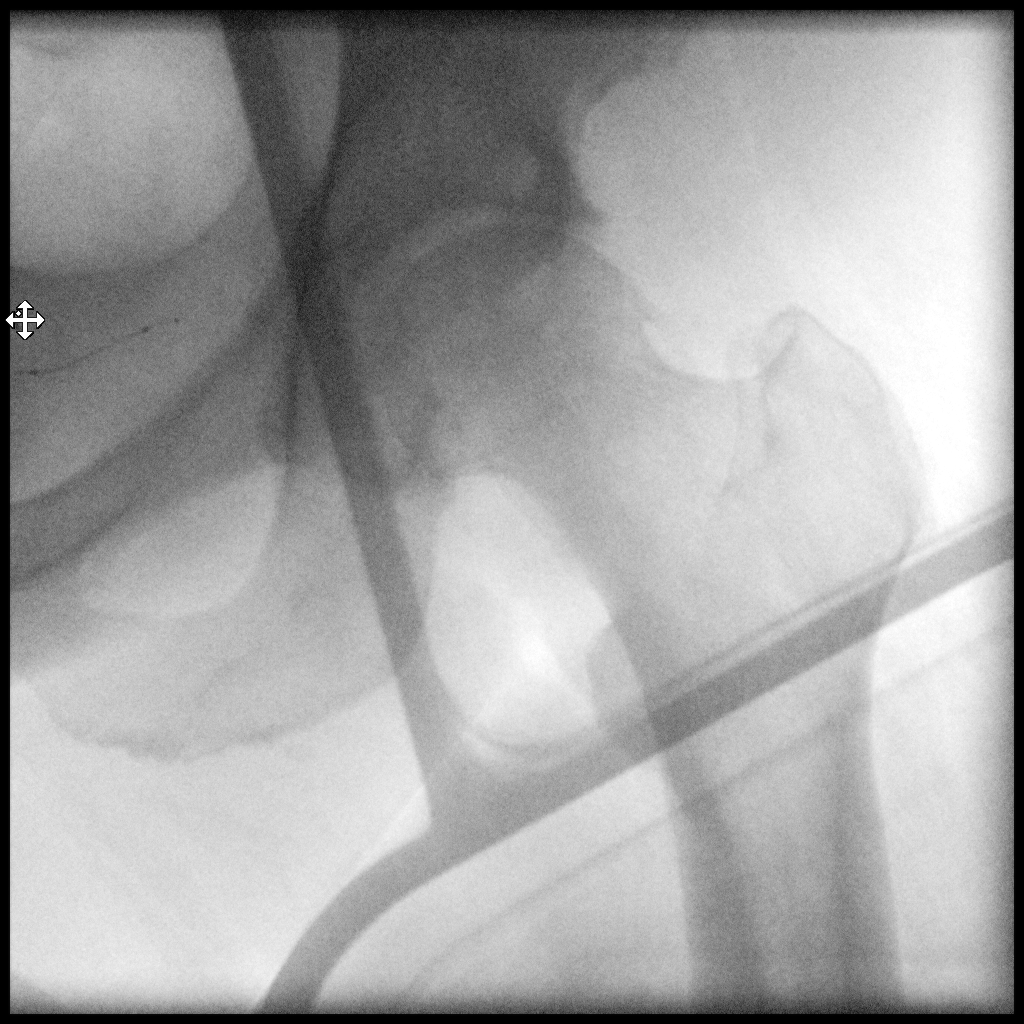

[Series 6: ortho standard · 1 of 1 slices shown (2 of 2)]
[im 1/1]
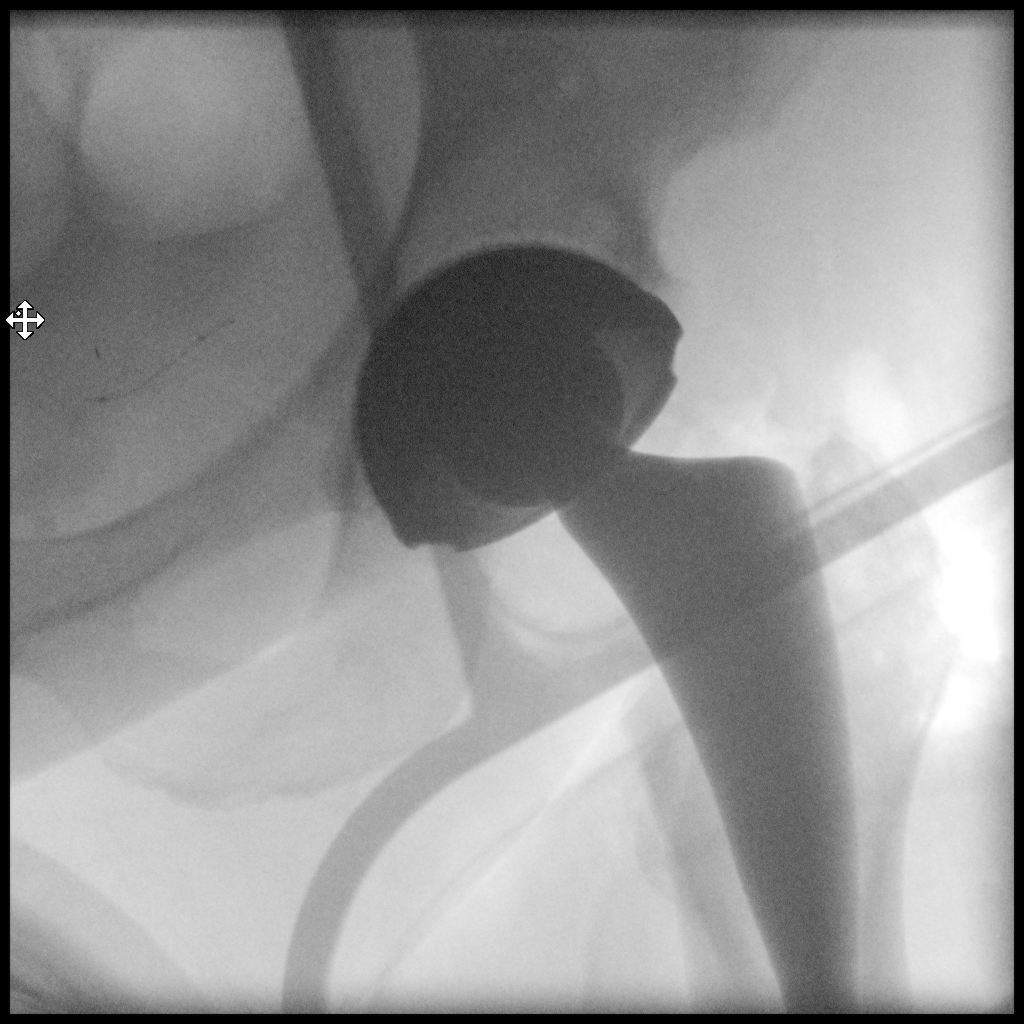

[2 of 2 positions shown; findings below may reference images not displayed]

FINDINGS: Interval left total hip arthroplasty without failure or
complication. No dislocation.
IMPRESSION: Interval left total hip arthroplasty.

## 2020-03-09 NOTE — Progress Notes (Deleted)
Cardiology Office Note    Date:  03/09/2020   ID:  [name], [name] [birth year], MRN [id number]  PCP:  Olive Bass, MD  Cardiologist:  Lorine Bears, MD  Electrophysiologist:  None   Chief Complaint: Follow up  History of Present Illness:   [name] [name] is a 49 y.o. female with history of atrial tachycardia, LBBB, HTN, obesity, OSA on CPAP, and GERD who presents for follow up of ***.  She was diagnosed with atrial tachycardia in 2009 and underwent successful ablation by Dr. Chales Abrahams in [facility]. Following this, she developed a LBBB. Echo in 2013 showed an EF of 55-60%, no RWMA, normal LV diastolic function parameters, PASP 38 mmHg. She was seen virtually in 12/2018, and was doing well from a cardiac perspective.  Following this, she underwent a left total hip arthroplasty in 01/2019 and a left total knee arthroplasty (prior right knee TKA in 11/2018).    With this, she did note some worsening lower extremity swelling and was taking Lasix 20 mg as needed.  She was most recently seen in the office on 01/08/2020 and was doing reasonably well.  She continued to note waxing and waning lower extremity swelling as well as fluctuations in weight and dyspnea.  She was typically taking Lasix 3-4 times per week.  Her lower extremity swelling was most noticeable after sitting down at her desk at work.  She was watching her sodium intake though was eating out at restaurants 4-5 times per week.  Her Lasix was transitioned from as needed dosing to 20 mg daily.  Echo on 02/01/2020 showed an EF of 55 to 60%, no regional wall motion abnormalities, indeterminate diastolic function parameters, normal RV systolic function and ventricular cavity size with mildly elevated PASP estimated at 45 mmHg, mildly dilated left atrium, trivial mitral regurgitation, and an estimated right atrial pressure of 8 mmHg.  ***   Labs independently reviewed: 12/2019 - BUN 15, serum creatinine 0.78, potassium 4.6 09/2019 - A1c  5.3 08/2019 - direct LDL 112, TC 185, TG 178, HDL 54 05/2019 - HGB 10.9, PLT 470 04/2019 - albumin 3.9, AST/ALT normal  Past Medical History:  Diagnosis Date  . Appendicitis 08/16/2016  . Arthritis   . Atrial tachycardia, paroxysmal (HCC)    a. s/p ablation in 2009 by Dr. Chales Abrahams in [facility] (falied); b. Managed w/ beta blocker.  Marland Kitchen GERD (gastroesophageal reflux disease)   . Hypertension   . LBBB (left bundle branch block)    atrial tach.  . Obesity   . Sleep apnea    cpap    Past Surgical History:  Procedure Laterality Date  . ABLATION OF DYSRHYTHMIC FOCUS  2009  . ANTERIOR CRUCIATE LIGAMENT REPAIR    . APPLICATION OF WOUND VAC Right 11/06/2018   Procedure: APPLICATION OF WOUND VAC;  Surgeon: Kennedy Bucker, MD;  Location: [facility] ORS;  Service: Orthopedics;  Laterality: Right;  [id number]  . LAPAROSCOPIC APPENDECTOMY N/A 08/16/2016   Procedure: APPENDECTOMY LAPAROSCOPIC;  Surgeon: Lattie Haw, MD;  Location: [facility] ORS;  Service: General;  Laterality: N/A;  . TONSILLECTOMY    . TOTAL HIP ARTHROPLASTY Left 01/29/2019   Procedure: TOTAL HIP ARTHROPLASTY ANTERIOR APPROACH;  Surgeon: Kennedy Bucker, MD;  Location: [facility] ORS;  Service: Orthopedics;  Laterality: Left;  . TOTAL KNEE ARTHROPLASTY Right 11/06/2018   Procedure: RIGHT TOTAL KNEE ARTHROPLASTY;  Surgeon: Kennedy Bucker, MD;  Location: [facility] ORS;  Service: Orthopedics;  Laterality: Right;  . TOTAL KNEE ARTHROPLASTY Left 05/26/2019  Procedure: TOTAL KNEE ARTHROPLASTY;  Surgeon: Kennedy Bucker, MD;  Location: [facility] ORS;  Service: Orthopedics;  Laterality: Left;    Current Medications: No outpatient medications have been marked as taking for the 03/11/20 encounter (Appointment) with Sondra Barges, PA-C.    Allergies:   Aspirin   Social History   Socioeconomic History  . Marital status: Married    Spouse name: Not on file  . Number of children: Not on file  . Years of education: Not on file  . Highest education level: Not on file   Occupational History  . Not on file  Tobacco Use  . Smoking status: Never Smoker  . Smokeless tobacco: Never Used  Vaping Use  . Vaping Use: Never used  Substance and Sexual Activity  . Alcohol use: No  . Drug use: No  . Sexual activity: Not on file  Other Topics Concern  . Not on file  Social History Narrative  . Not on file   Social Determinants of Health   Financial Resource Strain:   . Difficulty of Paying Living Expenses: Not on file  Food Insecurity:   . Worried About Programme researcher, broadcasting/film/video in the Last Year: Not on file  . Ran Out of Food in the Last Year: Not on file  Transportation Needs:   . Lack of Transportation (Medical): Not on file  . Lack of Transportation (Non-Medical): Not on file  Physical Activity:   . Days of Exercise per Week: Not on file  . Minutes of Exercise per Session: Not on file  Stress:   . Feeling of Stress : Not on file  Social Connections:   . Frequency of Communication with Friends and Family: Not on file  . Frequency of Social Gatherings with Friends and Family: Not on file  . Attends Religious Services: Not on file  . Active Member of Clubs or Organizations: Not on file  . Attends Banker Meetings: Not on file  . Marital Status: Not on file     Family History:  The patient's family history includes Hyperlipidemia in her mother; Hypertension in her father and mother.  ROS:   ROS   EKGs/Labs/Other Studies Reviewed:    Studies reviewed were summarized above. The additional studies were reviewed today:  2D echo 11/2011: 2D echo 11/2011: - Left ventricle: The cavity size was normal. There was mild  concentric hypertrophy. Systolic function was normal. The  estimated ejection fraction was in the range of 55% to  60%. Wall motion was normal; there were no regional wall  motion abnormalities. Left ventricular diastolic function  parameters were normal.  - Pulmonary arteries: Systolic pressure was mildly   increased, estimated to be 11mm Hg.  __________  2D echo 01/2020: 1. Left ventricular ejection fraction, by estimation, is 55 to 60%. The  left ventricle has normal function. The left ventricle has no regional  wall motion abnormalities. Left ventricular diastolic parameters are  indeterminate.  2. Right ventricular systolic function is normal. The right ventricular  size is normal. There is mildly elevated pulmonary artery systolic  pressure. The estimated right ventricular systolic pressure is 45.0 mmHg.  3. Left atrial size was mildly dilated.  4. The mitral valve is normal in structure. Trivial mitral valve  regurgitation. No evidence of mitral stenosis.  5. The aortic valve is normal in structure. Aortic valve regurgitation is  not visualized. No aortic stenosis is present.  6. The inferior vena cava is dilated in size with >50% respiratory  variability, suggesting right atrial pressure of 8 mmHg.   Comparison(s): EF 55-60%.   EKG:  EKG is ordered today.  The EKG ordered today demonstrates ***  Recent Labs: 05/22/2019: ALT 22 05/31/2019: Hemoglobin 8.3; Platelets 256 01/08/2020: BUN 15; Creatinine, Ser 0.78; Potassium 4.6; Sodium 141  Recent Lipid Panel    Component Value Date/Time   CHOL 192 02/13/2019 1147   TRIG 185 (H) 02/13/2019 1147   HDL 58 02/13/2019 1147   CHOLHDL 3.3 02/13/2019 1147   LDLCALC 102 (H) 02/13/2019 1147    PHYSICAL EXAM:    VS:  There were no vitals taken for this visit.  BMI: There is no height or weight on file to calculate BMI.  Physical Exam  Wt Readings from Last 3 Encounters:  01/08/20 (!) 384 lb (174.2 kg)  05/26/19 (!) 350 lb (158.8 kg)  02/13/19 (!) 372 lb (168.7 kg)     ASSESSMENT & PLAN:   1. Paroxysmal atrial tachycardia:  2. Lower extremity swelling/dyspnea:  3. HTN: Blood pressure ***  4. Morbid obesity:  5. OSA:   6. LBBB:  Disposition: F/u with Dr. Kirke Corin or an APP in ***.   Medication Adjustments/Labs  and Tests Ordered: Current medicines are reviewed at length with the patient today.  Concerns regarding medicines are outlined above. Medication changes, Labs and Tests ordered today are summarized above and listed in the Patient Instructions accessible in Encounters.   Signed, Eula Listen, PA-C 03/09/2020 8:14 AM     [facility] [place] [address] [place], [place] [address] [phone]

## 2020-03-11 ENCOUNTER — Ambulatory Visit: Payer: Managed Care, Other (non HMO) | Admitting: Physician Assistant

## 2020-03-24 ENCOUNTER — Ambulatory Visit: Payer: Managed Care, Other (non HMO) | Admitting: Physician Assistant

## 2020-03-24 ENCOUNTER — Other Ambulatory Visit: Payer: Self-pay

## 2020-03-24 DIAGNOSIS — J302 Other seasonal allergic rhinitis: Secondary | ICD-10-CM

## 2020-03-24 DIAGNOSIS — J00 Acute nasopharyngitis [common cold]: Secondary | ICD-10-CM

## 2020-03-24 MED ORDER — FLUTICASONE PROPIONATE 50 MCG/ACT NA SUSP: 2.0000 | Freq: Every day | NASAL | 6 refills | Status: DC

## 2020-03-24 NOTE — Progress Notes (Signed)
   Subjective:    Patient ID: [name] [name], female    DOB: [birth year], 49 y.o.   MRN: [id number]  HPI [name] [name] is a 49 yo F who calls in with upper respiratory concerns. We are unable to bring respiratory symptoms into this suite at this time and she was offered virtual visit or a telephone visit as preferred, and she requested telephone.  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  She is at work at time of call.  Lacinda has had her Covid vaccine. Her husband has not. She did a Quick Test today at her workplace - reported negative  She has a mild scratchy throat, no cough,  Has clear nasal discharge and has noted mild streaks of blood at times when blowing recently.  She denies fever but has not checked temperature. Denies fatigue or malaise, but does not feel "100%"  She has known seasonal/environmental allergies and uses Flonase ( fluticasone) nasal spray and  Zyrtec (ceterizine) 10 mg daily. Has recently returned to using it and has only missed an occasional dose. Answers that she does have a rust or roses response to the nasal Rx when she uses it. Has post nasal drip   No facial pressure or pain.  No headache. No NVD  No excessive sleepiness, recent weight on record 384  Uses CPAP routinely,  Review of Systems As noted most Non- smoker    Objective:   Physical Exam   Telephone visit only.  Not examined Denies fever but has not taken temperature Symptoms as described- c/w common cold or viral infection Suspect seasonal allergies and reaction to low humidity and significant recent  temperature variation.  Takes meloxicam - so uses only tylenol ad lib    Assessment & Plan:  Common cold Seasonal allergy   Fluticasone Rx : Sent to pharmacy at patient request- reviewed use technique- has end of year  allowance pending  Reviewed low humidity and fragile nasal mucosa- may use petrolatum on Q-tip to nasal mucosa prn.   Hydrate and encourage warm showers and steam- gently blow not to irritate mucosa. Not interested in [facility] use  Encourage careful handwashing, mask usage and hand sanitizers. Protect yourself from others germs especially while experiencing viral symptoms  Report back after the weekend ,if symptoms continue or exacerbate recommend  PCP evaluation. Urgent Care can be utilized as desired if needed over weekend  Question fielded ; recommendations reviewed and patient expresses understanding.

## 2020-04-18 NOTE — Progress Notes (Signed)
Cardiology Office Note    Date:  04/26/2020   ID:  [name] [name], [name] [name] March 23, 1971, MRN [id number]  PCP:  Olive Bass, MD  Cardiologist:  Lorine Bears, MD  Electrophysiologist:  None   Chief Complaint: Follow up  History of Present Illness:   [name] [name] is a 49 y.o. female with history of atrial tachycardia, LBBB, HTN, obesity, OSA on CPAP, and GERD who presents for follow up of  lower extremity swelling and dyspnea.  She was diagnosed with atrial tachycardia in 2009 and underwent successful ablation by Dr. Chales Abrahams in [facility]. Following this, she developed a LBBB. Echo in 2013 showed an EF of 55-60%, no RWMA, normal LV diastolic function parameters, PASP 38 mmHg. She was seen virtually in 12/2018, and was doing well from a cardiac perspective. She underwent a left total hip arthroplasty in 01/2019 and a left total knee arthroplasty (prior right knee TKA in 11/2018).  Following this, she requested a refill on Lasix 20 mg as needed secondary to lower extremity swelling with return to work.  She was last seen in the office on 01/08/2020 and was doing reasonably well.  She did continue to note intermittent lower extremity swelling as well as fluctuations in weight and dyspnea.  She was taking Lasix 3-4 times per week.  Her lower extremity swelling was more pronounced after sitting at her desk for prolonged timeframes.  Her Lasix was changed from as needed dosing to 20 mg daily.  Echo on 02/01/2020 showed an EF of 55 to 60%, no regional wall motion abnormalities, indeterminate diastolic function parameters, normal RV systolic function and ventricular cavity size, mildly elevated PASP estimated at 45 mmHg, mildly dilated left atrium, trivial mitral regurgitation, and an estimated right atrial pressure of 8 mmHg.  She comes in doing well from a cardiac perspective.  Since she was last seen, after taking Lasix 20 mg on a daily basis she has noted improvement in her dyspnea and lower extremity  swelling.  She is also more active now as she continues to distance herself from her recent knee surgery.  She does continue to be limited by some knee, hip, and back discomfort.  She does try and watch her p.o. fluid and salt intake though over the holidays did indulge in some added salty foods.  Her weight is up 19 pounds today when compared to her last clinic visit though this is attributed to the recent holiday.  She denies any abdominal distention, orthopnea, PND, early satiety.  She does typically wear compression stockings.  She did started OTC potassium supplement with daily Lasix.  She requests a refill on her Protonix.   Labs independently reviewed: 12/2019 - BUN 15, serum creatinine 0.78, potassium 4.6 09/2019 - A1c 5.3 08/2019 - direct LDL 112, TC 185, TG 178, HDL 54 05/2019 - HGB 10.9, PLT 470 04/2019 - albumin 3.9, AST/ALT normal  Past Medical History:  Diagnosis Date  . Appendicitis 08/16/2016  . Arthritis   . Atrial tachycardia, paroxysmal (HCC)    a. s/p ablation in 2009 by Dr. Chales Abrahams in [facility] ([facility]); b. Managed w/ beta blocker.  Marland Kitchen GERD (gastroesophageal reflux disease)   . Hypertension   . LBBB (left bundle branch block)    atrial tach.  . Obesity   . Sleep apnea    cpap    Past Surgical History:  Procedure Laterality Date  . ABLATION OF DYSRHYTHMIC FOCUS  2009  . ANTERIOR CRUCIATE LIGAMENT REPAIR    . APPLICATION  OF WOUND VAC Right 11/06/2018   Procedure: APPLICATION OF WOUND VAC;  Surgeon: Kennedy Bucker, MD;  Location: [facility] ORS;  Service: Orthopedics;  Laterality: Right;  [id number]  . LAPAROSCOPIC APPENDECTOMY N/A 08/16/2016   Procedure: APPENDECTOMY LAPAROSCOPIC;  Surgeon: Lattie Haw, MD;  Location: [facility] ORS;  Service: General;  Laterality: N/A;  . TONSILLECTOMY    . TOTAL HIP ARTHROPLASTY Left 01/29/2019   Procedure: TOTAL HIP ARTHROPLASTY ANTERIOR APPROACH;  Surgeon: Kennedy Bucker, MD;  Location: [facility] OR[facility];  Service: Orthopedics;  Laterality: Left;  .  TOTAL KNEE ARTHROPLASTY Right 11/06/2018   Procedure: RIGHT TOTAL KNEE ARTHROPLASTY;  Surgeon: Kennedy Bucker, MD;  Location: [facility] ORS;  Service: Orthopedics;  Laterality: Right;  . TOTAL KNEE ARTHROPLASTY Left 05/26/2019   Procedure: TOTAL KNEE ARTHROPLASTY;  Surgeon: Kennedy Bucker, MD;  Location: [facility];  Service: Orthopedics;  Laterality: Left;    Current Medications: Current Meds  Medication Sig  . acetaminophen (TYLENOL) 500 MG tablet Take 1,000 mg by mouth every 6 (six) hours as needed for moderate pain or headache.  . carvedilol (COREG) 12.5 MG tablet TAKE 1 TABLET(12.5 MG) BY MOUTH TWICE DAILY  . furosemide (LASIX) 20 MG tablet Take 1 tablet (20 mg) by mouth once daily  . meloxicam (MOBIC) 15 MG tablet Take 15 mg by mouth daily.  . Multiple Vitamins-Minerals (EQ MULTIVITAMINS ADULT GUMMY PO) Take 2 each by mouth daily.  . [DISCONTINUED] pantoprazole (PROTONIX) 40 MG tablet Take 40 mg by mouth at bedtime.     Allergies:   Aspirin   Social History   Socioeconomic History  . Marital status: Married    Spouse name: Not on file  . Number of children: Not on file  . Years of education: Not on file  . Highest education level: Not on file  Occupational History  . Not on file  Tobacco Use  . Smoking status: Never Smoker  . Smokeless tobacco: Never Used  Vaping Use  . Vaping Use: Never used  Substance and Sexual Activity  . Alcohol use: No  . Drug use: No  . Sexual activity: Not on file  Other Topics Concern  . Not on file  Social History Narrative  . Not on file   Social Determinants of Health   Financial Resource Strain: Not on file  Food Insecurity: Not on file  Transportation Needs: Not on file  Physical Activity: Not on file  Stress: Not on file  Social Connections: Not on file     Family History:  The patient's family history includes Hyperlipidemia in her mother; Hypertension in her father and mother.  ROS:   Review of Systems  Constitutional: Negative for  chills, diaphoresis, fever, malaise/fatigue and weight loss.  HENT: Negative for congestion.   Eyes: Negative for discharge and redness.  Respiratory: Negative for cough, sputum production, shortness of breath and wheezing.   Cardiovascular: Negative for chest pain, palpitations, orthopnea, claudication, leg swelling and PND.  Gastrointestinal: Negative for abdominal pain, heartburn, nausea and vomiting.  Musculoskeletal: Positive for back pain and joint pain. Negative for falls and myalgias.  Skin: Negative for rash.  Neurological: Negative for dizziness, tingling, tremors, sensory change, speech change, focal weakness, loss of consciousness and weakness.  Endo/Heme/Allergies: Does not bruise/bleed easily.  Psychiatric/Behavioral: Negative for substance abuse. The patient is not nervous/anxious.   All other systems reviewed and are negative.    EKGs/Labs/Other Studies Reviewed:    Studies reviewed were summarized above. The additional studies were reviewed today:  2D echo  11/2011: - Left ventricle: The cavity size was normal. There was mild  concentric hypertrophy. Systolic function was normal. The  estimated ejection fraction was in the range of 55% to  60%. Wall motion was normal; there were no regional wall  motion abnormalities. Left ventricular diastolic function  parameters were normal.  - Pulmonary arteries: Systolic pressure was mildly  increased, estimated to be 26mm Hg.  __________  2D echo 02/01/2020: 1. Left ventricular ejection fraction, by estimation, is 55 to 60%. The  left ventricle has normal function. The left ventricle has no regional  wall motion abnormalities. Left ventricular diastolic parameters are  indeterminate.  2. Right ventricular systolic function is normal. The right ventricular  size is normal. There is mildly elevated pulmonary artery systolic  pressure. The estimated right ventricular systolic pressure is 45.0 mmHg.  3. Left atrial  size was mildly dilated.  4. The mitral valve is normal in structure. Trivial mitral valve  regurgitation. No evidence of mitral stenosis.  5. The aortic valve is normal in structure. Aortic valve regurgitation is  not visualized. No aortic stenosis is present.  6. The inferior vena cava is dilated in size with >50% respiratory  variability, suggesting right atrial pressure of 8 mmHg.   Comparison(s): EF 55-60%.    EKG:  EKG is ordered today.  The EKG ordered today demonstrates NSR, 71 bpm, LBBB (known)  Recent Labs: 05/22/2019: ALT 22 05/31/2019: Hemoglobin 8.3; Platelets 256 01/08/2020: BUN 15; Creatinine, Ser 0.78; Potassium 4.6; Sodium 141  Recent Lipid Panel    Component Value Date/Time   CHOL 192 02/13/2019 1147   TRIG 185 (H) 02/13/2019 1147   HDL 58 02/13/2019 1147   CHOLHDL 3.3 02/13/2019 1147   LDLCALC 102 (H) 02/13/2019 1147    PHYSICAL EXAM:    VS:  BP 120/74 (BP Location: Right Wrist, Patient Position: Sitting, Cuff Size: Normal)   Pulse 71   Ht 5\' 3"  (1.6 m)   Wt (!) 403 lb (182.8 kg)   SpO2 98%   BMI 71.39 kg/m   BMI: Body mass index is 71.39 kg/m.  Physical Exam Vitals reviewed.  Constitutional:      Appearance: She is well-developed and well-nourished.  HENT:     Head: Normocephalic and atraumatic.  Eyes:     General:        Right eye: No discharge.        Left eye: No discharge.  Neck:     Vascular: No JVD.  Cardiovascular:     Rate and Rhythm: Normal rate and regular rhythm.     Pulses: No midsystolic click and no opening snap.     Heart sounds: Normal heart sounds, S1 normal and S2 normal. Heart sounds not distant. No murmur heard. No friction rub.  Pulmonary:     Effort: Pulmonary effort is normal. No respiratory distress.     Breath sounds: Normal breath sounds. No decreased breath sounds, wheezing or rales.  Chest:     Chest wall: No tenderness.  Abdominal:     General: There is no distension.     Palpations: Abdomen is soft.      Tenderness: There is no abdominal tenderness.  Musculoskeletal:     Cervical back: Normal range of motion.     Right lower leg: Edema present.     Left lower leg: Edema present.  Skin:    General: Skin is warm and dry.     Nails: There is no clubbing or cyanosis.  Neurological:  Mental Status: She is alert and oriented to person, place, and time.  Psychiatric:        Mood and Affect: Mood and affect normal.        Speech: Speech normal.        Behavior: Behavior normal.        Thought Content: Thought content normal.        Judgment: Judgment normal.     Wt Readings from Last 3 Encounters:  04/26/20 (!) 403 lb (182.8 kg)  01/08/20 (!) 384 lb (174.2 kg)  05/26/19 (!) 350 lb (158.8 kg)     ASSESSMENT & PLAN:   1. Lower extremity swelling/dyspnea: Overall, symptoms are improved on scheduled Lasix 20 mg daily.  Check BMP today.  Replete potassium as indicated.  Suspect some of her elevated PA pressures are multifactorial in the setting of recent volume overload, morbid obesity, OSA with possible OHS.  Volume status is difficult to assess on physical exam secondary to body habitus, though her lungs are clear to auscultation bilaterally.  Suspect some of her lower extremity swelling is dependent edema.  With her elevated weight and some tightness noted in her bilateral lower extremities she will take an extra dose of Lasix this afternoon.  Otherwise, she will continue Lasix 20 mg daily.  Recommend salt and fluid restriction.  2. Paroxysmal atrial tachycardia: Maintaining sinus rhythm.  Well controlled on carvedilol.  3. HTN: Blood pressure is well controlled in the office today.  Continue carvedilol and Lasix.  Low-sodium diet recommended.  4. Morbid obesity with possible OHS: Weight loss is recommended.  Encourage heart healthy diet and exercise program.  5. OSA: Continue CPAP.  She did recently get a new CPAP machine in August or September 2021 and continues to work on its  settings.  6. LBBB: Known.  Recent echo with preserved LV systolic function.  7. GERD: Refill Protonix.  Disposition: F/u with Dr. Kirke CorinArida or an APP in 3 months.   Medication Adjustments/Labs and Tests Ordered: Current medicines are reviewed at length with the patient today.  Concerns regarding medicines are outlined above. Medication changes, Labs and Tests ordered today are summarized above and listed in the Patient Instructions accessible in Encounters.   Signed, Eula Listenyan Roald Lukacs, PA-C 04/26/2020 4:17 PM     [facility] -  [address] [place], [place] [address] [phone]

## 2020-04-26 ENCOUNTER — Ambulatory Visit: Payer: Managed Care, Other (non HMO) | Admitting: Physician Assistant

## 2020-04-26 ENCOUNTER — Other Ambulatory Visit: Payer: Self-pay

## 2020-04-26 ENCOUNTER — Encounter: Payer: Self-pay | Admitting: Physician Assistant

## 2020-04-26 VITALS — BP 120/74 | HR 71 | Ht 63.0 in | Wt >= 6400 oz

## 2020-04-26 DIAGNOSIS — R0602 Shortness of breath: Secondary | ICD-10-CM

## 2020-04-26 DIAGNOSIS — K219 Gastro-esophageal reflux disease without esophagitis: Secondary | ICD-10-CM

## 2020-04-26 DIAGNOSIS — Z9989 Dependence on other enabling machines and devices: Secondary | ICD-10-CM

## 2020-04-26 DIAGNOSIS — I471 Supraventricular tachycardia: Secondary | ICD-10-CM | POA: Diagnosis not present

## 2020-04-26 DIAGNOSIS — I10 Essential (primary) hypertension: Secondary | ICD-10-CM

## 2020-04-26 DIAGNOSIS — M7989 Other specified soft tissue disorders: Secondary | ICD-10-CM | POA: Diagnosis not present

## 2020-04-26 DIAGNOSIS — I447 Left bundle-branch block, unspecified: Secondary | ICD-10-CM

## 2020-04-26 DIAGNOSIS — G4733 Obstructive sleep apnea (adult) (pediatric): Secondary | ICD-10-CM

## 2020-04-26 MED ORDER — PANTOPRAZOLE SODIUM 40 MG PO TBEC: 40.0000 mg | DELAYED_RELEASE_TABLET | Freq: Every day | ORAL | 3 refills | Status: DC

## 2020-04-26 NOTE — Patient Instructions (Signed)
Medication Instructions:  - Your physician recommends that you continue on your current medications as directed. Please refer to the Current Medication list given to you today.  *If you need a refill on your cardiac medications before your next appointment, please call your pharmacy*   Lab Work: - Your physician recommends that you have lab work today: BMP  If you have labs (blood work) drawn today and your tests are completely normal, you will receive your results only by: Marland Kitchen MyChart Message (if you have MyChart) OR . A paper copy in the mail If you have any lab test that is abnormal or we need to change your treatment, we will call you to review the results.   Testing/Procedures: - none ordered   Follow-Up: At [facility], you and your health needs are our priority.  As part of our continuing mission to provide you with exceptional heart care, we have created designated Provider Care Teams.  These Care Teams include your primary Cardiologist (physician) and Advanced Practice Providers (APPs -  Physician Assistants and Nurse Practitioners) who all work together to provide you with the care you need, when you need it.  We recommend signing up for the patient portal called "MyChart".  Sign up information is provided on this After Visit Summary.  MyChart is used to connect with patients for Virtual Visits (Telemedicine).  Patients are able to view lab/test results, encounter notes, upcoming appointments, etc.  Non-urgent messages can be sent to your provider as well.   To learn more about what you can do with MyChart, go to [email or web address].    Your next appointment:   6 month(s)  The format for your next appointment:   In Person  Provider:   You may see Lorine Bears, MD or one of the following Advanced Practice Providers on your designated Care Team:    Nicolasa Ducking, NP  Eula Listen, PA-C  Marisue Ivan, PA-C  Cadence Falkland, [place]  Gillian Shields,  NP    Other Instructions n/a

## 2020-04-27 LAB — BASIC METABOLIC PANEL
BUN/Creatinine Ratio: 22 (ref 9–23)
BUN: 15 mg/dL (ref 6–24)
CO2: 25 mmol/L (ref 20–29)
Calcium: 9 mg/dL (ref 8.7–10.2)
Chloride: 100 mmol/L (ref 96–106)
Creatinine, Ser: 0.69 mg/dL (ref 0.57–1.00)
GFR calc Af Amer: 117 mL/min/{1.73_m2} (ref >59)
GFR calc non Af Amer: 101 mL/min/{1.73_m2} (ref >59)
Glucose: 78 mg/dL (ref 65–99)
Potassium: 4.4 mmol/L (ref 3.5–5.2)
Sodium: 139 mmol/L (ref 134–144)

## 2020-05-14 ENCOUNTER — Other Ambulatory Visit: Payer: Self-pay | Admitting: Physician Assistant

## 2020-05-16 ENCOUNTER — Other Ambulatory Visit: Payer: Self-pay

## 2020-05-16 MED ORDER — CARVEDILOL 12.5 MG PO TABS: ORAL_TABLET | ORAL | 1 refills | Status: DC

## 2020-05-16 NOTE — Telephone Encounter (Signed)
Rx request sent to pharmacy.  

## 2020-11-03 ENCOUNTER — Ambulatory Visit: Payer: Managed Care, Other (non HMO) | Admitting: Physician Assistant

## 2020-11-03 DIAGNOSIS — J029 Acute pharyngitis, unspecified: Secondary | ICD-10-CM | POA: Diagnosis not present

## 2020-11-03 DIAGNOSIS — R0982 Postnasal drip: Secondary | ICD-10-CM | POA: Diagnosis not present

## 2020-11-03 DIAGNOSIS — J069 Acute upper respiratory infection, unspecified: Secondary | ICD-10-CM

## 2020-11-03 NOTE — Progress Notes (Signed)
  Subjective:     Patient ID: [name] [name], female   DOB: [birth year], 50 y.o.   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 Has been up and able to carry out usual duties  Has been seen before in [facility] clinic and has a local PCP [name] [name] reports she has had Covid vaccine x 2 , but no additional boosters She reports a Covid test this morning as NEGATIVE- available at her workplace  Husband has NOT had vaccines.  Grandchild currently of the household   She developed post nasal drip 3-4 days ago which has persisted and she feels has caused her throat to be irritated     She denies fever ,malaise, cough, facial pain,headache, ear pain or pressure ,tooth pain, appetite change , NVD,   Has  thin, pale nasal discharge without blood.   No pressure or pain behind eyebrows or across cheekbones  No ear popping or squeaking No chest pain, no SOB,no cough   She has known seasonal allergies, but reports at this time that she is taking Flonase and cetirizine daily Is doing a good job of inspiration with fluticasone and does not taste" rust or roses"  No ears popping with blow or swallow  She thinks she has a sinus infection and is interested in therapy  Review of Systems  Takes meloxican 15 mg daily or arthritis-- uses tylenol for supplement if discomfort/fever Sleeps with aerosol  CPAP- last recorded  weight 403 pounds  BP 120/74    Objective:   Physical Exam  Telephone exam -  No exam accomplished- sounds relaxed, conversational , full sentences denies fever or malaise- see notes above    Assessment:     Post Nasal drip      Pharyngitis,acute      Viral URI  No fever, facial pain or pressure, no tooth pain, no headache to suggest sinus infection    Plan:  Common cold interventions for comfort Warm salt water gargles  Warm tea with honey May use tylenol for discomfort / already has Meloxicam on board Continue seasonal allergy interventions Increase hydration - has cool mist vaporizer and encourage use in household Repeat Covid testing in 3 days if symptoms persist Mask use encouraged- modified isolation to minimize contacts Disappointed that antibiotic therapy not recommended at this time-discussed Request follow up  in 3 days if not improved, PCP office or here, more rapidly if exacerbation Patient expresses understanding and agrees to plan.

## 2020-11-14 ENCOUNTER — Other Ambulatory Visit: Payer: Self-pay | Admitting: Nurse Practitioner

## 2020-12-28 NOTE — Progress Notes (Signed)
Cardiology Office Note    Date:  01/04/2021   ID:  [name], [name] January 11, 1971, MRN [id number]  PCP:  Olive Bass, MD  Cardiologist:  Lorine Bears, MD  Electrophysiologist:  None   Chief Complaint: Follow up  History of Present Illness:   [name] [name] is a 50 y.o. female with history of atrial tachycardia, LBBB, HTN, obesity, OSA on CPAP, and GERD who presents for follow up of atrial tachycardia.   She was diagnosed with atrial tachycardia in 2009 and underwent successful ablation by Dr. Chales Abrahams in [facility]. Following this, she developed a LBBB. Echo in 2013 showed an EF of 55-60%, no RWMA, normal LV diastolic function parameters, PASP 38 mmHg. She was seen virtually in 12/2018, and was doing well from a cardiac perspective. She underwent a left total hip arthroplasty in 01/2019 and a left total knee arthroplasty (prior right knee TKA in 11/2018).  Following this, she requested a refill on Lasix 20 mg as needed secondary to lower extremity swelling with return to work.  She was seen in the office on 01/08/2020 and was doing reasonably well.  She did continue to note intermittent lower extremity swelling as well as fluctuations in weight and dyspnea.  She was taking Lasix 3-4 times per week.  Her lower extremity swelling was more pronounced after sitting at her desk for prolonged timeframes.  Her Lasix was changed from as needed dosing to 20 mg daily.  Echo on 02/01/2020 showed an EF of 55 to 60%, no regional wall motion abnormalities, indeterminate diastolic function parameters, normal RV systolic function and ventricular cavity size, mildly elevated PASP estimated at 45 mmHg, mildly dilated left atrium, trivial mitral regurgitation, and an estimated right atrial pressure of 8 mmHg.  She was last seen in the office in 04/2020 and was doing well.  With Lasix 20 mg daily, she noted an improvement in her dyspnea and lower extremity swelling.  Her weight was up 19 pounds that day, when compared to  her visit in 12/2019, with this being attributed to the holidays.   She comes in doing well from a cardiac perspective.  No chest pain or dyspnea.  She did note after she took extra Lasix following her last visit, as instructed, her shortness of breath and swelling improved with this.  Given this, she has occasionally taken an extra Lasix 2-3 times per week when she feels like she is holding onto extra fluid.  She notes improvement with this.  Her weight is down 16 pounds today when compared to her last visit, though does report it was down as much as 30 pounds.  This weight loss has been intentional with improving her diet and cutting out some simple carbs.  She remains very active and busy at work.   Labs independently reviewed: 04/2020 - BUN 15, SCr 0.69, potassium 4.4 09/2019 - A1c 5.3 08/2019 - direct LDL 112, TC 185, TG 178, HDL 54 05/2019 - HGB 10.9, PLT 470 04/2019 - albumin 3.9, AST/ALT normal  Past Medical History:  Diagnosis Date   Appendicitis 08/16/2016   Arthritis    Atrial tachycardia, paroxysmal (HCC)    a. s/p ablation in 2009 by Dr. Chales Abrahams in [facility] (falied); b. Managed w/ beta blocker.   GERD (gastroesophageal reflux disease)    Hypertension    LBBB (left bundle branch block)    atrial tach.   Obesity    Sleep apnea    cpap    Past Surgical History:  Procedure Laterality Date   ABLATION OF DYSRHYTHMIC FOCUS  2009   ANTERIOR CRUCIATE LIGAMENT REPAIR     APPLICATION OF WOUND VAC Right 11/06/2018   Procedure: APPLICATION OF WOUND VAC;  Surgeon: Kennedy BuckerMenz, Michael, MD;  Location: [facility] ORS;  Service: Orthopedics;  Laterality: Right;  [id number]   LAPAROSCOPIC APPENDECTOMY N/A 08/16/2016   Procedure: APPENDECTOMY LAPAROSCOPIC;  Surgeon: Lattie Hawichard E Cooper, MD;  Location: [facility] ORS;  Service: General;  Laterality: N/A;   TONSILLECTOMY     TOTAL HIP ARTHROPLASTY Left 01/29/2019   Procedure: TOTAL HIP ARTHROPLASTY ANTERIOR APPROACH;  Surgeon: Kennedy BuckerMenz, Michael, MD;  Location: [facility] ORS;   Service: Orthopedics;  Laterality: Left;   TOTAL KNEE ARTHROPLASTY Right 11/06/2018   Procedure: RIGHT TOTAL KNEE ARTHROPLASTY;  Surgeon: Kennedy BuckerMenz, Michael, MD;  Location: [facility] ORS;  Service: Orthopedics;  Laterality: Right;   TOTAL KNEE ARTHROPLASTY Left 05/26/2019   Procedure: TOTAL KNEE ARTHROPLASTY;  Surgeon: Kennedy BuckerMenz, Michael, MD;  Location: [facility] ORS;  Service: Orthopedics;  Laterality: Left;    Current Medications: Current Meds  Medication Sig   acetaminophen (TYLENOL) 500 MG tablet Take 1,000 mg by mouth every 6 (six) hours as needed for moderate pain or headache.   carvedilol (COREG) 12.5 MG tablet TAKE 1 TABLET TWICE A DAY   meloxicam (MOBIC) 15 MG tablet Take 15 mg by mouth daily.   Multiple Vitamins-Minerals (EQ MULTIVITAMINS ADULT GUMMY PO) Take 2 each by mouth daily.   pantoprazole (PROTONIX) 40 MG tablet Take 1 tablet (40 mg total) by mouth at bedtime.   [DISCONTINUED] furosemide (LASIX) 20 MG tablet TAKE 1 TABLET DAILY    Allergies:   Aspirin   Social History   Socioeconomic History   Marital status: Married    Spouse name: Not on file   Number of children: Not on file   Years of education: Not on file   Highest education level: Not on file  Occupational History   Not on file  Tobacco Use   Smoking status: Never   Smokeless tobacco: Never  Vaping Use   Vaping Use: Never used  Substance and Sexual Activity   Alcohol use: No   Drug use: No   Sexual activity: Not on file  Other Topics Concern   Not on file  Social History Narrative   Not on file   Social Determinants of Health   Financial Resource Strain: Not on file  Food Insecurity: Not on file  Transportation Needs: Not on file  Physical Activity: Not on file  Stress: Not on file  Social Connections: Not on file     Family History:  The patient's family history includes Hyperlipidemia in her mother; Hypertension in her father and mother.  ROS:   Review of Systems  Constitutional:  Negative for chills,  diaphoresis, fever, malaise/fatigue and weight loss.  HENT:  Negative for congestion.   Eyes:  Negative for discharge and redness.  Respiratory:  Negative for cough, sputum production, shortness of breath and wheezing.   Cardiovascular:  Positive for leg swelling. Negative for chest pain, palpitations, orthopnea, claudication and PND.  Gastrointestinal:  Negative for abdominal pain, heartburn, nausea and vomiting.  Musculoskeletal:  Negative for falls and myalgias.  Skin:  Negative for rash.  Neurological:  Negative for dizziness, tingling, tremors, sensory change, speech change, focal weakness, loss of consciousness and weakness.  Endo/Heme/Allergies:  Does not bruise/bleed easily.  Psychiatric/Behavioral:  Negative for substance abuse. The patient is not nervous/anxious.   All other systems reviewed and are negative.  EKGs/Labs/Other Studies Reviewed:    Studies reviewed were summarized above. The additional studies were reviewed today:  2D echo 11/2011: - Left ventricle: The cavity size was normal. There was mild    concentric hypertrophy. Systolic function was normal. The    estimated ejection fraction was in the range of 55% to    60%. Wall motion was normal; there were no regional wall    motion abnormalities. Left ventricular diastolic function    parameters were normal.  - Pulmonary arteries: Systolic pressure was mildly    increased, estimated to be 42mm Hg.  __________   2D echo 02/01/2020: 1. Left ventricular ejection fraction, by estimation, is 55 to 60%. The  left ventricle has normal function. The left ventricle has no regional  wall motion abnormalities. Left ventricular diastolic parameters are  indeterminate.   2. Right ventricular systolic function is normal. The right ventricular  size is normal. There is mildly elevated pulmonary artery systolic  pressure. The estimated right ventricular systolic pressure is 45.0 mmHg.   3. Left atrial size was mildly dilated.    4. The mitral valve is normal in structure. Trivial mitral valve  regurgitation. No evidence of mitral stenosis.   5. The aortic valve is normal in structure. Aortic valve regurgitation is  not visualized. No aortic stenosis is present.   6. The inferior vena cava is dilated in size with >50% respiratory  variability, suggesting right atrial pressure of 8 mmHg.   Comparison(s): EF 55-60%.     EKG:  EKG is ordered today.  The EKG ordered today demonstrates NSR, 65 bpm, LBBB  Recent Labs: 04/26/2020: BUN 15; Creatinine, Ser 0.69; Potassium 4.4; Sodium 139  Recent Lipid Panel    Component Value Date/Time   CHOL 192 02/13/2019 1147   TRIG 185 (H) 02/13/2019 1147   HDL 58 02/13/2019 1147   CHOLHDL 3.3 02/13/2019 1147   LDLCALC 102 (H) 02/13/2019 1147    PHYSICAL EXAM:    VS:  BP 132/80 (BP Location: Left Arm, Patient Position: Sitting, Cuff Size: Large)   Pulse 65   Ht 5\' 4"  (1.626 m)   Wt (!) 387 lb (175.5 kg)   SpO2 97%   BMI 66.43 kg/m   BMI: Body mass index is 66.43 kg/m.  Physical Exam Vitals reviewed.  Constitutional:      Appearance: She is well-developed.  HENT:     Head: Normocephalic and atraumatic.  Eyes:     General:        Right eye: No discharge.        Left eye: No discharge.  Neck:     Vascular: No JVD.  Cardiovascular:     Rate and Rhythm: Normal rate and regular rhythm.     Pulses:          Posterior tibial pulses are 2+ on the right side and 2+ on the left side.     Heart sounds: Normal heart sounds, S1 normal and S2 normal. Heart sounds not distant. No midsystolic click and no opening snap. No murmur heard.   No friction rub.  Pulmonary:     Effort: Pulmonary effort is normal. No respiratory distress.     Breath sounds: Normal breath sounds. No decreased breath sounds, wheezing or rales.  Chest:     Chest wall: No tenderness.  Abdominal:     General: There is no distension.     Palpations: Abdomen is soft.     Tenderness: There is no  abdominal tenderness.  Musculoskeletal:     Cervical back: Normal range of motion.     Right lower leg: Edema present.     Left lower leg: Edema present.  Skin:    General: Skin is warm and dry.     Nails: There is no clubbing.  Neurological:     Mental Status: She is alert and oriented to person, place, and time.  Psychiatric:        Speech: Speech normal.        Behavior: Behavior normal.        Thought Content: Thought content normal.        Judgment: Judgment normal.    Wt Readings from Last 3 Encounters:  01/04/21 (!) 387 lb (175.5 kg)  04/26/20 (!) 403 lb (182.8 kg)  01/08/20 (!) 384 lb (174.2 kg)     ASSESSMENT & PLAN:   Lower extremity swelling/dyspnea: Overall, symptoms are stable to improved on scheduled Lasix 20 mg daily.  She did note an improvement in symptoms with taking an additional as needed Lasix 2-3 times per week.  Given this, we will change her Lasix instructions to 20 mg daily with an extra 20 mg as needed shortness of breath.  Check BMP today.  Continue to suspect some of her elevated PA pressures noted on echo are multifactorial in the setting of morbid obesity, OSA, and OHS.  Volume status is secondary to assess on physical exam secondary to body habitus, though I do suspect she is euvolemic and well compensated.  Continue to recommend salt and fluid restriction.  Paroxysmal atrial tachycardia: Quiescent.  Maintaining sinus rhythm.  She remains on carvedilol.  HTN: Blood pressure is reasonably controlled in the office today.  Continue carvedilol and furosemide as above.  Morbid obesity with possible OHS: Weight loss is encouraged through heart healthy diet and regular exercise.  We have set a goal for her to lose 20 pounds before her next visit in 6 months.  OSA: Continue CPAP.  LBBB: Known and stable.  Recent echo demonstrated preserved LV systolic function.  No symptoms of syncope.  Disposition: F/u with Dr. Kirke Corin or an APP in 6 months.   Medication  Adjustments/Labs and Tests Ordered: Current medicines are reviewed at length with the patient today.  Concerns regarding medicines are outlined above. Medication changes, Labs and Tests ordered today are summarized above and listed in the Patient Instructions accessible in Encounters.   Signed, Eula Listen, PA-C 01/04/2021 10:16 AM     [facility] [place] [address] [place], [place] [address] [phone]

## 2021-01-04 ENCOUNTER — Other Ambulatory Visit: Payer: Self-pay

## 2021-01-04 ENCOUNTER — Encounter: Payer: Self-pay | Admitting: Physician Assistant

## 2021-01-04 ENCOUNTER — Ambulatory Visit: Payer: Managed Care, Other (non HMO) | Admitting: Physician Assistant

## 2021-01-04 VITALS — BP 132/80 | HR 65 | Ht 64.0 in | Wt 387.0 lb

## 2021-01-04 DIAGNOSIS — I471 Supraventricular tachycardia: Secondary | ICD-10-CM

## 2021-01-04 DIAGNOSIS — R0602 Shortness of breath: Secondary | ICD-10-CM | POA: Diagnosis not present

## 2021-01-04 DIAGNOSIS — Z9989 Dependence on other enabling machines and devices: Secondary | ICD-10-CM

## 2021-01-04 DIAGNOSIS — I10 Essential (primary) hypertension: Secondary | ICD-10-CM

## 2021-01-04 DIAGNOSIS — M7989 Other specified soft tissue disorders: Secondary | ICD-10-CM | POA: Diagnosis not present

## 2021-01-04 DIAGNOSIS — I447 Left bundle-branch block, unspecified: Secondary | ICD-10-CM

## 2021-01-04 DIAGNOSIS — G4733 Obstructive sleep apnea (adult) (pediatric): Secondary | ICD-10-CM

## 2021-01-04 DIAGNOSIS — Z79899 Other long term (current) drug therapy: Secondary | ICD-10-CM

## 2021-01-04 MED ORDER — FUROSEMIDE 20 MG PO TABS: ORAL_TABLET | ORAL | 1 refills | Status: DC

## 2021-01-04 NOTE — Patient Instructions (Addendum)
Medication Instructions:  - Your physician has recommended you make the following change in your medication:   1) CHANGE Lasix 20 mg: - take 1 tablet (20 mg) by mouth once daily, and you may take 1 extra tablet (20 mg) as needed for shortness of breath  *If you need a refill on your cardiac medications before your next appointment, please call your pharmacy*   Lab Work: - Your physician recommends that you have lab work today: BMP    If you have labs (blood work) drawn today and your tests are completely normal, you will receive your results only by: MyChart Message (if you have MyChart) OR A paper copy in the mail If you have any lab test that is abnormal or we need to change your treatment, we will call you to review the results.   Testing/Procedures: - none ordered   Follow-Up: At [facility], you and your health needs are our priority.  As part of our continuing mission to provide you with exceptional heart care, we have created designated Provider Care Teams.  These Care Teams include your primary Cardiologist (physician) and Advanced Practice Providers (APPs -  Physician Assistants and Nurse Practitioners) who all work together to provide you with the care you need, when you need it.  We recommend signing up for the patient portal called "MyChart".  Sign up information is provided on this After Visit Summary.  MyChart is used to connect with patients for Virtual Visits (Telemedicine).  Patients are able to view lab/test results, encounter notes, upcoming appointments, etc.  Non-urgent messages can be sent to your provider as well.   To learn more about what you can do with MyChart, go to [email or web address].    Your next appointment:   6 month(s)  The format for your next appointment:   In Person  Provider:   You may see Lorine Bears, MD or one of the following Advanced Practice Providers on your designated Care Team:   Nicolasa Ducking, NP Eula Listen,  PA-C Marisue Ivan, PA-C Cadence Fransico Michael, [place]   Other Instructions N/a

## 2021-01-05 LAB — BASIC METABOLIC PANEL
BUN/Creatinine Ratio: 20 (ref 9–23)
BUN: 16 mg/dL (ref 6–24)
CO2: 26 mmol/L (ref 20–29)
Calcium: 9 mg/dL (ref 8.7–10.2)
Chloride: 98 mmol/L (ref 96–106)
Creatinine, Ser: 0.81 mg/dL (ref 0.57–1.00)
Glucose: 113 mg/dL — ABNORMAL HIGH (ref 65–99)
Potassium: 4.9 mmol/L (ref 3.5–5.2)
Sodium: 139 mmol/L (ref 134–144)
eGFR: 87 mL/min/{1.73_m2} (ref >59)

## 2021-04-24 ENCOUNTER — Other Ambulatory Visit: Payer: Self-pay | Admitting: Physician Assistant

## 2021-06-20 ENCOUNTER — Other Ambulatory Visit: Payer: Self-pay

## 2021-06-20 MED ORDER — FUROSEMIDE 20 MG PO TABS: ORAL_TABLET | ORAL | 3 refills | Status: DC

## 2021-07-13 ENCOUNTER — Ambulatory Visit: Payer: Managed Care, Other (non HMO) | Admitting: Cardiovascular Disease

## 2021-07-24 ENCOUNTER — Other Ambulatory Visit: Payer: Self-pay | Admitting: Physician Assistant

## 2021-09-01 ENCOUNTER — Encounter: Payer: Self-pay | Admitting: Cardiovascular Disease

## 2021-09-01 ENCOUNTER — Ambulatory Visit: Payer: Managed Care, Other (non HMO) | Admitting: Cardiovascular Disease

## 2021-09-01 VITALS — BP 136/79 | HR 70 | Ht 63.0 in | Wt >= 6400 oz

## 2021-09-01 DIAGNOSIS — G4733 Obstructive sleep apnea (adult) (pediatric): Secondary | ICD-10-CM

## 2021-09-01 DIAGNOSIS — I10 Essential (primary) hypertension: Secondary | ICD-10-CM | POA: Diagnosis not present

## 2021-09-01 DIAGNOSIS — I5032 Chronic diastolic (congestive) heart failure: Secondary | ICD-10-CM

## 2021-09-01 DIAGNOSIS — Z9989 Dependence on other enabling machines and devices: Secondary | ICD-10-CM

## 2021-09-01 DIAGNOSIS — I471 Supraventricular tachycardia: Secondary | ICD-10-CM | POA: Diagnosis not present

## 2021-09-01 NOTE — Progress Notes (Signed)
?  ?Cardiology Office Note ? ? ?Date:  09/01/2021  ? ?ID:  [name] [name], DOB 1970/12/10, MRN [id number] ? ?PCP:  Olive Bass, MD  ?Cardiologist:   Lorine Bears, MD  ? ?Chief Complaint  ?Patient presents with  ? Other  ?  6 Month f/u no complaints today. Meds reviewed verbally with pt.  ? ? ?  ?History of Present Illness: ?[name] [name] is a 51 y.o. female who presents for a follow-up visit regarding paroxysmal supraventricular tachycardia, left bundle branch block and essential hypertension. ?In 2009 she was diagnosed with atrial tachycardia and underwent unsuccessful ablation by Dr. Chales Abrahams in [place]. She developed left bundle branch block after that. ?She has been treated with carvedilol for both palpitations and essential hypertension. She has known history of sleep apnea on CPAP.   ?She had bilateral knee replacement in the past. ?Most recent echocardiogram in October 2021 showed normal LV systolic function, indeterminate diastolic function and mild pulmonary hypertension. ? ?She has been doing reasonably well with no recent chest pain or worsening dyspnea.  No palpitations or tachycardia.  She reports recent fluid retention requiring 2 tablets of furosemide.  This is in the setting of an abnormal cyclic yearly menstrual bleeding. ? ? ?Past Medical History:  ?Diagnosis Date  ? Appendicitis 08/16/2016  ? Arthritis   ? Atrial tachycardia, paroxysmal (HCC)   ? a. s/p ablation in 2009 by Dr. Chales Abrahams in [facility] (falied); b. Managed w/ beta blocker.  ? GERD (gastroesophageal reflux disease)   ? Hypertension   ? LBBB (left bundle branch block)   ? atrial tach.  ? Obesity   ? Sleep apnea   ? cpap  ? ? ?Past Surgical History:  ?Procedure Laterality Date  ? ABLATION OF DYSRHYTHMIC FOCUS  2009  ? ANTERIOR CRUCIATE LIGAMENT REPAIR    ? APPLICATION OF WOUND VAC Right 11/06/2018  ? Procedure: APPLICATION OF WOUND VAC;  Surgeon: Kennedy Bucker, MD;  Location: [facility] ORS;  Service: Orthopedics;  Laterality: Right;   [id number]  ? LAPAROSCOPIC APPENDECTOMY N/A 08/16/2016  ? Procedure: APPENDECTOMY LAPAROSCOPIC;  Surgeon: Lattie Haw, MD;  Location: [facility] ORS;  Service: General;  Laterality: N/A;  ? TONSILLECTOMY    ? TOTAL HIP ARTHROPLASTY Left 01/29/2019  ? Procedure: TOTAL HIP ARTHROPLASTY ANTERIOR APPROACH;  Surgeon: Kennedy Bucker, MD;  Location: [facility] OR[facility];  Service: Orthopedics;  Laterality: Left;  ? TOTAL KNEE ARTHROPLASTY Right 11/06/2018  ? Procedure: RIGHT TOTAL KNEE ARTHROPLASTY;  Surgeon: Kennedy Bucker, MD;  Location: [facility] OR[facility];  Service: Orthopedics;  Laterality: Right;  ? TOTAL KNEE ARTHROPLASTY Left 05/26/2019  ? Procedure: TOTAL KNEE ARTHROPLASTY;  Surgeon: Kennedy Bucker, MD;  Location: [facility] ORS;  Service: Orthopedics;  Laterality: Left;  ? ? ? ?Current Outpatient Medications  ?Medication Sig Dispense Refill  ? acetaminophen (TYLENOL) 500 MG tablet Take 1,000 mg by mouth every 6 (six) hours as needed for moderate pain or headache.    ? carvedilol (COREG) 12.5 MG tablet TAKE 1 TABLET TWICE A DAY 180 tablet 3  ? Cyanocobalamin (B-12 PO) Take 2,000 mg by mouth daily in the afternoon.    ? furosemide (LASIX) 20 MG tablet Take 1 tablet (20 mg) by mouth once daily. You may take 1 extra tablet (20 mg) once daily as needed for shortness of breath 180 tablet 3  ? meloxicam (MOBIC) 15 MG tablet Take 15 mg by mouth daily.    ? Multiple Vitamins-Minerals (EQ MULTIVITAMINS ADULT GUMMY PO) Take 2 each by mouth daily.    ?  pantoprazole (PROTONIX) 40 MG tablet TAKE 1 TABLET AT BEDTIME 90 tablet 0  ? ?No current facility-administered medications for this visit.  ? ? ?Allergies:   Aspirin  ? ? ?Social History:  The patient  reports that she has never smoked. She has never used smokeless tobacco. She reports that she does not drink alcohol and does not use drugs.  ? ?Family History:  The patient's family history includes Hyperlipidemia in her mother; Hypertension in her father and mother.  ? ? ?ROS:  Please see the history of present  illness.   Otherwise, review of systems are positive for none.   All other systems are reviewed and negative.  ? ? ?PHYSICAL EXAM: ?VS:  BP 136/79 (BP Location: Left Wrist, Patient Position: Sitting, Cuff Size: Large)   Pulse 70   Ht 5\' 3"  (1.6 m)   Wt (!) 405 lb 6 oz (183.9 kg)   SpO2 98%   BMI 71.81 kg/m?  , BMI Body mass index is 71.81 kg/m?. ?GEN: Well nourished, well developed, in no acute distress  ?HEENT: normal  ?Neck: no JVD, carotid bruits, or masses ?Cardiac: RRR; no murmurs, rubs, or gallops,no edema  ?Respiratory:  clear to auscultation bilaterally, normal work of breathing ?GI: soft, nontender, nondistended, + BS ?MS: no deformity or atrophy  ?Skin: warm and dry, no rash ?Neuro:  Strength and sensation are intact ?Psych: euthymic mood, full affect ? ? ?EKG:  EKG is ordered today. ?The ekg ordered today demonstrates normal sinus rhythm with left bundle branch block. ? ? ?Recent Labs: ?01/04/2021: BUN 16; Creatinine, Ser 0.81; Potassium 4.9; Sodium 139  ? ? ?Lipid Panel ?   ?Component Value Date/Time  ? CHOL 192 02/13/2019 1147  ? TRIG 185 (H) 02/13/2019 1147  ? HDL 58 02/13/2019 1147  ? CHOLHDL 3.3 02/13/2019 1147  ? LDLCALC 102 (H) 02/13/2019 1147  ? ?  ? ?Wt Readings from Last 3 Encounters:  ?09/01/21 (!) 405 lb 6 oz (183.9 kg)  ?01/04/21 (!) 387 lb (175.5 kg)  ?04/26/20 (!) 403 lb (182.8 kg)  ?  ? ? ? ?   ? View : No data to display.  ?  ?  ?  ? ? ? ? ?ASSESSMENT AND PLAN: ? ?1.  Paroxysmal atrial tachycardia: No evidence of recurrent arrhythmia. Continue current dose of carvedilol. ? ?2. Essential hypertension: Blood pressure is well controlled on carvedilol. ? ?3. Morbid obesity: Unfortunately, she has not been able to lose any weight.  We discussed the option of surgical weight loss.  She is interested but she reports that the surgery is not covered by her insurance. ? ?4. Obstructive sleep apnea: Continue treatment with CPAP. ? ?5.  Left bundle branch block: This is not associated with  cardiomyopathy.  Monitor closely. ? ?6.  Chronic diastolic heart failure: I suspect that she does have some degree of diastolic heart failure with mild pulmonary hypertension.  She appears to be euvolemic on furosemide. ? ? ? ?Disposition:   FU with me in 1 year ? ?Signed, ? ?06/24/20, MD  ?09/01/2021 8:41 AM    ?[facility] ?

## 2021-09-01 NOTE — Patient Instructions (Signed)

## 2021-10-23 ENCOUNTER — Other Ambulatory Visit: Payer: Self-pay | Admitting: Physician Assistant

## 2021-11-07 ENCOUNTER — Other Ambulatory Visit: Payer: Self-pay | Admitting: Nurse Practitioner

## 2022-05-29 ENCOUNTER — Other Ambulatory Visit: Payer: Self-pay

## 2022-05-29 MED ORDER — FUROSEMIDE 20 MG PO TABS: ORAL_TABLET | ORAL | 0 refills | Status: DC

## 2022-09-06 ENCOUNTER — Ambulatory Visit: Payer: Managed Care, Other (non HMO) | Attending: Nurse Practitioner | Admitting: Nurse Practitioner

## 2022-09-06 ENCOUNTER — Encounter: Payer: Self-pay | Admitting: Nurse Practitioner

## 2022-09-06 VITALS — BP 148/88 | HR 66 | Ht 62.0 in | Wt 386.6 lb

## 2022-09-06 DIAGNOSIS — I5032 Chronic diastolic (congestive) heart failure: Secondary | ICD-10-CM

## 2022-09-06 DIAGNOSIS — E782 Mixed hyperlipidemia: Secondary | ICD-10-CM | POA: Diagnosis not present

## 2022-09-06 DIAGNOSIS — I4719 Other supraventricular tachycardia: Secondary | ICD-10-CM | POA: Diagnosis not present

## 2022-09-06 DIAGNOSIS — I10 Essential (primary) hypertension: Secondary | ICD-10-CM

## 2022-09-06 DIAGNOSIS — Z01810 Encounter for preprocedural cardiovascular examination: Secondary | ICD-10-CM

## 2022-09-06 MED ORDER — FUROSEMIDE 20 MG PO TABS: ORAL_TABLET | ORAL | 3 refills | Status: DC

## 2022-09-06 MED ORDER — CARVEDILOL 12.5 MG PO TABS: 12.5000 mg | ORAL_TABLET | Freq: Two times a day (BID) | ORAL | 3 refills | Status: DC

## 2022-09-06 NOTE — Progress Notes (Signed)
Office Visit    Patient Name: [name] [name] Date of Encounter: 09/06/2022  Primary Care Provider:  Olive Bass, MD Primary Cardiologist:  [name] Bears, MD  Chief Complaint    52 year old female with a history of PSVT and atrial tachycardia status post unsuccessful ablation in 2009, chronic left bundle branch block, chronic HFpEF, hypertension, obesity, and sleep apnea on CPAP, who presents for follow-up related to HFpEF and preop eval.  Past Medical History    Past Medical History:  Diagnosis Date   Appendicitis 08/16/2016   Arthritis    Atrial tachycardia, paroxysmal    a. s/p ablation in 2009 by Dr. Chales [name] in [facility] (falied); b. Managed w/ beta blocker.   Chronic heart failure with preserved ejection fraction (HFpEF) (HCC)    a. 11/2011 Echo: EF 55-60%; b. 01/2020 Echo: EF 55-60%, no rmwa, nl RV fxn, RVSP . Mildly dil LA. Triv MR.   GERD (gastroesophageal reflux disease)    Hypertension    LBBB (left bundle branch block)    Obesity    Sleep apnea    cpap   Past Surgical History:  Procedure Laterality Date   ABLATION OF DYSRHYTHMIC FOCUS  2009   ANTERIOR CRUCIATE LIGAMENT REPAIR     APPLICATION OF WOUND VAC Right 11/06/2018   Procedure: APPLICATION OF WOUND VAC;  Surgeon: [name] Bucker, MD;  Location: [facility] OR[facility];  Service: Orthopedics;  Laterality: Right;  [id number]   LAPAROSCOPIC APPENDECTOMY N/A 08/16/2016   Procedure: APPENDECTOMY LAPAROSCOPIC;  Surgeon: [name] Haw, MD;  Location: [facility];  Service: General;  Laterality: N/A;   TONSILLECTOMY     TOTAL HIP ARTHROPLASTY Left 01/29/2019   Procedure: TOTAL HIP ARTHROPLASTY ANTERIOR APPROACH;  Surgeon: [name] Bucker, MD;  Location: [facility];  Service: Orthopedics;  Laterality: Left;   TOTAL KNEE ARTHROPLASTY Right 11/06/2018   Procedure: RIGHT TOTAL KNEE ARTHROPLASTY;  Surgeon: [name] Bucker, MD;  Location: [facility];  Service: Orthopedics;  Laterality: Right;   TOTAL KNEE ARTHROPLASTY Left 05/26/2019    Procedure: TOTAL KNEE ARTHROPLASTY;  Surgeon: [name] Bucker, MD;  Location: [facility];  Service: Orthopedics;  Laterality: Left;    Allergies  Allergies  Allergen Reactions   Aspirin Swelling    History of Present Illness    52 year old female with a history of PSVT and atrial tachycardia status post unsuccessful ablation in 2009, chronic left bundle branch block, chronic HFpEF, hypertension, obesity, and sleep apnea on CPAP.  As noted above, she was diagnosed with atrial tachycardia in 2009 underwent unsuccessful ablation.  She subsequently developed a left bundle branch block.  Historically, palpitations been managed with carvedilol therapy.  Most recent echocardiogram in October 2021 showed EF 55 to 60% with normal RV function, RVSP of 45 mmHg, and trivial MR.  [name] [name] was last seen in cardiology clinic in May 2023, at which time she was doing well.  Since then, she has done well from a cardiac standpoint.  She is now on contrave for wt loss and has lost 26 lbs thus far.  She has a Control and instrumentation engineer and feels that this process has been beneficial and looks forward to losing more weight.  She was not able to get the semaglutide through her insurance.  She denies chest pain, dyspnea, palpitations, PND, orthopnea, dizziness, syncope, edema, or early satiety.  She is pending a colonoscopy with general anesthesia in [place] and needs cardiac clearance.  She is capable of achieving 4 METS without symptoms or limitations.  Home Medications  Current Outpatient Medications  Medication Sig Dispense Refill   acetaminophen (TYLENOL) 500 MG tablet Take 1,000 mg by mouth every 6 (six) hours as needed for moderate pain or headache.     Calcium Carbonate Antacid (CALCIUM CARBONATE PO) Take 1 capsule by mouth daily.     Naltrexone-buPROPion HCl ER (CONTRAVE) 8-90 MG TB12 Take by mouth. Take 2 tablets by mouth 2 (two) times a day. For weight management     pantoprazole (PROTONIX) 40 MG tablet TAKE 1 TABLET  AT BEDTIME 90 tablet 3   Potassium 99 MG TABS Take 99 mg by mouth daily.     VITAMIN D, CHOLECALCIFEROL, PO Take 1 capsule by mouth daily.     carvedilol (COREG) 12.5 MG tablet Take 1 tablet (12.5 mg total) by mouth 2 (two) times daily. 180 tablet 3   Cyanocobalamin (B-12 PO) Take 2,000 mg by mouth daily in the afternoon. (Patient not taking: Reported on 09/06/2022)     furosemide (LASIX) 20 MG tablet Take 1 tablet (20 mg) by mouth once daily. You may take 1 extra tablet (20 mg) once daily as needed for shortness of breath 180 tablet 3   meloxicam (MOBIC) 15 MG tablet Take 15 mg by mouth daily. (Patient not taking: Reported on 09/06/2022)     Multiple Vitamins-Minerals (EQ MULTIVITAMINS ADULT GUMMY PO) Take 2 each by mouth daily. (Patient not taking: Reported on 09/06/2022)     No current facility-administered medications for this visit.     Review of Systems    She denies chest pain, palpitations, dyspnea, pnd, orthopnea, n, v, dizziness, syncope, edema, weight gain, or early satiety.  All other systems reviewed and are otherwise negative except as noted above.    Physical Exam    VS:  BP (!) 152/91 (BP Location: Left Arm, Patient Position: Sitting, Cuff Size: Normal) Comment (BP Location): forearm  Pulse 66   Ht 5\' 2"  (1.575 m)   Wt (!) 386 lb 9.6 oz (175.4 kg)   SpO2 97%   BMI 70.71 kg/m  , BMI Body mass index is 70.71 kg/m.     Vitals:   09/06/22 1106 09/06/22 1247  BP: (!) 152/91 (!) 148/88  Pulse: 66   SpO2: 97%     GEN: Well nourished, well developed, in no acute distress. HEENT: normal. Neck: Supple, no JVD, carotid bruits, or masses. Cardiac: RRR, no murmurs, rubs, or gallops. No clubbing, cyanosis, edema.  Radials 2+/PT 2+ and equal bilaterally.  Respiratory:  Respirations regular and unlabored, clear to auscultation bilaterally. GI: Soft, nontender, nondistended, BS + x 4. MS: no deformity or atrophy. Skin: warm and dry, no rash. Neuro:  Strength and sensation are  intact. Psych: Normal affect.  Accessory Clinical Findings    ECG personally reviewed by me today -regular sinus rhythm, 66, left axis deviation, left bundle branch block- no acute changes.  Labs from care everywhere dated July 20, 2022:  Hemoglobin 13.0, hematocrit 39.5, WBC 7.3, platelet 288 Sodium 137, potassium 4.2, chloride 98, CO2 30, BUN 17, creatinine 0.82 Calcium 9.2 Total cholesterol 194, triglycerides 127, HDL 58, LDL 112  Assessment & Plan    1.  PSVT/atrial tachycardia: Quiescent on beta-blocker therapy.  2.  Essential hypertension: Blood pressure elevated today on 2 readings.  Discussed options for management including losartan 25 mg daily.  Patient prefers to follow blood pressure trends at home as she is hopeful that weight loss will obviate the need for additional agent.  She will contact us in about  2 weeks with some trends.  3.  Chronic heart failure with preserved ejection fraction: Body habitus makes exam challenging but she does not appear to be significantly volume overloaded today and has not been having any dyspnea.  She has been using Lasix 40 mg twice daily and is without edema.  Renal function electrolytes stable by labs in March.  4.  Preoperative cardiovascular examination: Patient is pending colonoscopy under general anesthesia.  She notes good activity tolerance with peak achievable METS of 8.0 (heavy work around the house) without symptoms or limitations.  In that setting, she is low risk for perioperative complications related to colonoscopy/general anesthesia, and may proceed without additional ischemic testing.  5.  Morbid obesity: Actively engaged in a weight loss program.  Down 26 pounds since starting Contrave, and also seeing a Control and instrumentation engineer.  Congratulated her on her progress.  6.  Hyperlipidemia: LDL above goal at 112.  Actively working on losing weight as outlined above.  7.  Disposition: Follow-up in clinic in 1 year or sooner if  necessary.   Nicolasa Ducking, NP 09/06/2022, 12:42 PM

## 2022-09-06 NOTE — Patient Instructions (Signed)
Medication Instructions:  Your Physician recommend you continue on your current medication as directed.     *If you need a refill on your cardiac medications before your next appointment, please call your pharmacy*   Lab Work: No labs ordered.   Testing/Procedures: No testing ordered.    Follow-Up: At [facility], you and your health needs are our priority.  As part of our continuing mission to provide you with exceptional heart care, we have created designated Provider Care Teams.  These Care Teams include your primary Cardiologist (physician) and Advanced Practice Providers (APPs -  Physician Assistants and Nurse Practitioners) who all work together to provide you with the care you need, when you need it.  We recommend signing up for the patient portal called "MyChart".  Sign up information is provided on this After Visit Summary.  MyChart is used to connect with patients for Virtual Visits (Telemedicine).  Patients are able to view lab/test results, encounter notes, upcoming appointments, etc.  Non-urgent messages can be sent to your provider as well.   To learn more about what you can do with MyChart, go to [email or web address].    Your next appointment:   12 month(s)  Provider:   You may see Lorine Bears, MD or one of the following Advanced Practice Providers on your designated Care Team:   Nicolasa Ducking, NP Eula Listen, PA-C Cadence Fransico Michael, PA-C Charlsie Quest, NP    Other Instructions Please keep a record of blood pressures and send in by MyChart or can call.

## 2022-10-16 ENCOUNTER — Other Ambulatory Visit: Payer: Self-pay | Admitting: Physician Assistant

## 2023-05-06 ENCOUNTER — Encounter (HOSPITAL_BASED_OUTPATIENT_CLINIC_OR_DEPARTMENT_OTHER): Payer: Self-pay

## 2023-05-06 ENCOUNTER — Ambulatory Visit (HOSPITAL_BASED_OUTPATIENT_CLINIC_OR_DEPARTMENT_OTHER)
Admission: RE | Admit: 2023-05-06 | Discharge: 2023-05-06 | Disposition: A | Discharge status: Home / Self Care | Payer: Managed Care, Other (non HMO) | Source: Ambulatory Visit | Attending: Internal Medicine | Admitting: Internal Medicine

## 2023-05-06 ENCOUNTER — Other Ambulatory Visit (HOSPITAL_BASED_OUTPATIENT_CLINIC_OR_DEPARTMENT_OTHER): Payer: Self-pay

## 2023-05-06 VITALS — BP 171/81 | HR 68 | Temp 98.0°F | Resp 20 | Wt 362.0 lb

## 2023-05-06 DIAGNOSIS — R0981 Nasal congestion: Secondary | ICD-10-CM

## 2023-05-06 MED ORDER — AMOXICILLIN-POT CLAVULANATE 875-125 MG PO TABS: 1.0000 | ORAL_TABLET | Freq: Two times a day (BID) | ORAL | 0 refills | Status: DC

## 2023-05-06 MED ORDER — METHYLPREDNISOLONE 4 MG PO TBPK
ORAL_TABLET | ORAL | 0 refills | Status: DC
  Filled 2023-05-06: qty 21, 6d supply, fill #0

## 2023-05-06 NOTE — ED Triage Notes (Signed)
 Onset 1 week ago of thick nasal secretions. No fever, no cough. Has taken over the counter decongestants with little relief.

## 2023-05-06 NOTE — ED Provider Notes (Signed)
 [facility]    CSN: [id number] Arrival date & time: 05/06/23  0851      History   Chief Complaint Chief Complaint  Patient presents with   Nasal Congestion    Been congested for a week and now thick and green - Entered by patient    HPI [name] [name] is a 53 y.o. female who presents with nose congestion x 1 week. Has not had a fever or cough. Denies face pain or history of sinus surgeries. Initially she thought it was her allergies, so has gotten back on her allergy pills, but since it was not helping started taking mucinex as well, but still waking up very stuffy in the am, and opens back up middle of the day, and gets stuffy in the evening. She is going on a cruise in a few days.     Past Medical History:  Diagnosis Date   Appendicitis 08/16/2016   Arthritis    Atrial tachycardia, paroxysmal (HCC)    a. s/p ablation in 2009 by Dr. Lilian in [facility] (falied); b. Managed w/ beta blocker.   Chronic heart failure with preserved ejection fraction (HFpEF) (HCC)    a. 11/2011 Echo: EF 55-60%; b. 01/2020 Echo: EF 55-60%, no rmwa, nl RV fxn, RVSP . Mildly dil LA. Triv MR.   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   Status post total knee replacement using cement, right 11/06/2018   BMI 60.0-69.9, adult (HCC) 09/12/2018   Sprain of ankle 11/20/2017   Acute left-sided back pain 09/19/2017   Encounter for biometric screening 09/19/2017   Gastroesophageal reflux disease without esophagitis 09/19/2017   Cyst of right ovary 07/03/2017   DUB (dysfunctional uterine bleeding) 07/03/2017   Vaginal mass 07/03/2017   Sleep apnea 06/20/2017   Primary osteoarthritis of left knee 09/19/2016   Appendicitis, acute 08/16/2016   Left  bundle-branch block, unspecified 07/13/2015   Hypertension 07/13/2015   Pain in joint, lower leg 01/12/2014   Atrial tachycardia, paroxysmal (HCC)     Past Surgical History:  Procedure Laterality Date   ABLATION OF DYSRHYTHMIC FOCUS  2009   ANTERIOR CRUCIATE LIGAMENT REPAIR     APPLICATION OF WOUND VAC Right 11/06/2018   Procedure: APPLICATION OF WOUND VAC;  Surgeon: Kathlynn Sharper, MD;  Location: [facility];  Service: Orthopedics;  Laterality: Right;  [id number]   LAPAROSCOPIC APPENDECTOMY N/A 08/16/2016   Procedure: APPENDECTOMY LAPAROSCOPIC;  Surgeon: Charlie FORBES Fell, MD;  Location: [facility];  Service: General;  Laterality: N/A;   TONSILLECTOMY     TOTAL HIP ARTHROPLASTY Left 01/29/2019   Procedure: TOTAL HIP ARTHROPLASTY ANTERIOR APPROACH;  Surgeon: Kathlynn Sharper, MD;  Location: [facility];  Service: Orthopedics;  Laterality: Left;   TOTAL KNEE ARTHROPLASTY Right 11/06/2018   Procedure: RIGHT TOTAL KNEE ARTHROPLASTY;  Surgeon: Kathlynn Sharper, MD;  Location: [facility];  Service: Orthopedics;  Laterality: Right;   TOTAL KNEE ARTHROPLASTY Left 05/26/2019   Procedure: TOTAL KNEE ARTHROPLASTY;  Surgeon: Kathlynn Sharper, MD;  Location: [facility];  Service: Orthopedics;  Laterality: Left;    OB History   No obstetric history on file.      Home Medications    Prior to Admission medications  Medication Sig Start Date End Date Taking? Authorizing Provider  amoxicillin -clavulanate (AUGMENTIN ) 875-125 MG tablet Take 1 tablet by mouth every 12 (twelve) hours. 05/06/23  Yes Rodriguez-Southworth, Kyra, PA-C  methylPREDNISolone  (MEDROL  DOSEPAK) 4 MG TBPK tablet Take 6 tablets (60 mg total) by mouth on day 1, then 5 tablets (50 mg total) on day 2, then 4 tablets (40 mg total) on day 3, then 3 tablets (30 mg total) on day 4, then 2 tablets (20 mg total) on day 5, and then 1 tablet (10 mg total) on day 6. 05/06/23  Yes Rodriguez-Southworth, Lashan Gluth, PA-C  acetaminophen  (TYLENOL ) 500 MG tablet Take 1,000 mg by  mouth every 6 (six) hours as needed for moderate pain or headache.    [provider]  Calcium Carbonate Antacid (CALCIUM CARBONATE PO) Take 1 capsule by mouth daily.    [provider]  carvedilol  (COREG ) 12.5 MG tablet Take 1 tablet (12.5 mg total) by mouth 2 (two) times daily. 09/06/22   Vivienne Lonni Ingle, NP  furosemide  (LASIX ) 20 MG tablet Take 1 tablet (20 mg) by mouth once daily. You may take 1 extra tablet (20 mg) once daily as needed for shortness of breath 09/06/22   Vivienne Lonni Ingle, NP  Naltrexone-buPROPion HCl ER (CONTRAVE) 8-90 MG TB12 Take by mouth. Take 2 tablets by mouth 2 (two) times a day. For weight management 08/14/22   [provider]  pantoprazole  (PROTONIX ) 40 MG tablet TAKE 1 TABLET AT BEDTIME 10/23/21   Dunn, Bernardino HERO, PA-C  Potassium 99 MG TABS Take 99 mg by mouth daily.    [provider]  VITAMIN D , CHOLECALCIFEROL , PO Take 1 capsule by mouth daily.    [provider]    Family History Family History  Problem Relation Age of Onset   Hyperlipidemia Mother    Hypertension Mother    Hypertension Father     Social History Social History   Tobacco Use   Smoking status: Never   Smokeless tobacco: Never  Vaping Use   Vaping status: Never Used  Substance Use Topics   Alcohol use: No   Drug use: No     Allergies   Aspirin   Review of Systems Review of Systems As noted in HPI  Physical Exam Triage Vital Signs ED Triage Vitals  Encounter Vitals Group     BP 05/06/23 0900 (!) 171/81     Systolic BP Percentile --      Diastolic BP Percentile --      Pulse Rate 05/06/23 0900 68     Resp 05/06/23 0900 20     Temp 05/06/23 0900 98 F (36.7 C)     Temp Source 05/06/23 0900 Oral     SpO2 05/06/23 0900 96 %     Weight 05/06/23 0901 (!) 362 lb (164.2 kg)     Height --      Head Circumference --      Peak Flow --      Pain Score 05/06/23 0901 1     Pain Loc --      Pain Education --      Exclude  from Growth Chart --    No data found.  Updated Vital Signs BP (!) 171/81 (BP Location: Right Arm)   Pulse 68   Temp 98 F (36.7 C) (Oral)   Resp 20   Wt (!) 362 lb (164.2 kg)   LMP 04/24/2019 (Approximate)   SpO2 96%   BMI 66.21 kg/m  I repeated  her pulse ox and was 97% Visual Acuity Right Eye Distance:   Left Eye Distance:   Bilateral Distance:    Right Eye Near:   Left Eye Near:    Bilateral Near:     Physical Exam  Physical Exam Vitals signs and nursing note reviewed.  Constitutional:      General: She is not in acute distress.    Appearance: Normal appearance. She is not ill-appearing, toxic-appearing or diaphoretic.  HENT:     Head: Normocephalic.     Right Ear: Tympanic membrane, ear canal and external ear normal.     Left Ear: Tympanic membrane, ear canal and external ear normal.     Nose: with moderate congestion bilaterally. Has mild erythema over L maxillary sinus which is a little tender to palpation. The rest are not tender.     Mouth/Throat: clear    Mouth: Mucous membranes are moist.  Eyes:     General: No scleral icterus.       Right eye: No discharge.        Left eye: No discharge.     Conjunctiva/sclera: Conjunctivae normal.  Neck:     Musculoskeletal: Neck supple. No neck rigidity.  Cardiovascular:     Rate and Rhythm: Normal rate and regular rhythm.     Heart sounds: No murmur.  Pulmonary:     Effort: Pulmonary effort is normal.     Breath sounds: Normal breath sounds.  Musculoskeletal: Normal range of motion.  Lymphadenopathy:     Cervical: No cervical adenopathy.  Skin:    General: Skin is warm and dry.     Coloration: Skin is not jaundiced.     Findings: No rash.  Neurological:     Mental Status: She is alert and oriented to person, place, and time.     Gait: Gait normal.  Psychiatric:        Mood and Affect: Mood normal.        Behavior: Behavior normal.        Thought Content: Thought content normal.        Judgment: Judgment  normal.   UC Treatments / Results  Labs (all labs ordered are listed, but only abnormal results are displayed) Labs Reviewed - No data to display  EKG   Radiology No results found.  Procedures Procedures (including critical care time)  Medications Ordered in UC Medications - No data to display  Initial Impression / Assessment and Plan / UC Course  I have reviewed the triage vital signs and the nursing notes.  Nose Congestion with viral sinusitis  I showed her a video of how to do saline nose rinses and she is willing to try this as noted in instructions. I placed her on Medrol  and if she gets worse or develops a fever, I handed her rx for Augmentin  which she can fill before she leaves town in case she gets worse while in the cruise.     Final Clinical Impressions(s) / UC Diagnoses   Final diagnoses:  Nose congestion     Discharge Instructions      Please do saline rinses twice a day  for 5-7 days, but avoid bed time.  Do not use tap water, only boiled water that has been cooled down.   If after 2-3 days of using current treatment you are not improving or you develop a fever, then start the prescription for the antibiotic.       ED Prescriptions     Medication  Sig Dispense Auth. Provider   methylPREDNISolone  (MEDROL  DOSEPAK) 4 MG TBPK tablet Take 6 tablets (60 mg total) by mouth on day 1, then 5 tablets (50 mg total) on day 2, then 4 tablets (40 mg total) on day 3, then 3 tablets (30 mg total) on day 4, then 2 tablets (20 mg total) on day 5, and then 1 tablet (10 mg total) on day 6. 21 tablet Rodriguez-Southworth, Rosmarie Esquibel, PA-C   amoxicillin -clavulanate (AUGMENTIN ) 875-125 MG tablet Take 1 tablet by mouth every 12 (twelve) hours. 14 tablet Rodriguez-Southworth, Kyra, PA-C      PDMP not reviewed this encounter.   Lindi Kyra, [place] 05/06/23 [phone]

## 2023-05-06 NOTE — Discharge Instructions (Addendum)
 Please do saline rinses twice a day  for 5-7 days, but avoid bed time.  Do not use tap water, only boiled water that has been cooled down.   If after 2-3 days of using current treatment you are not improving or you develop a fever, then start the prescription for the antibiotic.

## 2023-05-22 ENCOUNTER — Ambulatory Visit (HOSPITAL_BASED_OUTPATIENT_CLINIC_OR_DEPARTMENT_OTHER)
Admission: RE | Admit: 2023-05-22 | Discharge: 2023-05-22 | Disposition: A | Discharge status: Home / Self Care | Payer: Managed Care, Other (non HMO) | Source: Ambulatory Visit | Attending: Family Medicine | Admitting: Family Medicine

## 2023-05-22 ENCOUNTER — Encounter (HOSPITAL_BASED_OUTPATIENT_CLINIC_OR_DEPARTMENT_OTHER): Payer: Self-pay

## 2023-05-22 ENCOUNTER — Other Ambulatory Visit (HOSPITAL_BASED_OUTPATIENT_CLINIC_OR_DEPARTMENT_OTHER): Payer: Self-pay

## 2023-05-22 VITALS — BP 142/93 | HR 65 | Temp 98.5°F | Resp 20

## 2023-05-22 DIAGNOSIS — I5032 Chronic diastolic (congestive) heart failure: Secondary | ICD-10-CM | POA: Insufficient documentation

## 2023-05-22 DIAGNOSIS — R6883 Chills (without fever): Secondary | ICD-10-CM | POA: Diagnosis present

## 2023-05-22 DIAGNOSIS — J029 Acute pharyngitis, unspecified: Secondary | ICD-10-CM | POA: Diagnosis present

## 2023-05-22 DIAGNOSIS — Z79899 Other long term (current) drug therapy: Secondary | ICD-10-CM | POA: Insufficient documentation

## 2023-05-22 DIAGNOSIS — J069 Acute upper respiratory infection, unspecified: Secondary | ICD-10-CM | POA: Diagnosis not present

## 2023-05-22 DIAGNOSIS — Z20828 Contact with and (suspected) exposure to other viral communicable diseases: Secondary | ICD-10-CM | POA: Diagnosis present

## 2023-05-22 DIAGNOSIS — R051 Acute cough: Secondary | ICD-10-CM | POA: Diagnosis not present

## 2023-05-22 LAB — POCT INFLUENZA A/B
Influenza A, POC: NEGATIVE
Influenza B, POC: NEGATIVE

## 2023-05-22 MED ORDER — PROMETHAZINE-DM 6.25-15 MG/5ML PO SYRP
5.0000 mL | ORAL_SOLUTION | Freq: Four times a day (QID) | ORAL | 0 refills | Status: DC | PRN
  Filled 2023-05-22: qty 118, 6d supply, fill #0

## 2023-05-22 NOTE — ED Provider Notes (Signed)
[facility]    CSN: [id number] Arrival date & time: 05/22/23  1020      History   Chief Complaint Chief Complaint  Patient presents with   Appointment    HPI [name] [name] is a 53 y.o. female.   Reports sore throat, chills, congestion, ear pain and some sinus pressure.  Symptoms started on Monday, 05/20/2023.  She has a family member who tested positive for influenza yesterday.  She has used OTC Mucinex.  She has just come back from a cruise last weekend.     Past Medical History:  Diagnosis Date   Appendicitis 08/16/2016   Arthritis    Atrial tachycardia, paroxysmal (HCC)    a. s/p ablation in 2009 by Dr. Chales Abrahams in [facility] (falied); b. Managed w/ beta blocker.   Chronic heart failure with preserved ejection fraction (HFpEF) (HCC)    a. 11/2011 Echo: EF 55-60%; b. 01/2020 Echo: EF 55-60%, no rmwa, nl RV fxn, RVSP . Mildly dil LA. Triv MR.   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   Status post total knee replacement using cement, right 11/06/2018   BMI 60.0-69.9, adult (HCC) 09/12/2018   Sprain of ankle 11/20/2017   Acute left-sided back pain 09/19/2017   Encounter for biometric screening 09/19/2017   Gastroesophageal reflux disease without esophagitis 09/19/2017   Cyst of right ovary 07/03/2017   DUB (dysfunctional uterine bleeding) 07/03/2017   Vaginal mass 07/03/2017   Sleep apnea 06/20/2017   Primary osteoarthritis of left knee 09/19/2016   Appendicitis, acute 08/16/2016   Left bundle-branch block, unspecified 07/13/2015   Hypertension 07/13/2015   Pain in joint, lower leg 01/12/2014   Atrial tachycardia, paroxysmal (HCC)     Past Surgical History:  Procedure Laterality Date   ABLATION OF  DYSRHYTHMIC FOCUS  2009   ANTERIOR CRUCIATE LIGAMENT REPAIR     APPLICATION OF WOUND VAC Right 11/06/2018   Procedure: APPLICATION OF WOUND VAC;  Surgeon: Kennedy Bucker, MD;  Location: [facility] ORS;  Service: Orthopedics;  Laterality: Right;  [id number]   LAPAROSCOPIC APPENDECTOMY N/A 08/16/2016   Procedure: APPENDECTOMY LAPAROSCOPIC;  Surgeon: Lattie Haw, MD;  Location: [facility] ORS;  Service: General;  Laterality: N/A;   TONSILLECTOMY     TOTAL HIP ARTHROPLASTY Left 01/29/2019   Procedure: TOTAL HIP ARTHROPLASTY ANTERIOR APPROACH;  Surgeon: Kennedy Bucker, MD;  Location: [facility] ORS;  Service: Orthopedics;  Laterality: Left;   TOTAL KNEE ARTHROPLASTY Right 11/06/2018   Procedure: RIGHT TOTAL KNEE ARTHROPLASTY;  Surgeon: Kennedy Bucker, MD;  Location: [facility] ORS;  Service: Orthopedics;  Laterality: Right;   TOTAL KNEE ARTHROPLASTY Left 05/26/2019   Procedure: TOTAL KNEE ARTHROPLASTY;  Surgeon: Kennedy Bucker, MD;  Location: [facility] ORS;  Service: Orthopedics;  Laterality: Left;    OB History   No obstetric history on file.      Home Medications    Prior to Admission medications   Medication Sig Start Date End Date Taking? Authorizing Provider  promethazine-dextromethorphan (PROMETHAZINE-DM) 6.25-15 MG/5ML syrup Take 5 mLs by mouth 4 (four) times daily as needed. May make drowsy, do no use and drive. 05/22/23  Yes Prescilla Sours, FNP  acetaminophen (TYLENOL) 500 MG tablet Take 1,000 mg by mouth every  6 (six) hours as needed for moderate pain or headache.    [provider]  Calcium Carbonate Antacid (CALCIUM CARBONATE PO) Take 1 capsule by mouth daily.    [provider]  carvedilol (COREG) 12.5 MG tablet Take 1 tablet (12.5 mg total) by mouth 2 (two) times daily. 09/06/22   Creig Hines, NP  furosemide (LASIX) 20 MG tablet Take 1 tablet (20 mg) by mouth once daily. You may take 1 extra tablet (20 mg) once daily as needed for shortness of breath 09/06/22   Creig Hines, NP  Naltrexone-buPROPion HCl ER (CONTRAVE) 8-90 MG TB12 Take by mouth. Take 2 tablets by mouth 2 (two) times a day. For weight management 08/14/22   [provider]  pantoprazole (PROTONIX) 40 MG tablet TAKE 1 TABLET AT BEDTIME 10/23/21   Dunn, Raymon Mutton, PA-C  Potassium 99 MG TABS Take 99 mg by mouth daily.    [provider]  VITAMIN D, CHOLECALCIFEROL, PO Take 1 capsule by mouth daily.    [provider]    Family History Family History  Problem Relation Age of Onset   Hyperlipidemia Mother    Hypertension Mother    Hypertension Father     Social History Social History   Tobacco Use   Smoking status: Never   Smokeless tobacco: Never  Vaping Use   Vaping status: Never Used  Substance Use Topics   Alcohol use: No   Drug use: No     Allergies   Aspirin   Review of Systems Review of Systems  Constitutional:  Negative for chills and fever.  HENT:  Positive for congestion, ear pain, sinus pressure and sore throat.   Eyes:  Negative for pain and visual disturbance.  Respiratory:  Positive for cough. Negative for shortness of breath.   Cardiovascular:  Negative for chest pain and palpitations.  Gastrointestinal:  Negative for abdominal pain, constipation, diarrhea, nausea and vomiting.  Genitourinary:  Negative for dysuria and hematuria.  Musculoskeletal:  Positive for arthralgias. Negative for back pain.  Skin:  Negative for color change and rash.  Neurological:  Negative for seizures and syncope.  All other systems reviewed and are negative.    Physical Exam Triage Vital Signs ED Triage Vitals  Encounter Vitals Group     BP 05/22/23 1032 (!) 172/90     Systolic BP Percentile --      Diastolic BP Percentile --      Pulse Rate 05/22/23 1032 65     Resp 05/22/23 1032 20     Temp 05/22/23 1032 98.5 F (36.9 C)     Temp Source 05/22/23 1032 Oral     SpO2 05/22/23 1032 97 %     Weight --      Height --      Head Circumference --       Peak Flow --      Pain Score 05/22/23 1031 5     Pain Loc --      Pain Education --      Exclude from Growth Chart --    No data found.  Updated Vital Signs BP (!) 142/93   Pulse 65   Temp 98.5 F (36.9 C) (Oral)   Resp 20   LMP 04/24/2019 (Approximate)   SpO2 97%   Visual Acuity Right Eye Distance:   Left Eye Distance:   Bilateral Distance:    Right Eye Near:   Left Eye Near:    Bilateral Near:  Physical Exam Vitals and nursing note reviewed.  Constitutional:      General: She is not in acute distress.    Appearance: She is well-developed.  HENT:     Head: Normocephalic and atraumatic.     Right Ear: Hearing, tympanic membrane, ear canal and external ear normal.     Left Ear: Hearing, tympanic membrane, ear canal and external ear normal.     Nose: Mucosal edema, congestion and rhinorrhea present. Rhinorrhea is clear.     Right Sinus: No maxillary sinus tenderness or frontal sinus tenderness.     Left Sinus: No maxillary sinus tenderness or frontal sinus tenderness.     Mouth/Throat:     Lips: Pink.     Mouth: Mucous membranes are moist.     Pharynx: Uvula midline.     Comments: Tonsils at sent.  No pharyngeal erythema Eyes:     Conjunctiva/sclera: Conjunctivae normal.     Pupils: Pupils are equal, round, and reactive to light.  Cardiovascular:     Rate and Rhythm: Normal rate and regular rhythm.     Heart sounds: S1 normal and S2 normal. No murmur heard. Pulmonary:     Effort: Pulmonary effort is normal. No respiratory distress.     Breath sounds: Normal breath sounds. No decreased breath sounds, wheezing, rhonchi or rales.  Abdominal:     Palpations: Abdomen is soft.     Tenderness: There is no abdominal tenderness.  Musculoskeletal:        General: No swelling.     Cervical back: Neck supple.  Lymphadenopathy:     Head:     Right side of head: No submental, submandibular, tonsillar, preauricular or posterior auricular adenopathy.     Left side of  head: No submental, submandibular, tonsillar, preauricular or posterior auricular adenopathy.     Cervical: No cervical adenopathy.     Right cervical: No superficial cervical adenopathy.    Left cervical: No superficial cervical adenopathy.  Skin:    General: Skin is warm and dry.     Capillary Refill: Capillary refill takes less than 2 seconds.     Findings: No rash.  Neurological:     Mental Status: She is alert and oriented to person, place, and time.  Psychiatric:        Mood and Affect: Mood normal.      UC Treatments / Results  Labs (all labs ordered are listed, but only abnormal results are displayed) Labs Reviewed  POCT INFLUENZA A/B - Normal  RESPIRATORY PANEL BY PCR    EKG   Radiology No results found.  Procedures Procedures (including critical care time)  Medications Ordered in UC Medications - No data to display  Initial Impression / Assessment and Plan / UC Course  I have reviewed the triage vital signs and the nursing notes.  Pertinent labs & imaging results that were available during my care of the patient were reviewed by me and considered in my medical decision making (see chart for details).  Flu is negative.  Respiratory panel sent.  Will adjust her plan of care, if needed once the respiratory panel results.  Promethazine DM, 5 mL, every 6 hours, as needed for cough.  Get plenty of fluids and rest.  Follow-up if symptoms do not improve, worsen or new symptoms occur.  Final Clinical Impressions(s) / UC Diagnoses   Final diagnoses:  Acute cough  Viral URI with cough     Discharge Instructions      Flu is  negative.  Respiratory panel sent.  Will adjust her plan of care, if needed once the respiratory panel results.  Get plenty of fluids and rest.  Provided education about viral upper respiratory infection.  Antibiotics not needed at this time.  Promethazine DM, 5 mL, every 6 hours, as needed for cough.  Follow-up if symptoms do not improve,  worsen or new symptoms occur.     ED Prescriptions     Medication Sig Dispense Auth. Provider   promethazine-dextromethorphan (PROMETHAZINE-DM) 6.25-15 MG/5ML syrup Take 5 mLs by mouth 4 (four) times daily as needed. May make drowsy, do no use and drive. 118 mL Prescilla Sours, FNP      PDMP not reviewed this encounter.   Prescilla Sours, FNP 05/22/23 1121

## 2023-05-22 NOTE — Discharge Instructions (Addendum)
Flu is negative.  Respiratory panel sent.  Will adjust her plan of care, if needed once the respiratory panel results.  Get plenty of fluids and rest.  Provided education about viral upper respiratory infection.  Antibiotics not needed at this time.  Promethazine DM, 5 mL, every 6 hours, as needed for cough.  Follow-up if symptoms do not improve, worsen or new symptoms occur.

## 2023-05-22 NOTE — ED Triage Notes (Signed)
Monday started having sore throat. Yesterday having chills, congestion, sinus pressure and  ear pressure. Family member tested positive for flu yesterday. Took mucinex yesterday and today. Pt reports that she got back from a cruise this weekend.

## 2023-05-23 LAB — RESPIRATORY PANEL BY PCR

## 2023-05-23 NOTE — Progress Notes (Signed)
Respiratory Panel is negative.  Patient updated.  She is not better but also not worse.  She is very congested in her nose.  Follow-up if symptoms do not improve, worsen or new symptoms occur.

## 2023-09-06 ENCOUNTER — Ambulatory Visit: Admitting: Nurse Practitioner

## 2023-10-10 ENCOUNTER — Encounter: Payer: Self-pay | Admitting: Nurse Practitioner

## 2023-10-10 ENCOUNTER — Other Ambulatory Visit: Payer: Self-pay | Admitting: Nurse Practitioner

## 2023-10-10 ENCOUNTER — Ambulatory Visit: Attending: Nurse Practitioner | Admitting: Physician Assistant

## 2023-10-10 VITALS — BP 119/62 | HR 75 | Ht 64.0 in | Wt 391.6 lb

## 2023-10-10 DIAGNOSIS — I5032 Chronic diastolic (congestive) heart failure: Secondary | ICD-10-CM | POA: Diagnosis not present

## 2023-10-10 DIAGNOSIS — I4719 Other supraventricular tachycardia: Secondary | ICD-10-CM | POA: Diagnosis not present

## 2023-10-10 DIAGNOSIS — I10 Essential (primary) hypertension: Secondary | ICD-10-CM

## 2023-10-10 DIAGNOSIS — Z6841 Body Mass Index (BMI) 40.0 and over, adult: Secondary | ICD-10-CM

## 2023-10-10 DIAGNOSIS — I471 Supraventricular tachycardia, unspecified: Secondary | ICD-10-CM

## 2023-10-10 DIAGNOSIS — E782 Mixed hyperlipidemia: Secondary | ICD-10-CM

## 2023-10-10 MED ORDER — CARVEDILOL 12.5 MG PO TABS: 12.5000 mg | ORAL_TABLET | Freq: Two times a day (BID) | ORAL | 3 refills | Status: AC

## 2023-10-10 NOTE — Patient Instructions (Signed)
 Medication Instructions:   Your physician recommends that you continue on your current medications as directed. Please refer to the Current Medication list given to you today.   *If you need a refill on your cardiac medications before your next appointment, please call your pharmacy*  Lab Work:  No labs ordered today   If you have labs (blood work) drawn today and your tests are completely normal, you will receive your results only by: MyChart Message (if you have MyChart) OR A paper copy in the mail If you have any lab test that is abnormal or we need to change your treatment, we will call you to review the results.  Testing/Procedures:  No test ordered today   Follow-Up: At [facility], you and your health needs are our priority.  As part of our continuing mission to provide you with exceptional heart care, our providers are all part of one team.  This team includes your primary Cardiologist (physician) and Advanced Practice Providers or APPs (Physician Assistants and Nurse Practitioners) who all work together to provide you with the care you need, when you need it.  Your next appointment:   12 month(s)  Provider:   You may see Antionette Kirks, MD or one of the following Advanced Practice Providers on your designated Care Team:   Laneta Pintos, NP Gildardo Labrador, PA-C

## 2023-10-10 NOTE — Progress Notes (Signed)
 Cardiology Office Note    Date:  10/10/2023   ID:  [name], [name] [birth year], MRN [id number]  PCP:  Madlyn Schirmer, MD  Cardiologist:  Antionette Kirks, MD  Electrophysiologist:  None   Chief Complaint: Follow up  History of Present Illness:   [name] [name] is a 53 y.o. female with history of SVT and atrial tachycardia s/p unsuccessful ablation in 2009, chronic LBBB, chronic HFpEF, hypertension, obesity, sleep apnea on CPAP who presents for follow up on HFpEF, SVT, and atrial tachycardia.    Patient was diagnosed with atrial tachycardia in 2009 and underwent unsuccessful ablation.  She subsequently developed left bundle branch block.  Historically, palpitations have been managed with carvedilol  therapy.  Most recent echo 01/2020 showed EF 55 to 60% with normal RV function, RVSP of 45 mmHg, and trivial MR.   Patient was most recently seen in clinic 09/06/2022 and overall doing well from a cardiac perspective.  She was working on weight loss with Fredderick Jacquet and is working with the Control and instrumentation engineer.  She was down 26 pounds.  Blood pressure was elevated and it was recommended that she start losartan.  However, patient preferred to follow blood pressure trends at home and was hopeful that weight loss would manage blood pressure without additional medication.  Patient reports to clinic today doing overall well from a cardiac perspective.  She reports that she has been struggling with diet and exercise after recently losing 2 family members.  She admits that she has a stress eater and this recent stress has caused her to gain weight. She remains on Contrave and is working to get back into a routine with diet and exercise.  She endorses intermittent mild lower extremity edema for which she patient only takes an additional 20 mg dose of Lasix  for.  She otherwise denies chest pain, shortness of breath, lightheadedness, dizziness, and palpitations.  Labs independently reviewed: 07/11/2023-NA 137, K4.4,  creatinine 0.9, BUN 14, ALP 118 with otherwise normal LFTs, A1c 5.5, TC 204, TG 151, HDL 65, LDL 113, Hgb 13.9, HCT 41.9, platelet 291  Objective   Past Medical History:  Diagnosis Date   Appendicitis 08/16/2016   Arthritis    Atrial tachycardia, paroxysmal (HCC)    a. s/p ablation in 2009 by Dr. Zackary Heron in [facility] (falied); b. Managed w/ beta blocker.   Chronic heart failure with preserved ejection fraction (HFpEF) (HCC)    a. 11/2011 Echo: EF 55-60%; b. 01/2020 Echo: EF 55-60%, no rmwa, nl RV fxn, RVSP . Mildly dil LA. Triv MR.   GERD (gastroesophageal reflux disease)    Hypertension    LBBB (left bundle branch block)    Obesity    Sleep apnea    cpap    Current Medications: Current Meds  Medication Sig   acetaminophen  (TYLENOL ) 500 MG tablet Take 1,000 mg by mouth every 6 (six) hours as needed for moderate pain or headache.   furosemide  (LASIX ) 20 MG tablet Take 1 tablet (20 mg) by mouth once daily. You may take 1 extra tablet (20 mg) once daily as needed for shortness of breath   Naltrexone-buPROPion HCl ER (CONTRAVE) 8-90 MG TB12 Take by mouth. Take 2 tablets by mouth 2 (two) times a day. For weight management   Potassium 99 MG TABS Take 99 mg by mouth daily.   [DISCONTINUED] carvedilol  (COREG ) 12.5 MG tablet Take 1 tablet (12.5 mg total) by mouth 2 (two) times daily.    Allergies:   Aspirin   Social History  Socioeconomic History   Marital status: Married    Spouse name: Not on file   Number of children: Not on file   Years of education: Not on file   Highest education level: Not on file  Occupational History   Not on file  Tobacco Use   Smoking status: Never   Smokeless tobacco: Never  Vaping Use   Vaping status: Never Used  Substance and Sexual Activity   Alcohol use: No   Drug use: No   Sexual activity: Not on file  Other Topics Concern   Not on file  Social History Narrative   Not on file   Social Drivers of Health   Financial Resource  Strain: Low Risk  (01/13/2020)   Received from [facility] visits prior to 06/23/2022.   Overall Financial Resource Strain (CARDIA)    Difficulty of Paying Living Expenses: Not hard at all  Food Insecurity: Low Risk  (07/11/2023)   Received from [facility]   Hunger Vital Sign    Within the past 12 months, you worried that your food would run out before you got money to buy more: Never true    Within the past 12 months, the food you bought just didn't last and you didn't have money to get more. : Never true  Transportation Needs: No Transportation Needs (07/11/2023)   Received from [facility]    In the past 12 months, has lack of reliable transportation kept you from medical appointments, meetings, work or from getting things needed for daily living? : No  Physical Activity: Insufficiently Active (01/13/2020)   Received from [facility] visits prior to 06/23/2022.   Exercise Vital Sign    Days of Exercise per Week: 2 days    Minutes of Exercise per Session: 20 min  Stress: No Stress Concern Present (01/13/2020)   Received from [facility] visits prior to 06/23/2022.   [employer] of Occupational Health - Occupational Stress Questionnaire    Feeling of Stress : Not at all  Social Connections: Socially Integrated (01/13/2020)   Received from [facility] visits prior to 06/23/2022.   Social Advertising account executive    Frequency of Communication with Friends and Family: More than three times a week    Frequency of Social Gatherings with Friends and Family: Twice a week    Attends Religious Services: More than 4 times per year    Active Member of [employer] or Organizations: Yes    Attends Engineer, structural: More than 4 times per year    Marital Status: Married     Family History:  The patient's family history includes Hyperlipidemia in her mother; Hypertension in her father  and mother.  ROS:   12-point review of systems is negative unless otherwise noted in the HPI.   EKGs/Other Studies Reviewed:    Studies reviewed were summarized above. The additional studies were reviewed today: 02/01/2020 Echo complete 1. Left ventricular ejection fraction, by estimation, is 55 to 60%. The  left ventricle has normal function. The left ventricle has no regional  wall motion abnormalities. Left ventricular diastolic parameters are  indeterminate.   2. Right ventricular systolic function is normal. The right ventricular  size is normal. There is mildly elevated pulmonary artery systolic  pressure. The estimated right ventricular systolic pressure is 45.0 mmHg.   3. Left atrial size was mildly dilated.   4. The mitral valve  is normal in structure. Trivial mitral valve  regurgitation. No evidence of mitral stenosis.   5. The aortic valve is normal in structure. Aortic valve regurgitation is  not visualized. No aortic stenosis is present.   6. The inferior vena cava is dilated in size with >50% respiratory  variability, suggesting right atrial pressure of 8 mmHg.   EKG:  EKG personally reviewed by me today EKG Interpretation Date/Time:  Thursday October 10 2023 11:24:16 EDT Ventricular Rate:  75 PR Interval:  172 QRS Duration:  148 QT Interval:  410 QTC Calculation: 457 R Axis:   -39  Text Interpretation: Sinus rhythm with Premature atrial complexes Left axis deviation Left bundle branch block When compared with ECG of 22-May-2019 09:55, Premature atrial complexes are now Present QRS axis Shifted left Confirmed by Gildardo Labrador ([id number]) on 10/10/2023 11:27:05 AM  PHYSICAL EXAM:    VS:  BP 119/62 (BP Location: Left Wrist, Patient Position: Sitting, Cuff Size: Normal)   Pulse 75   Ht 5' 4 (1.626 m)   Wt (!) 391 lb 9.6 oz (177.6 kg)   LMP 04/24/2019 (Approximate)   SpO2 98%   BMI 67.22 kg/m   BMI: Body mass index is 67.22 kg/m.  Physical Exam Vitals and nursing  note reviewed.  Constitutional:      General: She is not in acute distress.    Appearance: Normal appearance. She is obese.   Cardiovascular:     Rate and Rhythm: Normal rate and regular rhythm.     Heart sounds: No murmur heard. Pulmonary:     Effort: Pulmonary effort is normal. No respiratory distress.     Breath sounds: No wheezing or rales.   Musculoskeletal:     Right lower leg: No edema.     Left lower leg: No edema.   Skin:    General: Skin is warm and dry.   Neurological:     General: No focal deficit present.     Mental Status: She is alert and oriented to person, place, and time. Mental status is at baseline.   Psychiatric:        Mood and Affect: Mood normal.        Behavior: Behavior normal.    Wt Readings from Last 3 Encounters:  10/10/23 (!) 391 lb 9.6 oz (177.6 kg)  05/06/23 (!) 362 lb (164.2 kg)  09/06/22 (!) 386 lb 9.6 oz (175.4 kg)       ASSESSMENT & PLAN:   PSVT Atrial tachycardia - Maintaining sinus rhythm.  Quiescent on carvedilol .  Essential hypertension - Blood pressure well-managed.  Continue carvedilol  and furosemide .  Chronic HFpEF - Most recent echo 01/2020 with EF 55 to 60%.  Appears well compensated and euvolemic on exam.  She is continued on Lasix  20 mg daily with additional 20 mg daily dose as needed for shortness of breath/lower extremity edema.  Morbid obesity - Follows with PCP for weight management.  Currently on Contrave, although recently with significant weight gain due to stress.  She is working towards getting back on her routine with diet and exercise.  Hyperlipidemia - Most recent lipid panel 06/2023 with LDL 113, above goal.  She will continue to work on diet and exercise for weight loss as above.   Disposition: F/u with Dr. Alvenia Aus or an APP in 1 year.   Medication Adjustments/Labs and Tests Ordered: Current medicines are reviewed at length with the patient today.  Concerns regarding medicines are outlined above.  Medication changes, Labs and Tests ordered  today are summarized above and listed in the Patient Instructions accessible in Encounters.   Beather Liming, PA-C 10/10/2023 4:45 PM     [facility] [place] [address] [place], [place] [address] [phone]

## 2024-05-22 ENCOUNTER — Encounter: Payer: Self-pay | Admitting: Cardiovascular Disease

## 2024-05-22 MED ORDER — FUROSEMIDE 20 MG PO TABS: ORAL_TABLET | ORAL | 1 refills | Status: AC
